# Patient Record
Sex: Male | Born: 1980 | Race: White | Hispanic: No | Marital: Married | Smoking: Never smoker
Health system: Southern US, Community
[De-identification: ages and names within clinical notes are randomized; demographics above are authoritative.]

## PROBLEM LIST (undated history)

## (undated) DIAGNOSIS — I10 Essential (primary) hypertension: Secondary | ICD-10-CM

## (undated) DIAGNOSIS — E7800 Pure hypercholesterolemia, unspecified: Secondary | ICD-10-CM

## (undated) DIAGNOSIS — C9590 Leukemia, unspecified not having achieved remission: Secondary | ICD-10-CM

---

## 2017-08-23 ENCOUNTER — Encounter: Payer: Self-pay | Admitting: Hematology and Oncology

## 2017-08-23 ENCOUNTER — Telehealth: Payer: Self-pay | Admitting: Hematology and Oncology

## 2017-08-23 NOTE — Telephone Encounter (Signed)
Appt has been scheduled for the pt to see Dr. Lindi Adie on 3/21 at 345pm. Pt's address has been verified. Letter mailed.

## 2017-09-21 ENCOUNTER — Inpatient Hospital Stay: Payer: Managed Care, Other (non HMO)

## 2017-09-21 ENCOUNTER — Telehealth: Payer: Self-pay | Admitting: Hematology and Oncology

## 2017-09-21 ENCOUNTER — Inpatient Hospital Stay: Payer: Managed Care, Other (non HMO) | Attending: Hematology and Oncology | Admitting: Hematology and Oncology

## 2017-09-21 DIAGNOSIS — D72820 Lymphocytosis (symptomatic): Secondary | ICD-10-CM | POA: Diagnosis not present

## 2017-09-21 DIAGNOSIS — E669 Obesity, unspecified: Secondary | ICD-10-CM | POA: Diagnosis not present

## 2017-09-21 DIAGNOSIS — F418 Other specified anxiety disorders: Secondary | ICD-10-CM

## 2017-09-21 DIAGNOSIS — Z79899 Other long term (current) drug therapy: Secondary | ICD-10-CM | POA: Diagnosis not present

## 2017-09-21 DIAGNOSIS — E7800 Pure hypercholesterolemia, unspecified: Secondary | ICD-10-CM

## 2017-09-21 DIAGNOSIS — I10 Essential (primary) hypertension: Secondary | ICD-10-CM

## 2017-09-21 DIAGNOSIS — R599 Enlarged lymph nodes, unspecified: Secondary | ICD-10-CM | POA: Diagnosis not present

## 2017-09-21 LAB — CBC WITH DIFFERENTIAL (CANCER CENTER ONLY)
BASOS PCT: 0 %
Basophils Absolute: 0 10*3/uL (ref 0.0–0.1)
EOS ABS: 0.3 10*3/uL (ref 0.0–0.5)
Eosinophils Relative: 1 %
HEMATOCRIT: 45.9 % (ref 38.4–49.9)
Hemoglobin: 15.2 g/dL (ref 13.0–17.1)
Lymphocytes Relative: 68 %
Lymphs Abs: 15.9 10*3/uL — ABNORMAL HIGH (ref 0.9–3.3)
MCH: 29.7 pg (ref 27.2–33.4)
MCHC: 33.1 g/dL (ref 32.0–36.0)
MCV: 89.7 fL (ref 79.3–98.0)
MONO ABS: 1.1 10*3/uL — AB (ref 0.1–0.9)
MONOS PCT: 5 %
NEUTROS ABS: 6.1 10*3/uL (ref 1.5–6.5)
Neutrophils Relative %: 26 %
PLATELETS: 250 10*3/uL (ref 140–400)
RBC: 5.12 MIL/uL (ref 4.20–5.82)
RDW: 13.5 % (ref 11.0–14.6)
WBC Count: 23.4 10*3/uL — ABNORMAL HIGH (ref 4.0–10.3)

## 2017-09-21 NOTE — Progress Notes (Signed)
[facility] NOTE  Patient Care Team: [name], MD as PCP - General (Family Medicine)  CHIEF COMPLAINTS/PURPOSE OF CONSULTATION:  Lymphocytosis  HISTORY OF PRESENTING ILLNESS:  Jordan Schroeder 37 y.o. male is here because of recent diagnosis of elevated white blood cell count primarily lymphocytosis.  Patient was asymptomatic and went for primary care checkup and was found to have elevated WBC count.  He had repeat testing and was consistently shown to have elevated white blood cells.  He also has mildly enlarged cervical lymph nodes.  Because of this he was referred to [place] for further evaluation.  He denies any fevers chills night sweats or weight loss.  I reviewed her records extensively and collaborated the history with the patient.  MEDICAL HISTORY: Obesity, hypertension, hypercholesterolemia, depression, generalized anxiety disorder SURGICAL HISTORY: No prior surgeries SOCIAL HISTORY: Denies any tobacco alcohol or recreational drug use FAMILY HISTORY: No family history of any cancers or blood disorders ALLERGIES:  has no allergies on file. MEDICATIONS: Fenofibrate 160 mg daily, atorvastatin 10 mg daily, lisinopril 10 mg daily, Septra 100 mg daily REVIEW OF SYSTEMS:    Constitutional: Denies fevers, chills or abnormal night sweats Eyes: Denies blurriness of vision, double vision or watery eyes Ears, nose, mouth, throat, and face: Denies mucositis or sore throat Respiratory: Denies cough, dyspnea or wheezes Cardiovascular: Denies palpitation, chest discomfort or lower extremity swelling Gastrointestinal:  Denies nausea, heartburn or change in bowel habits Skin: Denies abnormal skin rashes Lymphatics: Denies new lymphadenopathy or easy bruising Neurological:Denies numbness, tingling or new weaknesses Behavioral/Psych: Mood is stable, no new changes   All other systems were reviewed with the patient and are negative.  PHYSICAL EXAMINATION: ECOG  PERFORMANCE STATUS: 1 - Symptomatic but completely ambulatory  Vitals:   09/21/17 1557  BP: (!) 146/92  Pulse: 93  Resp: 18  Temp: 98.3 F (36.8 C)  SpO2: 97%   Filed Weights   09/21/17 1557  Weight: 285 lb (129.3 kg)    GENERAL:alert, no distress and comfortable SKIN: skin color, texture, turgor are normal, no rashes or significant lesions EYES: normal, conjunctiva are pink and non-injected, sclera clear OROPHARYNX:no exudate, no erythema and lips, buccal mucosa, and tongue normal  NECK: supple, thyroid normal size, non-tender, without nodularity LYMPH: Small left cervical lymph node is palpable l LUNGS: clear to auscultation and percussion with normal breathing effort HEART: regular rate & rhythm and no murmurs and no lower extremity edema ABDOMEN:abdomen soft, non-tender and normal bowel sounds Musculoskeletal:no cyanosis of digits and no clubbing  PSYCH: alert & oriented x 3 with fluent speech NEURO: no focal motor/sensory deficits  LABORATORY DATA:  I have reviewed the data as listed Lab Results  Component Value Date   WBC 23.4 (H) 09/21/2017   HCT 45.9 09/21/2017   MCV 89.7 09/21/2017   PLT 250 09/21/2017   No results found for: NA, K, CL, CO2  RADIOGRAPHIC STUDIES: I have personally reviewed the radiological reports and agreed with the findings in the report.  ASSESSMENT AND PLAN:  Lymphocytosis Lymphocytosis:  08/02/2016: WBC 11.4 with ALC 6.3 08/07/2017: WBC 18.6 with ALC 12.6 08/16/2017: WBC 20.3 ALC 13.3, hemoglobin 15.4, platelets 305 09/21/2017: WBC 23.4 with ANC of 15.9 I discussed with the patient the most common etiology behind lymphocytosis that is asymptomatic is most likely chronic lymphocytic leukemia.  Plan: 1.  Flow cytometry for B cell subsets 2. FISH panel for CLL  I discussed the patient with CLL is an indolent leukemia one that  does not require treatment unless patient is symptomatic. Indications for treatment include 1.  Rapid doubling  of lymphocytes less than 3 months 2. B symptoms of fever chills night sweats weight loss 3.  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 Prognosis of CLL would be determined based upon the [facility] panel.  Return to clinic in 2-3 weeks to discuss results of testing done today.   All questions were answered. The patient knows to call the clinic with any problems, questions or concerns.    Harriette Ohara, MD 09/21/17

## 2017-09-21 NOTE — Assessment & Plan Note (Signed)
Lymphocytosis: 08/16/2017: WBC 20.3 ALC 13.3, hemoglobin 15.4, platelets 305 08/02/2016: WBC 11.4 with ALC 6.3 08/07/2017: WBC 18.6 with ALC 12.6  I discussed with the patient the most common etiology behind lymphocytosis that is asymptomatic is most likely chronic lymphocytic leukemia.  Plan: 1.  Flow cytometry for B cell subsets 2. FISH panel for CLL  I discussed the patient with CLL is an indolent leukemia one that does not require treatment unless patient is symptomatic. Indications for treatment include 1.  Rapid doubling of lymphocytes less than 3 months 2. B symptoms of fever chills night sweats weight loss 3.  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 Prognosis of CLL would be determined based upon the [facility] panel.  Return to clinic in 3 weeks to discuss results of testing done today.

## 2017-09-21 NOTE — Telephone Encounter (Signed)
Gave avs and calendar ° °

## 2017-09-22 LAB — LACTATE DEHYDROGENASE: LDH: 214 U/L (ref 125–245)

## 2017-09-26 LAB — FLOW CYTOMETRY

## 2017-10-05 ENCOUNTER — Telehealth: Payer: Self-pay | Admitting: Hematology

## 2017-10-05 ENCOUNTER — Inpatient Hospital Stay: Payer: Managed Care, Other (non HMO)

## 2017-10-05 ENCOUNTER — Telehealth: Payer: Self-pay | Admitting: Hematology and Oncology

## 2017-10-05 ENCOUNTER — Inpatient Hospital Stay: Payer: Managed Care, Other (non HMO) | Attending: Hematology and Oncology | Admitting: Hematology and Oncology

## 2017-10-05 DIAGNOSIS — D696 Thrombocytopenia, unspecified: Secondary | ICD-10-CM

## 2017-10-05 DIAGNOSIS — R634 Abnormal weight loss: Secondary | ICD-10-CM

## 2017-10-05 DIAGNOSIS — Z79899 Other long term (current) drug therapy: Secondary | ICD-10-CM | POA: Diagnosis not present

## 2017-10-05 DIAGNOSIS — D72820 Lymphocytosis (symptomatic): Secondary | ICD-10-CM | POA: Diagnosis not present

## 2017-10-05 DIAGNOSIS — R509 Fever, unspecified: Secondary | ICD-10-CM | POA: Diagnosis not present

## 2017-10-05 DIAGNOSIS — R590 Localized enlarged lymph nodes: Secondary | ICD-10-CM | POA: Insufficient documentation

## 2017-10-05 DIAGNOSIS — I10 Essential (primary) hypertension: Secondary | ICD-10-CM | POA: Diagnosis not present

## 2017-10-05 DIAGNOSIS — D649 Anemia, unspecified: Secondary | ICD-10-CM | POA: Diagnosis not present

## 2017-10-05 DIAGNOSIS — C9110 Chronic lymphocytic leukemia of B-cell type not having achieved remission: Secondary | ICD-10-CM | POA: Diagnosis not present

## 2017-10-05 DIAGNOSIS — R61 Generalized hyperhidrosis: Secondary | ICD-10-CM | POA: Diagnosis not present

## 2017-10-05 NOTE — Telephone Encounter (Signed)
Spoke to patient regarding upcoming April appointments. Appointment 4/12 ok per provider.

## 2017-10-05 NOTE — Progress Notes (Signed)
Patient Care Team: [name], MD as PCP - General (Family Medicine)  DIAGNOSIS:  Encounter Diagnosis  Name Primary?  . Lymphocytosis     CHIEF COMPLIANT: Follow-up to discuss the results of recently performed blood work  INTERVAL HISTORY: Jordan Schroeder is a 37 year old gentleman with elevation of lymphocyte count whom we performed blood work including flow cytometry and is here today to discuss the results.  The flow cytometry revealed that he had a monoclonal B-cell population that had CD20 and CD5 positivity suggestive of CLL versus mantle cell lymphoma.  He is here accompanied by his family to discuss the results.  REVIEW OF SYSTEMS:   Constitutional: Denies fevers, chills or abnormal weight loss Eyes: Denies blurriness of vision Ears, nose, mouth, throat, and face: Denies mucositis or sore throat Respiratory: Denies cough, dyspnea or wheezes Cardiovascular: Denies palpitation, chest discomfort Gastrointestinal:  Denies nausea, heartburn or change in bowel habits Skin: Denies abnormal skin rashes Lymphatics: Denies new lymphadenopathy or easy bruising Neurological:Denies numbness, tingling or new weaknesses Behavioral/Psych: Mood is stable, no new changes  Extremities: No lower extremity edema  All other systems were reviewed with the patient and are negative.  I have reviewed the past medical history, past surgical history, social history and family history with the patient and they are unchanged from previous note.  ALLERGIES:  has no allergies on file.  MEDICATIONS:  No current outpatient medications on file.   No current facility-administered medications for this visit.     PHYSICAL EXAMINATION: ECOG PERFORMANCE STATUS: 1 - Symptomatic but completely ambulatory  Vitals:   10/05/17 1133  BP: 114/84  Pulse: 88  Resp: 18  Temp: 98.4 F (36.9 C)  SpO2: 98%   Filed Weights   10/05/17 1133  Weight: 281 lb 8 oz (127.7 kg)    GENERAL:alert, no  distress and comfortable SKIN: skin color, texture, turgor are normal, no rashes or significant lesions EYES: normal, Conjunctiva are pink and non-injected, sclera clear OROPHARYNX:no exudate, no erythema and lips, buccal mucosa, and tongue normal  NECK: supple, thyroid normal size, non-tender, without nodularity LYMPH:  no palpable lymphadenopathy in the cervical, axillary or inguinal LUNGS: clear to auscultation and percussion with normal breathing effort HEART: regular rate & rhythm and no murmurs and no lower extremity edema ABDOMEN:abdomen soft, non-tender and normal bowel sounds MUSCULOSKELETAL:no cyanosis of digits and no clubbing  NEURO: alert & oriented x 3 with fluent speech, no focal motor/sensory deficits EXTREMITIES: No lower extremity edema   LABORATORY DATA:  I have reviewed the data as listed No flowsheet data found.  Lab Results  Component Value Date   WBC 23.4 (H) 09/21/2017   HCT 45.9 09/21/2017   MCV 89.7 09/21/2017   PLT 250 09/21/2017   NEUTROABS 6.1 09/21/2017    ASSESSMENT & PLAN:  Lymphocytosis 08/02/2016: WBC 11.4 with ALC 6.3 08/07/2017: WBC 18.6 with ALC 12.6 08/16/2017: WBC 20.3 ALC 13.3, hemoglobin 15.4, platelets 305 09/21/2017: WBC 23.4 with ANC of 15.9 I discussed with the patient the most common etiology behind lymphocytosis that is asymptomatic is most likely chronic lymphocytic leukemia.  Plan: 1.  Flow cytometry for B cell subsets: Lymphoid population is monoclonal kappa restricted expressing B-cell antigens CD20, CD23 and CD5.  CD10 negative.  Consistent with CLL or mantle cell lymphoma. 2. FISH panel for CLL: Pending 3. In order to differentiate CLL and mantle cell lymphoma, I ordered FISH panel for 11:14 translocation. 4. I also ordered for CT chest abdomen pelvi  For lymphoma  staging   I discussed the patient with CLL/ most MCLs are an indolent that may not require treatment unless patient is symptomatic. Indications for treatment  include 1.  Rapid doubling of lymphocytes less than 3 months 2. B symptoms of fever chills night sweats weight loss 3.  Anemia and thrombocytopenia  I discussed the case with Dr.Kale who agreed to see the patient after he undergoes the CT scans and additional blood work. Patient will be followed by Dr. Irene Limbo in the future.    Orders Placed This Encounter  Procedures  . CT Abdomen Pelvis W Contrast    Standing Status:   Future    Standing Expiration Date:   10/05/2018    Order Specific Question:   If indicated for the ordered procedure, I authorize the administration of contrast media per Radiology protocol    Answer:   Yes    Order Specific Question:   Preferred imaging location?    Answer:   [facility]    Order Specific Question:   Call Results- Best Contact Number?    Answer:   Lymphoma staging    Order Specific Question:   Radiology Contrast Protocol - do NOT remove file path    Answer:   \\[email or web address]  . CT Chest W Contrast    Standing Status:   Future    Standing Expiration Date:   10/05/2018    Order Specific Question:   If indicated for the ordered procedure, I authorize the administration of contrast media per Radiology protocol    Answer:   Yes    Order Specific Question:   Preferred imaging location?    Answer:   [facility]    Order Specific Question:   Radiology Contrast Protocol - do NOT remove file path    Answer:   [email or web address]    Order Specific Question:   Reason for Exam additional comments    Answer:   Lymphoma staging  . [facility]    For Mantle cell Lymphoma    Standing Status:   Future    Number of Occurrences:   1    Standing Expiration Date:   10/06/2018   The patient has a good understanding of the overall plan. he agrees with it. he will call with any problems that may develop before the next visit here.   Harriette Ohara, MD 10/05/17

## 2017-10-05 NOTE — Telephone Encounter (Signed)
Gave patient AVs and calendar of upcoming April appointments. Patient will be scheduled with provider per 4/4 los at next avail appointment.

## 2017-10-05 NOTE — Assessment & Plan Note (Signed)
08/02/2016: WBC 11.4 with ALC 6.3 08/07/2017: WBC 18.6 with ALC 12.6 08/16/2017: WBC 20.3 ALC 13.3, hemoglobin 15.4, platelets 305 09/21/2017: WBC 23.4 with ANC of 15.9 I discussed with the patient the most common etiology behind lymphocytosis that is asymptomatic is most likely chronic lymphocytic leukemia.  Plan: 1.  Flow cytometry for B cell subsets: Lymphoid population is monoclonal kappa restricted expressing B-cell antigens CD20, CD23 and CD5.  CD10 negative.  Consistent with CLL or mantle cell lymphoma. 2. FISH panel for CLL: Pending  I discussed the patient with CLL is an indolent leukemia one that does not require treatment unless patient is symptomatic. Indications for treatment include 1.  Rapid doubling of lymphocytes less than 3 months 2. B symptoms of fever chills night sweats weight loss 3.  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 Prognosis of CLL would be determined based upon the [facility] panel.  Treatment plan: Watchful monitoring Return to clinic in 3 months with labs and follow-up  Return to clinic in 2-3 weeks to discuss results of testing done today.

## 2017-10-11 ENCOUNTER — Encounter (HOSPITAL_COMMUNITY): Payer: Self-pay

## 2017-10-11 ENCOUNTER — Ambulatory Visit (HOSPITAL_COMMUNITY)
Admission: RE | Admit: 2017-10-11 | Discharge: 2017-10-11 | Disposition: A | Discharge status: Home / Self Care | Payer: Managed Care, Other (non HMO) | Source: Ambulatory Visit | Attending: Hematology and Oncology | Admitting: Hematology and Oncology

## 2017-10-11 DIAGNOSIS — R590 Localized enlarged lymph nodes: Secondary | ICD-10-CM | POA: Insufficient documentation

## 2017-10-11 DIAGNOSIS — D72820 Lymphocytosis (symptomatic): Secondary | ICD-10-CM | POA: Insufficient documentation

## 2017-10-11 HISTORY — DX: Essential (primary) hypertension: I10

## 2017-10-11 MED ORDER — IOHEXOL 300 MG/ML  SOLN
100.0000 mL | Freq: Once | INTRAMUSCULAR | Status: AC | PRN
  Administered 2017-10-11: 100 mL via INTRAVENOUS

## 2017-10-12 NOTE — Progress Notes (Signed)
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 He is accompanied today by his mother. The pt reports that he is doing well overall.   The pt reports that he has not had any significant medical problems in the past. He notes that on an annual physical, his PCP noticed his WBC was high, and after repeat blood tests, he was referred to Dr. Nicholas Lose. Dr. Lindi Adie has already begun the initial work ups as noted below. The pt notes that he does not notice any change in his energy levels or feeling any different over the last 6 months.   He notes that he has some concerns for sleep apnea as he snores a lot at night but does not wake up due to SOB.   Of note prior to the patient's visit today, pt has had CT C/A/P completed on 10/11/17 with results revealing Lymphadenopathy throughout the chest, abdomen, and pelvis.   On 09/21/17 the pt also had a Flow Cytometry revealing MONOCLONAL B-CELL POPULATION IDENTIFIED.  Most recent lab results (09/21/17) of CBC  is as follows: all values are WNL except for WBC at 23.4k, Lymphs Abs at 15.9k, Monocytes Abs at 1.1k. LDH 09/21/17 was WNL at 214. FISH 09/21/12 is pending.  [facility] 10/05/17 revealed CEP 12 at 100%,  13q14.3 is at 47.67% for normal nuclei, and 47.99% with mono allelic deletion of [id number]. CL TP53 for 17p13 revealed 100% with normal nuclei.  ATM for 11q22.3 revealed 81.67% with normal nuclei and 18.33% with positive nuclei for del 11q22.3  On review of systems, pt reports some lower abdominal pain, good energy levels, and denies fevers, chills, night sweats, weight loss, diarrhea, blood in  the stools, black stools, problems passing urine, and any other symptoms.   On PMHx the pt reports HTN. . On Social Hx the pt denies smoking at any point, any ETOH consumption, and illicit drug use.    MEDICAL HISTORY:  Past Medical History:  Diagnosis Date  . Hypertension     SURGICAL HISTORY: No past surgical history on file.  SOCIAL HISTORY: Social History   Socioeconomic History  . Marital status: Married    Spouse name: Not on file  . Number of children: Not on file  . Years of education: Not on file  . Highest education level: Not on file  Occupational History  . Not on file  Social Needs  . Financial resource strain: Not on file  . Food insecurity:    Worry: Not on file    Inability: Not on file  . Transportation needs:    Medical: Not on file    Non-medical: Not on file  Tobacco Use  . Smoking status: Not on file  Substance and Sexual Activity  . Alcohol use: Not on file  . Drug use: Not on file  . Sexual activity: Not on file  Lifestyle  . Physical activity:    Days per week: Not on file    Minutes per session: Not on file  . Stress: Not on file  Relationships  . Social connections:    Talks on phone: Not on file  Gets together: Not on file    Attends religious service: Not on file    Active member of club or organization: Not on file    Attends meetings of clubs or organizations: Not on file    Relationship status: Not on file  . Intimate partner violence:    Fear of current or ex partner: Not on file    Emotionally abused: Not on file    Physically abused: Not on file    Forced sexual activity: Not on file  Other Topics Concern  . Not on file  Social History Narrative  . Not on file    FAMILY HISTORY: No family history on file.  ALLERGIES:  has No Known Allergies.  MEDICATIONS:  Current Outpatient Medications  Medication Sig Dispense Refill  . lisinopril (PRINIVIL,ZESTRIL) 10 MG tablet Take 10 mg by mouth daily.    . sertraline  (ZOLOFT) 100 MG tablet Take 100 mg by mouth daily.     No current facility-administered medications for this visit.     REVIEW OF SYSTEMS:    10 Point review of Systems was done is negative except as noted above.  PHYSICAL EXAMINATION: ECOG PERFORMANCE STATUS: 1 - Symptomatic but completely ambulatory  . Vitals:   10/13/17 1249  BP: 132/86  Pulse: 90  Resp: 19  Temp: 98.4 F (36.9 C)  SpO2: 95%   Filed Weights   10/13/17 1249  Weight: 282 lb 9.6 oz (128.2 kg)   .Body mass index is 42.97 kg/m.  GENERAL:alert, in no acute distress and comfortable SKIN: no acute rashes, no significant lesions EYES: conjunctiva are pink and non-injected, sclera anicteric OROPHARYNX: MMM, no exudates, no oropharyngeal erythema or ulceration NECK: supple, no JVD LYMPH:  no palpable lymphadenopathy in the cervical, axillary or inguinal regions LUNGS: clear to auscultation b/l with normal respiratory effort HEART: regular rate & rhythm ABDOMEN:  normoactive bowel sounds , non tender, not distended. Extremity: no pedal edema PSYCH: alert & oriented x 3 with fluent speech NEURO: no focal motor/sensory deficits  LABORATORY DATA:  I have reviewed the data as listed  . CBC Latest Ref Rng & Units 09/21/2017  WBC 4.0 - 10.3 K/uL 23.4(H)  Hematocrit 38.4 - 49.9 % 45.9  Platelets 140 - 400 K/uL 250    .No flowsheet data found.  09/21/17 Bx  RADIOGRAPHIC STUDIES: I have personally reviewed the radiological images as listed and agreed with the findings in the report. 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 Lymphocytosis. EXAM: CT CHEST, ABDOMEN, AND PELVIS WITH CONTRAST TECHNIQUE: Multidetector CT imaging of the chest, abdomen and pelvis was performed following the standard protocol during bolus administration of intravenous contrast. CONTRAST:  180m OMNIPAQUE IOHEXOL 300 MG/ML  SOLN COMPARISON:  None.  FINDINGS: CT CHEST FINDINGS Cardiovascular: No acute findings. Mediastinum/Lymph Nodes: Mild lymphadenopathy is seen in the bilateral supraclavicular and axillary regions, mediastinum, and right hilum. Lungs/Pleura: No pulmonary infiltrate or mass identified. No effusion present. Musculoskeletal:  No suspicious bone lesions identified. CT ABDOMEN AND PELVIS FINDINGS Hepatobiliary: No masses identified. Gallbladder is unremarkable. Pancreas:  No mass or inflammatory changes. Spleen:  Within normal limits in size and appearance. Adrenals/Urinary tract:  No masses or hydronephrosis. Stomach/Bowel: No evidence of obstruction, inflammatory process, or abnormal fluid collections. Vascular/Lymphatic: Bulky lymphadenopathy is seen in the porta hepatis, gastrohepatic ligament, and peripancreatic regions. Largest lymph node in the right upper quadrant measures 8.9 cm in short axis on  image 73/2. Mild lymphadenopathy is also seen in the gastrosplenic ligament, small bowel mesentery, and retroperitoneum in the aortocaval and left paraaortic regions. Pelvic lymphadenopathy is also seen throughout the bilateral iliac chains, with largest index lymph node in the left iliac chain measuring 3.5 cm in short axis on image 116/2. Multiple small bilateral inguinal lymph nodes are also noted. Reproductive:  No mass or other significant abnormality identified. Other:  None. Musculoskeletal:  No suspicious bone lesions identified. IMPRESSION: Lymphadenopathy throughout the chest, abdomen, and pelvis, as described above. Electronically Signed   By: Earle Gell M.D.   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 Lymphocytosis. EXAM: CT CHEST, ABDOMEN, AND PELVIS WITH CONTRAST TECHNIQUE: Multidetector CT imaging of the chest, abdomen and pelvis was performed following the standard protocol during bolus administration of  intravenous contrast. CONTRAST:  161m OMNIPAQUE IOHEXOL 300 MG/ML  SOLN COMPARISON:  None. FINDINGS: CT CHEST FINDINGS Cardiovascular: No acute findings. Mediastinum/Lymph Nodes: Mild lymphadenopathy is seen in the bilateral supraclavicular and axillary regions, mediastinum, and right hilum. Lungs/Pleura: No pulmonary infiltrate or mass identified. No effusion present. Musculoskeletal:  No suspicious bone lesions identified. CT ABDOMEN AND PELVIS FINDINGS Hepatobiliary: No masses identified. Gallbladder is unremarkable. Pancreas:  No mass or inflammatory changes. Spleen:  Within normal limits in size and appearance. Adrenals/Urinary tract:  No masses or hydronephrosis. Stomach/Bowel: No evidence of obstruction, inflammatory process, or abnormal fluid collections. Vascular/Lymphatic: Bulky lymphadenopathy is seen in the porta hepatis, gastrohepatic ligament, and peripancreatic regions. Largest lymph node in the right upper quadrant measures 8.9 cm in short axis on image 73/2. Mild lymphadenopathy is also seen in the gastrosplenic ligament, small bowel mesentery, and retroperitoneum in the aortocaval and left paraaortic regions. Pelvic lymphadenopathy is also seen throughout the bilateral iliac chains, with largest index lymph node in the left iliac chain measuring 3.5 cm in short axis on image 116/2. Multiple small bilateral inguinal lymph nodes are also noted. Reproductive:  No mass or other significant abnormality identified. Other:  None. Musculoskeletal:  No suspicious bone lesions identified. IMPRESSION: Lymphadenopathy throughout the chest, abdomen, and pelvis, as described above. Electronically Signed   By: JEarle GellM.D.   On: 10/11/2017 16:36    ASSESSMENT & PLAN:  37y.o. male with  1. 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  -Reviewed 10/11/17 CT of C/A/P which revealed lymphadenopathy throughout the chest, abdomen and pelvis. Most notably in the axilla and abdomen.  PLAN -Would recommend a [place] biopsy -Would recommend a PET/CT to get a baseline -Discussed that if FISH for Cyclin D pending to r/o MCL   -Noted 13q deletion: 13q14.3 is at 47.67% for normal nuclei, and 316.10%with mono allelic deletion of [id number]  -ATM for 11q22.3 revealed 81.67% with normal nuclei and 18.33% with positive nuclei for del 11q22.3 ---> point more to CLL/SLL  -Will refer pt to rad onc for LN biopsy -Will evaluate for hepatitis and HIV from a treatment planning standpoint   [place] biopsy of supraclavicular or axillary LN in 1 week PET/CT in 1 week RTC with Dr KIrene Limboon 10/30/2017 with above results with labs (ok to overbook)   All of  the patients questions were answered with apparent satisfaction. The patient knows to call the clinic with any problems, questions or concerns.  I spent 30 minutes counseling the patient face to face. The total time spent in the appointment was 40 minutes and more than 50% was on counseling and direct patient cares.    Sullivan Lone MD MS AAHIVMS [facility] Hematology/Oncology Physician [facility]  (Office):       [phone] (Work cell):  [phone] (Fax):           [phone]  10/13/2017 12:49 PM  This document serves as a record of services personally performed by Sullivan Lone, MD. It was created on his behalf by Baldwin Jamaica, a trained medical scribe. The creation of this record is based on the scribe's personal observations and the provider's statements to them.   .I have reviewed the above documentation for accuracy and completeness, and I agree with the above. Brunetta Genera MD MS

## 2017-10-13 ENCOUNTER — Inpatient Hospital Stay (HOSPITAL_BASED_OUTPATIENT_CLINIC_OR_DEPARTMENT_OTHER): Payer: Managed Care, Other (non HMO) | Admitting: Hematology

## 2017-10-13 ENCOUNTER — Telehealth: Payer: Self-pay | Admitting: Hematology

## 2017-10-13 VITALS — BP 132/86 | HR 90 | Temp 98.4°F | Resp 19 | Ht 68.0 in | Wt 282.6 lb

## 2017-10-13 DIAGNOSIS — D649 Anemia, unspecified: Secondary | ICD-10-CM | POA: Diagnosis not present

## 2017-10-13 DIAGNOSIS — I10 Essential (primary) hypertension: Secondary | ICD-10-CM | POA: Diagnosis not present

## 2017-10-13 DIAGNOSIS — D696 Thrombocytopenia, unspecified: Secondary | ICD-10-CM

## 2017-10-13 DIAGNOSIS — Z79899 Other long term (current) drug therapy: Secondary | ICD-10-CM | POA: Diagnosis not present

## 2017-10-13 DIAGNOSIS — D72820 Lymphocytosis (symptomatic): Secondary | ICD-10-CM | POA: Diagnosis not present

## 2017-10-13 DIAGNOSIS — R61 Generalized hyperhidrosis: Secondary | ICD-10-CM

## 2017-10-13 DIAGNOSIS — C9110 Chronic lymphocytic leukemia of B-cell type not having achieved remission: Secondary | ICD-10-CM | POA: Diagnosis not present

## 2017-10-13 DIAGNOSIS — R590 Localized enlarged lymph nodes: Secondary | ICD-10-CM | POA: Diagnosis not present

## 2017-10-13 DIAGNOSIS — C8598 Non-Hodgkin lymphoma, unspecified, lymph nodes of multiple sites: Secondary | ICD-10-CM

## 2017-10-13 DIAGNOSIS — R634 Abnormal weight loss: Secondary | ICD-10-CM

## 2017-10-13 LAB — FISH ONCOLOGY
CELLS ANALYZED: 200
Cells Counted: 200

## 2017-10-13 NOTE — Patient Instructions (Signed)
Thank you for choosing [facility] to provide your oncology and hematology care.  To afford each patient quality time with our providers, please arrive 30 minutes before your scheduled appointment time.  If you arrive late for your appointment, you may be asked to reschedule.  We strive to give you quality time with our providers, and arriving late affects you and other patients whose appointments are after yours.   If you are a no show for multiple scheduled visits, you may be dismissed from the clinic at the providers discretion.    Again, thank you for choosing [facility], our hope is that these requests will decrease the amount of time that you wait before being seen by our physicians.  ______________________________________________________________________  Should you have questions after your visit to the [facility], please contact our office at [phone] between the hours of 8:30 and 4:30 p.m.    Voicemails left after 4:30p.m will not be returned until the following business day.    For prescription refill requests, please have your pharmacy contact us directly.  Please also try to allow 48 hours for prescription requests.    Please contact the scheduling department for questions regarding scheduling.  For scheduling of procedures such as PET scans, CT scans, MRI, Ultrasound, etc please contact central scheduling at [phone].    Resources For Cancer Patients and Caregivers:   Oncolink.org:  A wonderful resource for patients and healthcare providers for information regarding your disease, ways to tract your treatment, what to expect, etc.     American Cancer Society:  800-227-2345  Can help patients locate various types of support and financial assistance  Cancer Care: 1-800-813-HOPE (4673) Provides financial assistance, online support groups, medication/co-pay assistance.    [place] DSS:  336-641-3447 Where to apply for food  stamps, Medicaid, and utility assistance  Medicare Rights Center: 800-333-4114 Helps people with Medicare understand their rights and benefits, navigate the Medicare system, and secure the quality healthcare they deserve  SCAT: 336-333-6589 [place] [employer]'s shared-ride transportation service for eligible riders who have a disability that prevents them from riding the fixed route bus.    For additional information on assistance programs please contact our social worker:   Grier Hock/Abigail Elmore:  [phone]            

## 2017-10-13 NOTE — Telephone Encounter (Signed)
Gave patient avs and calendar with appts per 4/12 los. °

## 2017-10-20 ENCOUNTER — Ambulatory Visit (HOSPITAL_COMMUNITY): Payer: Managed Care, Other (non HMO)

## 2017-10-24 ENCOUNTER — Other Ambulatory Visit: Payer: Self-pay | Admitting: Radiology

## 2017-10-24 ENCOUNTER — Encounter (HOSPITAL_COMMUNITY): Payer: Managed Care, Other (non HMO)

## 2017-10-25 ENCOUNTER — Encounter (HOSPITAL_COMMUNITY): Payer: Self-pay

## 2017-10-25 ENCOUNTER — Ambulatory Visit (HOSPITAL_COMMUNITY)
Admission: RE | Admit: 2017-10-25 | Discharge: 2017-10-25 | Disposition: A | Discharge status: Home / Self Care | Payer: Managed Care, Other (non HMO) | Source: Ambulatory Visit | Attending: Hematology | Admitting: Hematology

## 2017-10-25 ENCOUNTER — Other Ambulatory Visit: Payer: Self-pay | Admitting: Hematology

## 2017-10-25 DIAGNOSIS — C8598 Non-Hodgkin lymphoma, unspecified, lymph nodes of multiple sites: Secondary | ICD-10-CM

## 2017-10-25 DIAGNOSIS — Z79899 Other long term (current) drug therapy: Secondary | ICD-10-CM | POA: Diagnosis not present

## 2017-10-25 DIAGNOSIS — C8518 Unspecified B-cell lymphoma, lymph nodes of multiple sites: Secondary | ICD-10-CM | POA: Insufficient documentation

## 2017-10-25 DIAGNOSIS — I10 Essential (primary) hypertension: Secondary | ICD-10-CM | POA: Diagnosis not present

## 2017-10-25 LAB — BASIC METABOLIC PANEL
Anion gap: 10 (ref 5–15)
BUN: 16 mg/dL (ref 6–20)
CALCIUM: 9.2 mg/dL (ref 8.9–10.3)
CO2: 25 mmol/L (ref 22–32)
CREATININE: 0.74 mg/dL (ref 0.61–1.24)
Chloride: 105 mmol/L (ref 101–111)
GFR calc non Af Amer: >60 mL/min (ref >60)
GLUCOSE: 97 mg/dL (ref 65–99)
Potassium: 4.3 mmol/L (ref 3.5–5.1)
Sodium: 140 mmol/L (ref 135–145)

## 2017-10-25 LAB — CBC WITH DIFFERENTIAL/PLATELET
BASOS PCT: 0 %
Basophils Absolute: 0 10*3/uL (ref 0.0–0.1)
Eosinophils Absolute: 0.5 10*3/uL (ref 0.0–0.7)
Eosinophils Relative: 2 %
HCT: 48 % (ref 39.0–52.0)
Hemoglobin: 16.1 g/dL (ref 13.0–17.0)
LYMPHS ABS: 19.7 10*3/uL — AB (ref 0.7–4.0)
Lymphocytes Relative: 75 %
MCH: 30.6 pg (ref 26.0–34.0)
MCHC: 33.5 g/dL (ref 30.0–36.0)
MCV: 91.3 fL (ref 78.0–100.0)
MONO ABS: 0.8 10*3/uL (ref 0.1–1.0)
Monocytes Relative: 3 %
NEUTROS ABS: 5.2 10*3/uL (ref 1.7–7.7)
NEUTROS PCT: 20 %
PLATELETS: 243 10*3/uL (ref 150–400)
RBC: 5.26 MIL/uL (ref 4.22–5.81)
RDW: 12.9 % (ref 11.5–15.5)
WBC: 26.2 10*3/uL — ABNORMAL HIGH (ref 4.0–10.5)

## 2017-10-25 LAB — PROTIME-INR
INR: 0.98
PROTHROMBIN TIME: 12.9 s (ref 11.4–15.2)

## 2017-10-25 MED ORDER — LIDOCAINE HCL 1 % IJ SOLN
INTRAMUSCULAR | Status: AC | PRN
  Administered 2017-10-25: 20 mL

## 2017-10-25 MED ORDER — MIDAZOLAM HCL 2 MG/2ML IJ SOLN
INTRAMUSCULAR | Status: AC | PRN
  Administered 2017-10-25 (×2): 2 mg via INTRAVENOUS

## 2017-10-25 MED ORDER — SODIUM CHLORIDE 0.9 % IV SOLN
INTRAVENOUS | Status: DC
  Administered 2017-10-25: 11:00:00 via INTRAVENOUS

## 2017-10-25 MED ORDER — FENTANYL CITRATE (PF) 100 MCG/2ML IJ SOLN
INTRAMUSCULAR | Status: AC
  Filled 2017-10-25: qty 4

## 2017-10-25 MED ORDER — FENTANYL CITRATE (PF) 100 MCG/2ML IJ SOLN
INTRAMUSCULAR | Status: AC | PRN
  Administered 2017-10-25 (×2): 50 ug via INTRAVENOUS

## 2017-10-25 MED ORDER — MIDAZOLAM HCL 2 MG/2ML IJ SOLN
INTRAMUSCULAR | Status: AC
  Filled 2017-10-25: qty 4

## 2017-10-25 NOTE — Discharge Instructions (Signed)
Moderate Conscious Sedation, Adult, Care After These instructions provide you with information about caring for yourself after your procedure. Your health care provider may also give you more specific instructions. Your treatment has been planned according to current medical practices, but problems sometimes occur. Call your health care provider if you have any problems or questions after your procedure. What can I expect after the procedure? After your procedure, it is common:  To feel sleepy for several hours.  To feel clumsy and have poor balance for several hours.  To have poor judgment for several hours.  To vomit if you eat too soon.  Follow these instructions at home: For at least 24 hours after the procedure:   Do not: ? Participate in activities where you could fall or become injured. ? Drive. ? Use heavy machinery. ? Drink alcohol. ? Take sleeping pills or medicines that cause drowsiness. ? Make important decisions or sign legal documents. ? Take care of children on your own.  Rest. Eating and drinking  Follow the diet recommended by your health care provider.  If you vomit: ? Drink water, juice, or soup when you can drink without vomiting. ? Make sure you have little or no nausea before eating solid foods. General instructions  Have a responsible adult stay with you until you are awake and alert.  Take over-the-counter and prescription medicines only as told by your health care provider.  If you smoke, do not smoke without supervision.  Keep all follow-up visits as told by your health care provider. This is important. Contact a health care provider if:  You keep feeling nauseous or you keep vomiting.  You feel light-headed.  You develop a rash.  You have a fever. Get help right away if:  You have trouble breathing. This information is not intended to replace advice given to you by your health care provider. Make sure you discuss any questions you have  with your health care provider. Document Released: 04/10/2013 Document Revised: 11/23/2015 Document Reviewed: 10/10/2015 Elsevier Interactive Patient Education  2018 [employer].   Needle Biopsy, Care After These instructions give you information about caring for yourself after your procedure. Your doctor may also give you more specific instructions. Call your doctor if you have any problems or questions after your procedure. Follow these instructions at home:  Rest as told by your doctor.  Take medicines only as told by your doctor.  There are many different ways to close and cover the biopsy site, including stitches (sutures), skin glue, and adhesive strips. Follow instructions from your doctor about: ? How to take care of your biopsy site. ? When and how you should change your bandage (dressing).  You may remove your dressing tomorrow. ? When you should remove your dressing. ? Removing whatever was used to close your biopsy site.  Check your biopsy site every day for signs of infection. Watch for: ? Redness, swelling, or pain. ? Fluid, blood, or pus. Contact a doctor if:  You have a fever.  You have redness, swelling, or pain at the biopsy site, and it lasts longer than a few days.  You have fluid, blood, or pus coming from the biopsy site.  You feel sick to your stomach (nauseous).  You throw up (vomit). Get help right away if:  You are short of breath.  You have trouble breathing.  Your chest hurts.  You feel dizzy or you pass out (faint).  You have bleeding that does not stop with pressure or  a bandage. °· You cough up blood. °· Your belly (abdomen) hurts. °This information is not intended to replace advice given to you by your health care provider. Make sure you discuss any questions you have with your health care provider. °Document Released: 06/02/2008 Document Revised: 11/26/2015 Document Reviewed: 06/16/2014 °Elsevier Interactive Patient Education © 2018  [employer]vier [employer]. ° °

## 2017-10-25 NOTE — Sedation Documentation (Signed)
Patient is resting comfortably. 

## 2017-10-25 NOTE — Sedation Documentation (Signed)
dsg L groin area intact

## 2017-10-25 NOTE — Progress Notes (Signed)
Chief Complaint: Lymphadenopathy  Referring Physician(s): Brunetta Genera  Supervising Physician: Arne Cleveland  Patient Status: [facility] - Out-pt  History of Present Illness: Griffey Nicasio is a 37 y.o. male with elevated white blood cell count.  Patient was asymptomatic and went for primary care checkup and was found to have elevated WBC count.    He had repeat testing and was consistently shown to have elevated white blood cells.  He also has mildly enlarged cervical lymph nodes.    He was referred to Dr. Irene Limbo for further evaluation.  Dr. 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    He is here today for a lymph node biopsy.  He is NPO. No blood thinners. He feels well today. No fever/chills.  ROS negative.   Past Medical History:  Diagnosis Date  . Hypertension     History reviewed. No pertinent surgical history.  Allergies: Patient has no known allergies.  Medications: Prior to Admission medications   Medication Sig Start Date End Date Taking? Authorizing Provider  lisinopril (PRINIVIL,ZESTRIL) 10 MG tablet Take 10 mg by mouth daily. 08/22/17   [provider]  sertraline (ZOLOFT) 100 MG tablet Take 100 mg by mouth daily. 08/21/17   [provider]     History reviewed. No pertinent family history.  Social History   Socioeconomic History  . Marital status: Married    Spouse name: Not on file  . Number of children: Not on file  . Years of education: Not on file  . Highest education level: Not on file  Occupational History  . Not on file  Social Needs  . Financial resource strain: Not on file  . Food insecurity:    Worry: Not on file    Inability: Not on file  . Transportation needs:    Medical: Not on file    Non-medical: Not on file  Tobacco Use  . Smoking status: Never Smoker  .  Smokeless tobacco: Never Used  Substance and Sexual Activity  . Alcohol use: Not on file  . Drug use: Not on file  . Sexual activity: Not on file  Lifestyle  . Physical activity:    Days per week: Not on file    Minutes per session: Not on file  . Stress: Not on file  Relationships  . Social connections:    Talks on phone: Not on file    Gets together: Not on file    Attends religious service: Not on file    Active member of club or organization: Not on file    Attends meetings of clubs or organizations: Not on file    Relationship status: Not on file  Other Topics Concern  . Not on file  Social History Narrative  . Not on file     Review of Systems: A 12 point ROS discussed and pertinent positives are indicated in the HPI above.  All other systems are negative. Review of Systems  Vital Signs: BP 130/89 (BP Location: Right Arm)   Pulse 99   Temp 98.2 F (36.8 C) (Oral)   Resp 18   SpO2 97%   Physical Exam  Constitutional: He is oriented to person, place, and time.  Obese, NAD  HENT:  Head: Normocephalic and atraumatic.  Eyes: EOM are normal.  Neck: Normal range of motion.  Cardiovascular: Normal rate, regular rhythm and  normal heart sounds.  Pulmonary/Chest: Effort normal and breath sounds normal. No respiratory distress.  Abdominal: Soft. There is no tenderness.  Musculoskeletal: Normal range of motion.  Neurological: He is alert and oriented to person, place, and time.  Skin: Skin is warm and dry.  Psychiatric: He has a normal mood and affect. His behavior is normal. Judgment and thought content normal.  Vitals reviewed.   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 Lymphocytosis. EXAM: CT CHEST, ABDOMEN, AND PELVIS WITH CONTRAST TECHNIQUE: Multidetector CT imaging of the chest, abdomen and pelvis was performed following the standard protocol during bolus  administration of intravenous contrast. CONTRAST:  138mL OMNIPAQUE IOHEXOL 300 MG/ML  SOLN COMPARISON:  None. FINDINGS: CT CHEST FINDINGS Cardiovascular: No acute findings. Mediastinum/Lymph Nodes: Mild lymphadenopathy is seen in the bilateral supraclavicular and axillary regions, mediastinum, and right hilum. Lungs/Pleura: No pulmonary infiltrate or mass identified. No effusion present. Musculoskeletal:  No suspicious bone lesions identified. CT ABDOMEN AND PELVIS FINDINGS Hepatobiliary: No masses identified. Gallbladder is unremarkable. Pancreas:  No mass or inflammatory changes. Spleen:  Within normal limits in size and appearance. Adrenals/Urinary tract:  No masses or hydronephrosis. Stomach/Bowel: No evidence of obstruction, inflammatory process, or abnormal fluid collections. Vascular/Lymphatic: Bulky lymphadenopathy is seen in the porta hepatis, gastrohepatic ligament, and peripancreatic regions. Largest lymph node in the right upper quadrant measures 8.9 cm in short axis on image 73/2. Mild lymphadenopathy is also seen in the gastrosplenic ligament, small bowel mesentery, and retroperitoneum in the aortocaval and left paraaortic regions. Pelvic lymphadenopathy is also seen throughout the bilateral iliac chains, with largest index lymph node in the left iliac chain measuring 3.5 cm in short axis on image 116/2. Multiple small bilateral inguinal lymph nodes are also noted. Reproductive:  No mass or other significant abnormality identified. Other:  None. Musculoskeletal:  No suspicious bone lesions identified. IMPRESSION: Lymphadenopathy throughout the chest, abdomen, and pelvis, as described above. Electronically Signed   By: Earle Gell M.D.   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 Lymphocytosis. EXAM: CT CHEST, ABDOMEN, AND PELVIS WITH CONTRAST TECHNIQUE: Multidetector CT imaging of  the chest, abdomen and pelvis was performed following the standard protocol during bolus administration of intravenous contrast. CONTRAST:  122mL OMNIPAQUE IOHEXOL 300 MG/ML  SOLN COMPARISON:  None. FINDINGS: CT CHEST FINDINGS Cardiovascular: No acute findings. Mediastinum/Lymph Nodes: Mild lymphadenopathy is seen in the bilateral supraclavicular and axillary regions, mediastinum, and right hilum. Lungs/Pleura: No pulmonary infiltrate or mass identified. No effusion present. Musculoskeletal:  No suspicious bone lesions identified. CT ABDOMEN AND PELVIS FINDINGS Hepatobiliary: No masses identified. Gallbladder is unremarkable. Pancreas:  No mass or inflammatory changes. Spleen:  Within normal limits in size and appearance. Adrenals/Urinary tract:  No masses or hydronephrosis. Stomach/Bowel: No evidence of obstruction, inflammatory process, or abnormal fluid collections. Vascular/Lymphatic: Bulky lymphadenopathy is seen in the porta hepatis, gastrohepatic ligament, and peripancreatic regions. Largest lymph node in the right upper quadrant measures 8.9 cm in short axis on image 73/2. Mild lymphadenopathy is also seen in the gastrosplenic ligament, small bowel mesentery, and retroperitoneum in the aortocaval and left paraaortic regions. Pelvic lymphadenopathy is also seen throughout the bilateral iliac chains, with largest index lymph node in the left iliac chain measuring 3.5 cm in short axis on image 116/2. Multiple small bilateral inguinal lymph nodes are also  noted. Reproductive:  No mass or other significant abnormality identified. Other:  None. Musculoskeletal:  No suspicious bone lesions identified. IMPRESSION: Lymphadenopathy throughout the chest, abdomen, and pelvis, as described above. Electronically Signed   By: Earle Gell M.D.   On: 10/11/2017 16:36    Labs:  CBC: Recent Labs    09/21/17 1627  WBC 23.4*  HGB 15.2  HCT 45.9  PLT 250    COAGS: Recent Labs    10/25/17 1106  INR 0.98     BMP: Recent Labs    10/25/17 1106  NA 140  K 4.3  CL 105  CO2 25  GLUCOSE 97  BUN 16  CALCIUM 9.2  CREATININE 0.74  GFRNONAA >60  GFRAA >60    LIVER FUNCTION TESTS: No results for input(s): BILITOT, AST, ALT, ALKPHOS, PROT, ALBUMIN in the last 8760 hours.  TUMOR MARKERS: No results for input(s): AFPTM, CEA, CA199, CHROMGRNA in the last 8760 hours.  Assessment and Plan:  With leukocytosis and lymphadenopathy.  Will proceed with left inguinal lymph node biopsy today by Dr. Vernard Gambles.  Thank you for this interesting consult.  I greatly enjoyed meeting Olie Dibert and look forward to participating in their care.  A copy of this report was sent to the requesting provider on this date.  Electronically Signed: Murrell Redden, PA-C   10/25/2017, 11:55 AM      I spent a total of  30 Minutes in face to face in clinical consultation, greater than 50% of which was counseling/coordinating care for lymph node bipsy

## 2017-10-25 NOTE — Procedures (Signed)
  Procedure: CT core L ext iliac LAN 18g x6 in saline EBL:   minimal Complications:  none immediate  See full dictation in [facility].  Dillard Cannon MD Main # [phone] Pager  [phone]

## 2017-10-30 ENCOUNTER — Inpatient Hospital Stay: Payer: Managed Care, Other (non HMO)

## 2017-10-30 ENCOUNTER — Encounter: Payer: Self-pay | Admitting: Hematology

## 2017-10-30 ENCOUNTER — Inpatient Hospital Stay: Payer: Managed Care, Other (non HMO) | Admitting: Hematology

## 2017-10-30 ENCOUNTER — Telehealth: Payer: Self-pay | Admitting: Hematology

## 2017-10-30 VITALS — BP 128/81 | HR 117 | Temp 99.1°F | Resp 18 | Ht 68.0 in | Wt 281.9 lb

## 2017-10-30 DIAGNOSIS — R61 Generalized hyperhidrosis: Secondary | ICD-10-CM

## 2017-10-30 DIAGNOSIS — C8598 Non-Hodgkin lymphoma, unspecified, lymph nodes of multiple sites: Secondary | ICD-10-CM

## 2017-10-30 DIAGNOSIS — Z79899 Other long term (current) drug therapy: Secondary | ICD-10-CM | POA: Diagnosis not present

## 2017-10-30 DIAGNOSIS — C9110 Chronic lymphocytic leukemia of B-cell type not having achieved remission: Secondary | ICD-10-CM

## 2017-10-30 DIAGNOSIS — R634 Abnormal weight loss: Secondary | ICD-10-CM | POA: Diagnosis not present

## 2017-10-30 DIAGNOSIS — I10 Essential (primary) hypertension: Secondary | ICD-10-CM | POA: Diagnosis not present

## 2017-10-30 DIAGNOSIS — R590 Localized enlarged lymph nodes: Secondary | ICD-10-CM

## 2017-10-30 DIAGNOSIS — D696 Thrombocytopenia, unspecified: Secondary | ICD-10-CM | POA: Diagnosis not present

## 2017-10-30 DIAGNOSIS — Z7189 Other specified counseling: Secondary | ICD-10-CM

## 2017-10-30 DIAGNOSIS — D649 Anemia, unspecified: Secondary | ICD-10-CM | POA: Diagnosis not present

## 2017-10-30 DIAGNOSIS — C8308 Small cell B-cell lymphoma, lymph nodes of multiple sites: Secondary | ICD-10-CM | POA: Insufficient documentation

## 2017-10-30 LAB — CBC WITH DIFFERENTIAL (CANCER CENTER ONLY)
BASOS ABS: 0.1 10*3/uL (ref 0.0–0.1)
Basophils Relative: 0 %
EOS ABS: 0.2 10*3/uL (ref 0.0–0.5)
Eosinophils Relative: 1 %
HEMATOCRIT: 49.1 % (ref 38.4–49.9)
HEMOGLOBIN: 16.4 g/dL (ref 13.0–17.1)
LYMPHS ABS: 11.6 10*3/uL — AB (ref 0.9–3.3)
Lymphocytes Relative: 65 %
MCH: 30.5 pg (ref 27.2–33.4)
MCHC: 33.4 g/dL (ref 32.0–36.0)
MCV: 91.4 fL (ref 79.3–98.0)
Monocytes Absolute: 1.2 10*3/uL — ABNORMAL HIGH (ref 0.1–0.9)
Monocytes Relative: 7 %
NEUTROS ABS: 4.8 10*3/uL (ref 1.5–6.5)
NEUTROS PCT: 27 %
Platelet Count: 217 10*3/uL (ref 140–400)
RBC: 5.37 MIL/uL (ref 4.20–5.82)
RDW: 13.1 % (ref 11.0–14.6)
WBC Count: 17.9 10*3/uL — ABNORMAL HIGH (ref 4.0–10.3)

## 2017-10-30 LAB — CMP (CANCER CENTER ONLY)
ALBUMIN: 4.4 g/dL (ref 3.5–5.0)
ALK PHOS: 82 U/L (ref 40–150)
ALT: 23 U/L (ref 0–55)
ANION GAP: 8 (ref 3–11)
AST: 23 U/L (ref 5–34)
BUN: 12 mg/dL (ref 7–26)
CALCIUM: 10 mg/dL (ref 8.4–10.4)
CO2: 28 mmol/L (ref 22–29)
Chloride: 104 mmol/L (ref 98–109)
Creatinine: 1.18 mg/dL (ref 0.70–1.30)
GFR, Estimated: >60 mL/min (ref >60)
Glucose, Bld: 103 mg/dL (ref 70–140)
POTASSIUM: 4.3 mmol/L (ref 3.5–5.1)
SODIUM: 140 mmol/L (ref 136–145)
Total Bilirubin: 0.6 mg/dL (ref 0.2–1.2)
Total Protein: 7.2 g/dL (ref 6.4–8.3)

## 2017-10-30 LAB — RETICULOCYTES
RBC.: 5.37 MIL/uL (ref 4.20–5.82)
RETIC CT PCT: 1 % (ref 0.8–1.8)
Retic Count, Absolute: 53.7 10*3/uL (ref 34.8–93.9)

## 2017-10-30 MED ORDER — ACYCLOVIR 400 MG PO TABS: 400.0000 mg | ORAL_TABLET | Freq: Every day | ORAL | 3 refills | Status: DC

## 2017-10-30 MED ORDER — DEXAMETHASONE 4 MG PO TABS: 8.0000 mg | ORAL_TABLET | Freq: Every day | ORAL | 1 refills | Status: DC

## 2017-10-30 MED ORDER — ONDANSETRON HCL 8 MG PO TABS: 8.0000 mg | ORAL_TABLET | Freq: Two times a day (BID) | ORAL | 1 refills | Status: DC | PRN

## 2017-10-30 MED ORDER — ALLOPURINOL 300 MG PO TABS: 300.0000 mg | ORAL_TABLET | Freq: Every day | ORAL | 0 refills | Status: DC

## 2017-10-30 MED ORDER — PROCHLORPERAZINE MALEATE 10 MG PO TABS: 10.0000 mg | ORAL_TABLET | Freq: Four times a day (QID) | ORAL | 1 refills | Status: DC | PRN

## 2017-10-30 NOTE — Telephone Encounter (Signed)
Scheduled appt per 4/29 los - unable to schedule treatment due to capped day - logged - will contact patient with appt when added.

## 2017-10-30 NOTE — Progress Notes (Signed)
HEMATOLOGY/ONCOLOGY CLINIC NOTE  Date of Service: 10/13/17  Patient Care Team: [name], MD as PCP - General (Family Medicine)  CHIEF COMPLAINTS/PURPOSE OF CONSULTATION:  F/u for new diagnosis of CLL/SLL  HISTORY OF PRESENTING ILLNESS:   Jordan Schroeder is a wonderful 37 y.o. 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 He is accompanied today by his mother. The pt reports that he is doing well overall.   The pt reports that he has not had any significant medical problems in the past. He notes that on an annual physical, his PCP noticed his WBC was high, and after repeat blood tests, he was referred to Dr. Nicholas Lose. Dr. Lindi Adie has already begun the initial work ups as noted below. The pt notes that he does not notice any change in his energy levels or feeling any different over the last 6 months.   He notes that he has some concerns for sleep apnea as he snores a lot at night but does not wake up due to SOB.   Of note prior to the patient's visit today, pt has had CT C/A/P completed on 10/11/17 with results revealing Lymphadenopathy throughout the chest, abdomen, and pelvis.   On 09/21/17 the pt also had a Flow Cytometry revealing MONOCLONAL B-CELL POPULATION IDENTIFIED.  Most recent lab results (09/21/17) of CBC  is as follows: all values are WNL except for WBC at 23.4k, Lymphs Abs at 15.9k, Monocytes Abs at 1.1k. LDH 09/21/17 was WNL at 214. FISH 09/21/12 is pending.  [facility] 10/05/17 revealed CEP 12 at 100%,  13q14.3 is at 47.67% for normal nuclei, and 25.95% with mono allelic deletion of [id number]. CL TP53 for 17p13 revealed 100% with normal nuclei.  ATM for 11q22.3 revealed 81.67% with normal nuclei and 18.33% with positive nuclei for del 11q22.3  On review of systems, pt reports some lower abdominal pain, good energy levels, and denies fevers, chills, night sweats, weight loss, diarrhea, blood in  the stools, black stools, problems passing urine, and any other symptoms.   On PMHx the pt reports HTN. . On Social Hx the pt denies smoking at any point, any ETOH consumption, and illicit drug use.  Interval History:  Jordan Schroeder returns today regarding his newly diagnosed CLL/SLL. The patient's last visit with [place] was on 10/13/17. He is accompanied today by his sister and mother. The pt reports that he is doing well overall.   The pt reports that he has had no new concerns since his last visit, but notes some difficulty getting his PET/CT scheduled with his insurance.   Of note since the patient's last visit, pt has had Tissue Flow Cytometry completed on 10/25/17 with results revealing MONOCLONAL B-CELL POPULATION IDENTIFIED.  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  Lab results today (10/30/17) of CBC, CMP, and Reticulocytes is as follows: all values are WNL except for WBC at 17.9k, Lymphs Abs at 11.6k, Monocytes Abs at 1.2k. Hepatitis and HIV antibody panels are negative  On review of systems, pt reports stable energy levels, and denies fevers, chills, night sweats, unexpected weight loss, abdominal pains or discomfort, and any other symptoms.    MEDICAL HISTORY:  Past Medical History:  Diagnosis Date  . Hypertension     SURGICAL HISTORY: No past surgical history on file.  SOCIAL HISTORY: Social History  Socioeconomic History  . Marital status: Married    Spouse name: Not on file  . Number of children: Not on file  . Years of education: Not on file  . Highest education level: Not on file  Occupational History  . Not on file  Social Needs  . Financial resource strain: Not on file  . Food insecurity:    Worry: Not on file    Inability: Not on file  . Transportation needs:    Medical: Not on file    Non-medical: Not on file  Tobacco Use  . Smoking status: Never Smoker  .  Smokeless tobacco: Never Used  Substance and Sexual Activity  . Alcohol use: Not on file  . Drug use: Not on file  . Sexual activity: Not on file  Lifestyle  . Physical activity:    Days per week: Not on file    Minutes per session: Not on file  . Stress: Not on file  Relationships  . Social connections:    Talks on phone: Not on file    Gets together: Not on file    Attends religious service: Not on file    Active member of club or organization: Not on file    Attends meetings of clubs or organizations: Not on file    Relationship status: Not on file  . Intimate partner violence:    Fear of current or ex partner: Not on file    Emotionally abused: Not on file    Physically abused: Not on file    Forced sexual activity: Not on file  Other Topics Concern  . Not on file  Social History Narrative  . Not on file    FAMILY HISTORY: No family history on file.  ALLERGIES:  has No Known Allergies.  MEDICATIONS:  Current Outpatient Medications  Medication Sig Dispense Refill  . lisinopril (PRINIVIL,ZESTRIL) 10 MG tablet Take 10 mg by mouth daily.    . sertraline (ZOLOFT) 100 MG tablet Take 100 mg by mouth daily.     No current facility-administered medications for this visit.     REVIEW OF SYSTEMS:    .10 Point review of Systems was done is negative except as noted above.   PHYSICAL EXAMINATION: ECOG PERFORMANCE STATUS: 1 - Symptomatic but completely ambulatory  . Vitals:   10/30/17 1501  BP: 128/81  Pulse: (!) 117  Resp: 18  Temp: 99.1 F (37.3 C)  SpO2: 97%   Filed Weights   10/30/17 1501  Weight: 281 lb 14.4 oz (127.9 kg)   .Body mass index is 42.86 kg/m. Marland Kitchen GENERAL:alert, in no acute distress and comfortable SKIN: no acute rashes, no significant lesions EYES: conjunctiva are pink and non-injected, sclera anicteric OROPHARYNX: MMM, no exudates, no oropharyngeal erythema or ulceration NECK: supple, no JVD LYMPH:  Minimal palpable supraclavicular and  axillary LNadenopathy LUNGS: clear to auscultation b/l with normal respiratory effort HEART: regular rate & rhythm ABDOMEN:  normoactive bowel sounds , non tender, not distended. Extremity: no pedal edema PSYCH: alert & oriented x 3 with fluent speech NEURO: no focal motor/sensory deficits   LABORATORY DATA:  I have reviewed the data as listed  . CBC Latest Ref Rng & Units 10/30/2017 10/25/2017 09/21/2017  WBC 4.0 - 10.3 K/uL 17.9(H) 26.2(H) 23.4(H)  Hemoglobin 13.0 - 17.1 g/dL 16.4 16.1 15.2  Hematocrit 38.4 - 49.9 % 49.1 48.0 45.9  Platelets 140 - 400 K/uL 217 243 250    . CMP Latest Ref Rng & Units 10/30/2017  10/25/2017  Glucose 70 - 140 mg/dL 103 97  BUN 7 - 26 mg/dL 12 16  Creatinine 0.70 - 1.30 mg/dL 1.18 0.74  Sodium 136 - 145 mmol/L 140 140  Potassium 3.5 - 5.1 mmol/L 4.3 4.3  Chloride 98 - 109 mmol/L 104 105  CO2 22 - 29 mmol/L 28 25  Calcium 8.4 - 10.4 mg/dL 10.0 9.2  Total Protein 6.4 - 8.3 g/dL 7.2 -  Total Bilirubin 0.2 - 1.2 mg/dL 0.6 -  Alkaline Phos 40 - 150 U/L 82 -  AST 5 - 34 U/L 23 -  ALT 0 - 55 U/L 23 -   Component     Latest Ref Rng & Units 10/30/2017  HIV Screen 4th Generation wRfx     Non Reactive Non Reactive  HCV Ab     0.0 - 0.9 s/co ratio <0.1  Hepatitis B Surface Ag     Negative Negative  Hep B Core Ab, Tot     Negative Negative      10/25/17 Tissue Flow Cytometry:    10/25/17 Needle/core Bx:   FISH Oncology  Order: [id number]  Status:  Final result Visible to patient:  No (Not Released) Next appt:  11/07/2017 at 09:00 AM in Radiology ([facility] PET) Dx:  Lymphocytosis  Component 9moago  Specimen Type Comment:   Comment: BLOOD  Cells Counted 200   Cells Analyzed 200   FISH Result Comment:   Comment: NORMAL: NO CYCLIN D1 OR IGH GENE REARRANGEMENT OBSERVED  Interpretation Comment:   Comment: (NOTE)        nuc ish 11q13(CCND1x2),14q32(IGHx2)[200]    The fluorescence in situ hybridization (FISH) result  was normal. Dual  color dual fusion DNA FISH probes targeting  the cyclin D1 gene (CCND1 or BCL1)([employer] and the  heavy chain immunoglobulin gene (IgH) at 14q32 showed two  hybridization signals each with no fusion in all cells  examined. Thus, there was NO apparent rearrangement of the  primary genes involved with mantle cell lymphoma, and to a  lesser extent myeloma. .Marland Kitchen        RADIOGRAPHIC STUDIES: I have personally reviewed the radiological images as listed and agreed with the findings in the report. 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 Lymphocytosis. EXAM: CT CHEST, ABDOMEN, AND PELVIS WITH CONTRAST TECHNIQUE: Multidetector CT imaging of the chest, abdomen and pelvis was performed following the standard protocol during bolus administration of intravenous contrast. CONTRAST:  1024mOMNIPAQUE IOHEXOL 300 MG/ML  SOLN COMPARISON:  None. FINDINGS: CT CHEST FINDINGS Cardiovascular: No acute findings. Mediastinum/Lymph Nodes: Mild lymphadenopathy is seen in the bilateral supraclavicular and axillary regions, mediastinum, and right hilum. Lungs/Pleura: No pulmonary infiltrate or mass identified. No effusion present. Musculoskeletal:  No suspicious bone lesions identified. CT ABDOMEN AND PELVIS FINDINGS Hepatobiliary: No masses identified. Gallbladder is unremarkable. Pancreas:  No mass or inflammatory changes. Spleen:  Within normal limits in size and appearance. Adrenals/Urinary tract:  No masses or hydronephrosis. Stomach/Bowel: No evidence of obstruction, inflammatory process, or abnormal fluid collections. Vascular/Lymphatic: Bulky lymphadenopathy is seen in the porta hepatis, gastrohepatic ligament, and peripancreatic regions. Largest lymph node in the right upper quadrant measures 8.9 cm in short axis on image 73/2. Mild lymphadenopathy is also seen in the gastrosplenic ligament, small bowel mesentery, and  retroperitoneum in the aortocaval and left paraaortic regions. Pelvic lymphadenopathy is also seen throughout the bilateral iliac chains, with largest index lymph node in  the left iliac chain measuring 3.5 cm in short axis on image 116/2. Multiple small bilateral inguinal lymph nodes are also noted. Reproductive:  No mass or other significant abnormality identified. Other:  None. Musculoskeletal:  No suspicious bone lesions identified. IMPRESSION: Lymphadenopathy throughout the chest, abdomen, and pelvis, as described above. Electronically Signed   By: Earle Gell M.D.   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 Lymphocytosis. EXAM: CT CHEST, ABDOMEN, AND PELVIS WITH CONTRAST TECHNIQUE: Multidetector CT imaging of the chest, abdomen and pelvis was performed following the standard protocol during bolus administration of intravenous contrast. CONTRAST:  159m OMNIPAQUE IOHEXOL 300 MG/ML  SOLN COMPARISON:  None. FINDINGS: CT CHEST FINDINGS Cardiovascular: No acute findings. Mediastinum/Lymph Nodes: Mild lymphadenopathy is seen in the bilateral supraclavicular and axillary regions, mediastinum, and right hilum. Lungs/Pleura: No pulmonary infiltrate or mass identified. No effusion present. Musculoskeletal:  No suspicious bone lesions identified. CT ABDOMEN AND PELVIS FINDINGS Hepatobiliary: No masses identified. Gallbladder is unremarkable. Pancreas:  No mass or inflammatory changes. Spleen:  Within normal limits in size and appearance. Adrenals/Urinary tract:  No masses or hydronephrosis. Stomach/Bowel: No evidence of obstruction, inflammatory process, or abnormal fluid collections. Vascular/Lymphatic: Bulky lymphadenopathy is seen in the porta hepatis, gastrohepatic ligament, and peripancreatic regions. Largest lymph node in the right upper quadrant measures 8.9 cm in short axis on image 73/2.  Mild lymphadenopathy is also seen in the gastrosplenic ligament, small bowel mesentery, and retroperitoneum in the aortocaval and left paraaortic regions. Pelvic lymphadenopathy is also seen throughout the bilateral iliac chains, with largest index lymph node in the left iliac chain measuring 3.5 cm in short axis on image 116/2. Multiple small bilateral inguinal lymph nodes are also noted. Reproductive:  No mass or other significant abnormality identified. Other:  None. Musculoskeletal:  No suspicious bone lesions identified. IMPRESSION: Lymphadenopathy throughout the chest, abdomen, and pelvis, as described above. Electronically Signed   By: JEarle GellM.D.   On: 10/11/2017 16:36   Ct Biopsy  Result Date: 10/25/2017 CLINICAL DATA:  Elevated white blood cell count. Multi station adenopathy. EXAM: CT GUIDED CORE BIOPSY OF LEFT EXTERNAL ILIAC ADENOPATHY ANESTHESIA/SEDATION: Intravenous Fentanyl and Versed were administered as conscious sedation during continuous monitoring of the patient's level of consciousness and physiological / cardiorespiratory status by the radiology RN, with a total moderate sedation time of 10 minutes. PROCEDURE: The procedure risks, benefits, and alternatives were explained to the patient. Questions regarding the procedure were encouraged and answered. The patient understands and consents to the procedure. Select axial scans through the pelvis obtained. The left external iliac adenopathy was localized and an appropriate skin entry site was determined and marked. The operative field was prepped with chlorhexidinein a sterile fashion, and a sterile drape was applied covering the operative field. A sterile gown and sterile gloves were used for the procedure. Local anesthesia was provided with 1% Lidocaine. Under CT fluoroscopic guidance, a 17 gauge trocar needle was advanced to the margin of the lesion. Once needle tip position was confirmed, coaxial 18-gauge core biopsy samples were  obtained, submitted in saline to surgical pathology. The guide needle was removed. Postprocedure scans show no hemorrhage or other apparent complication. The patient tolerated the procedure well. COMPLICATIONS: None immediate FINDINGS: Bilateral external iliac adenopathy was identified. Representative core biopsy samples of left external iliac adenopathy obtained as above. IMPRESSION: 1. Technically successful CT-guided core biopsy, left external iliac  adenopathy. Electronically Signed   By: Lucrezia Europe M.D.   On: 10/25/2017 14:51    ASSESSMENT & PLAN:   37 y.o. male with  1. Newly diagnosed Non Hodgkins lymphoma/leukemia --  CLL/SLL we did r/o  Mantle Cell Lymphoma  -Discussed patient's most recent labs from 09/21/17; WBC at 23.4k, Lymphs Abs at 15.9k -Reviewed 09/21/17 Flow Cytometry results with pt which revealed a CD5+ monoclonal B-Cell population.  -Reviewed 10/11/17 CT of C/A/P which revealed lymphadenopathy throughout the chest, abdomen and pelvis. Most notably in the axilla and abdomen.   -Noted 13q deletion: 13q14.3 is at 47.67% for normal nuclei, and 60.45% with mono allelic deletion of [id number].  -ATM for 11q22.3 revealed 81.67% with normal nuclei and 18.33% with positive nuclei for del 11q22.3 ---> point more to CLL/SLL   PLAN -Discussed pt labwork today 10/30/17; no anemia or thromobcytopenia.  -Mantle cell lymphoma ruled out by 10/25/17 pathology as Cyclin D1 expression was not found.  -Hepatitis and HIV antibodies are pending.  -SLL/CLL indicated by 10/25/17 LN needle/core pathology report.  -Extent of lymph node involvement being Stage III would meet criteria for treatment.  -Discussed national cancer guidelines with the pt, his sister, and mother.  -I discussed and recommended Bendamustine and Rituxan.  -Discussed that the pt could choose to receive a port but this is not absolutely necessary.  -Discussed that because the pt is relatively young, he may not need a growth factor but I  will evaluate this need with repeat labs.  -Advised walking for 20-30 minutes each day, eating well, and staying well hydrated. -Will set pt up for chemotherapy counseling.  -Provided supplemental information to the pt and his family. -Allopurinol for C1 to limit tumor lysis, Acyclovir for shingles prevention, Dexamethasone, and nausea mx.  -Will see pt back on C1D10 and then first day of every cycle. -Would recommend a PET/CT to get initial staging for his newly diagnosed SLL  PET/CT in 4-5 days Chemo-counseling for Bendamustine/Rituxan in 4-5 days Start BR in 7 days with labs RTC with Dr Irene Limbo C1 D10 with labs for toxicity check    All of the patients questions were answered with apparent satisfaction. The patient knows to call the clinic with any problems, questions or concerns.  . The total time spent in the appointment was 40 minutes and more than 50% was on counseling and direct patient cares.    Sullivan Lone MD [place] AAHIVMS [facility] Hematology/Oncology Physician [facility]  (Office):       [phone] (Work cell):  [phone] (Fax):           [phone]  10/30/2017 2:53 PM  This document serves as a record of services personally performed by Sullivan Lone, MD. It was created on his behalf by Baldwin Jamaica, a trained medical scribe. The creation of this record is based on the scribe's personal observations and the provider's statements to them.   .I have reviewed the above documentation for accuracy and completeness, and I agree with the above. Brunetta Genera MD MS

## 2017-10-30 NOTE — Progress Notes (Signed)
START ON PATHWAY REGIMEN - Lymphoma and CLL     A cycle is every 28 days:     Bendamustine      Rituximab      Rituximab   **Always confirm dose/schedule in your pharmacy ordering system**    Patient Characteristics: Small Lymphocytic Lymphoma (SLL), First Line, Treatment Indicated, 17p del (-) or Unknown, Fit Patient, Age < 83, IGHV Mutation (-) Disease Type: Small Lymphocytic Lymphoma (SLL) Disease Type: Not Applicable Disease Type: Not Applicable [place] Stage: IV Line of therapy: First Line Treatment Indicated<= Treatment Indicated 17p Deletion Status: Negative Fit or Frail Patient<= Fit Patient Patient Age: Age < 79 IGHV Mutation: IGHV Mutation (-) Intent of Therapy: Non-Curative / Palliative Intent, Discussed with Patient

## 2017-10-31 ENCOUNTER — Inpatient Hospital Stay: Payer: Managed Care, Other (non HMO)

## 2017-10-31 LAB — HEPATITIS C ANTIBODY

## 2017-10-31 LAB — HEPATITIS B CORE ANTIBODY, TOTAL: Hep B Core Total Ab: NEGATIVE

## 2017-10-31 LAB — HIV ANTIBODY (ROUTINE TESTING W REFLEX): HIV Screen 4th Generation wRfx: NONREACTIVE

## 2017-10-31 LAB — HEPATITIS B SURFACE ANTIGEN: Hepatitis B Surface Ag: NEGATIVE

## 2017-11-02 NOTE — Progress Notes (Signed)
Successfully faxed FMLA to [employer] at [phone]. Mailed a copy to patient address on file.

## 2017-11-06 ENCOUNTER — Encounter: Payer: Self-pay | Admitting: Hematology

## 2017-11-06 NOTE — Progress Notes (Signed)
Called pt to introduce myself as his Arboriculturist and to discuss copay assistance.  Pt gave me consent to apply is his behalf so I enrolled him in the [place] program for Rituxan for $25,000 for 12 months from 11/06/17.  I also informed him of the [place] and gave him the income requirements.  He stated  He is overqualified for that grant.  I will meet him on 11/09/17 to give him the approval letter for [facility] and my card for any questions or concerns he may have in the future.

## 2017-11-07 ENCOUNTER — Ambulatory Visit
Admission: RE | Admit: 2017-11-07 | Discharge: 2017-11-07 | Disposition: A | Discharge status: Home / Self Care | Payer: Managed Care, Other (non HMO) | Source: Ambulatory Visit | Attending: Family Medicine | Admitting: Family Medicine

## 2017-11-07 ENCOUNTER — Other Ambulatory Visit: Payer: Self-pay | Admitting: Family Medicine

## 2017-11-07 ENCOUNTER — Ambulatory Visit (HOSPITAL_COMMUNITY)
Admission: RE | Admit: 2017-11-07 | Discharge: 2017-11-07 | Disposition: A | Discharge status: Home / Self Care | Payer: Managed Care, Other (non HMO) | Source: Ambulatory Visit | Attending: Hematology | Admitting: Hematology

## 2017-11-07 DIAGNOSIS — R918 Other nonspecific abnormal finding of lung field: Secondary | ICD-10-CM | POA: Insufficient documentation

## 2017-11-07 DIAGNOSIS — C8308 Small cell B-cell lymphoma, lymph nodes of multiple sites: Secondary | ICD-10-CM | POA: Insufficient documentation

## 2017-11-07 DIAGNOSIS — J189 Pneumonia, unspecified organism: Secondary | ICD-10-CM

## 2017-11-07 LAB — GLUCOSE, CAPILLARY: Glucose-Capillary: 113 mg/dL — ABNORMAL HIGH (ref 65–99)

## 2017-11-07 MED ORDER — FLUDEOXYGLUCOSE F - 18 (FDG) INJECTION
13.7000 | Freq: Once | INTRAVENOUS | Status: AC | PRN
  Administered 2017-11-07: 13.7 via INTRAVENOUS

## 2017-11-08 ENCOUNTER — Telehealth: Payer: Self-pay | Admitting: *Deleted

## 2017-11-08 NOTE — Telephone Encounter (Signed)
Pt called stating just diagnosed with pneumonia.  Scheduled to start chemo tomorrow.  Per Dr. Irene Limbo, will reschedule all appointments for 2 weeks from now, MD visit to be added to schedule.  Patient in agreement with plan.  Message sent to scheduling.

## 2017-11-09 ENCOUNTER — Telehealth: Payer: Self-pay | Admitting: Hematology

## 2017-11-09 ENCOUNTER — Other Ambulatory Visit: Payer: Managed Care, Other (non HMO)

## 2017-11-09 ENCOUNTER — Inpatient Hospital Stay: Payer: Managed Care, Other (non HMO)

## 2017-11-09 LAB — FISH,CLL PROGNOSTIC PANEL

## 2017-11-09 NOTE — Telephone Encounter (Signed)
Scheduled appts per 5/7 sch msg - spoke with pt and confirmed appt.

## 2017-11-10 ENCOUNTER — Ambulatory Visit: Payer: Managed Care, Other (non HMO)

## 2017-11-16 ENCOUNTER — Ambulatory Visit: Payer: Managed Care, Other (non HMO) | Admitting: Hematology

## 2017-11-16 ENCOUNTER — Other Ambulatory Visit: Payer: Managed Care, Other (non HMO)

## 2017-11-21 ENCOUNTER — Other Ambulatory Visit: Payer: Self-pay | Admitting: Hematology

## 2017-11-21 DIAGNOSIS — C8308 Small cell B-cell lymphoma, lymph nodes of multiple sites: Secondary | ICD-10-CM

## 2017-11-23 ENCOUNTER — Ambulatory Visit: Payer: Managed Care, Other (non HMO) | Admitting: Hematology

## 2017-11-23 ENCOUNTER — Other Ambulatory Visit: Payer: Managed Care, Other (non HMO)

## 2017-11-24 ENCOUNTER — Telehealth: Payer: Self-pay | Admitting: *Deleted

## 2017-11-24 NOTE — Telephone Encounter (Signed)
Received VM from pt asking for RN call.  This RN attempted to call patient.  Call straight to VM.  LVM asking for pt call back.  Call back number provided.

## 2017-11-29 ENCOUNTER — Telehealth: Payer: Self-pay

## 2017-11-29 NOTE — Progress Notes (Signed)
HEMATOLOGY/ONCOLOGY CLINIC NOTE  Date of Service: 11/30/17  Patient Care Team: [name], MD as PCP - General (Family Medicine)  CHIEF COMPLAINTS/PURPOSE OF CONSULTATION:  F/u for mx of CLL/SLL  HISTORY OF PRESENTING ILLNESS:   Jordan Schroeder is a wonderful 37 y.o. 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 He is accompanied today by his mother. The pt reports that he is doing well overall.   The pt reports that he has not had any significant medical problems in the past. He notes that on an annual physical, his PCP noticed his WBC was high, and after repeat blood tests, he was referred to Dr. Nicholas Lose. Dr. Lindi Adie has already begun the initial work ups as noted below. The pt notes that he does not notice any change in his energy levels or feeling any different over the last 6 months.   He notes that he has some concerns for sleep apnea as he snores a lot at night but does not wake up due to SOB.   Of note prior to the patient's visit today, pt has had CT C/A/P completed on 10/11/17 with results revealing Lymphadenopathy throughout the chest, abdomen, and pelvis.   On 09/21/17 the pt also had a Flow Cytometry revealing MONOCLONAL B-CELL POPULATION IDENTIFIED.  Most recent lab results (09/21/17) of CBC  is as follows: all values are WNL except for WBC at 23.4k, Lymphs Abs at 15.9k, Monocytes Abs at 1.1k. LDH 09/21/17 was WNL at 214. FISH 09/21/12 is pending.  [facility] 10/05/17 revealed CEP 12 at 100%,  13q14.3 is at 47.67% for normal nuclei, and 06.23% with mono allelic deletion of [id number]. CL TP53 for 17p13 revealed 100% with normal nuclei.  ATM for 11q22.3 revealed 81.67% with normal nuclei and 18.33% with positive nuclei for del 11q22.3  On review of systems, pt reports some lower abdominal pain, good energy levels, and denies fevers, chills, night sweats, weight loss, diarrhea, blood in the stools,  black stools, problems passing urine, and any other symptoms.   On PMHx the pt reports HTN. . On Social Hx the pt denies smoking at any point, any ETOH consumption, and illicit drug use.  Interval History:  Jordan Schroeder returns today regarding his recently diagnosed CLL/SLL. The patient's last visit with [place] was on 10/30/17. He is accompanied today by his mother. The pt reports that he is doing well overall and is ready to begin his first cycle of Bendamustine Rituxan.   The pt reports that he has picked up all of his medications including Allopurinol, though he only has four left. We discussed that I will refill this today.   Of note since the patient's last visit, pt has had PET completed on 11/07/17 with results revealing 1. The previously demonstrated lymphadenopathy throughout the neck, chest, abdomen and pelvis is similar to recent diagnostic CTs and demonstrates no associated hypermetabolic activity. Overall nodal activity is similar to background blood pool activity. 2. No splenomegaly or hypermetabolic activity within the visceral organs. No suspicious marrow activity. 3. New largely linear opacities at both lung bases with mild hypermetabolic activity, probably atelectasis. In the appropriate clinical context, these could be inflammatory. 4. Paranasal sinus inflammatory changes bilaterally with air-fluid levels, likely active sinusitis.  Lab results today (11/30/17) of CBC, CMP, and Reticulocytes is as follows: all values are WNL except for WBC at 26.5k, Lymphs Abs at 20.7k, Eosinophils Abs at 1.1k,  and Glucose at 143. Uric aicid 11/30/17 is WNL at 4.8.  LDH 11/30/17 is . Lab Results  Component Value Date   LDH 204 11/30/2017     On review of systems, pt reports mild lower abdominal pains, moving his bowels well and denies leg swelling, problems passing urine, and any other symptoms.    MEDICAL HISTORY:  Past Medical History:  Diagnosis Date  . Hypertension     SURGICAL  HISTORY: History reviewed. No pertinent surgical history.  SOCIAL HISTORY: Social History   Socioeconomic History  . Marital status: Married    Spouse name: Not on file  . Number of children: Not on file  . Years of education: Not on file  . Highest education level: Not on file  Occupational History  . Not on file  Social Needs  . Financial resource strain: Not on file  . Food insecurity:    Worry: Not on file    Inability: Not on file  . Transportation needs:    Medical: Not on file    Non-medical: Not on file  Tobacco Use  . Smoking status: Never Smoker  . Smokeless tobacco: Never Used  Substance and Sexual Activity  . Alcohol use: Not on file  . Drug use: Not on file  . Sexual activity: Not on file  Lifestyle  . Physical activity:    Days per week: Not on file    Minutes per session: Not on file  . Stress: Not on file  Relationships  . Social connections:    Talks on phone: Not on file    Gets together: Not on file    Attends religious service: Not on file    Active member of club or organization: Not on file    Attends meetings of clubs or organizations: Not on file    Relationship status: Not on file  . Intimate partner violence:    Fear of current or ex partner: Not on file    Emotionally abused: Not on file    Physically abused: Not on file    Forced sexual activity: Not on file  Other Topics Concern  . Not on file  Social History Narrative  . Not on file    FAMILY HISTORY: History reviewed. No pertinent family history.  ALLERGIES:  has No Known Allergies.  MEDICATIONS:  Current Outpatient Medications  Medication Sig Dispense Refill  . acyclovir (ZOVIRAX) 400 MG tablet Take 1 tablet (400 mg total) by mouth daily. 30 tablet 3  . allopurinol (ZYLOPRIM) 300 MG tablet Take 1 tablet (300 mg total) by mouth daily. 30 tablet 0  . dexamethasone (DECADRON) 4 MG tablet Take 2 tablets (8 mg total) by mouth daily. Start the day after bendamustine chemotherapy  for 2 days. Take with food. 30 tablet 1  . lisinopril (PRINIVIL,ZESTRIL) 10 MG tablet Take 10 mg by mouth daily.    . ondansetron (ZOFRAN) 8 MG tablet Take 1 tablet (8 mg total) by mouth 2 (two) times daily as needed for refractory nausea / vomiting. Start on day 2 after bendamustine chemo. 30 tablet 1  . prochlorperazine (COMPAZINE) 10 MG tablet Take 1 tablet (10 mg total) by mouth every 6 (six) hours as needed (Nausea or vomiting). 30 tablet 1  . sertraline (ZOLOFT) 100 MG tablet Take 100 mg by mouth daily.     No current facility-administered medications for this visit.     REVIEW OF SYSTEMS:    A 10+ POINT REVIEW OF SYSTEMS WAS OBTAINED including  neurology, dermatology, psychiatry, cardiac, respiratory, lymph, extremities, GI, GU, Musculoskeletal, constitutional, breasts, reproductive, HEENT.  All pertinent positives are noted in the HPI.  All others are negative.    PHYSICAL EXAMINATION: ECOG PERFORMANCE STATUS: 1 - Symptomatic but completely ambulatory  . Vitals:   11/30/17 0906  BP: 110/79  Pulse: 96  Resp: 18  Temp: 98.6 F (37 C)  SpO2: 96%   Filed Weights   11/30/17 0906  Weight: 280 lb 1.6 oz (127.1 kg)   .Body mass index is 42.59 kg/m.  GENERAL:alert, in no acute distress and comfortable SKIN: no acute rashes, no significant lesions EYES: conjunctiva are pink and non-injected, sclera anicteric OROPHARYNX: MMM, no exudates, no oropharyngeal erythema or ulceration NECK: supple, no JVD LYMPH: Minimal palpable supraclavicular and axillary LNadenopathy no palpable lymphadenopathy in the or inguinal region LUNGS: clear to auscultation b/l with normal respiratory effort HEART: regular rate & rhythm ABDOMEN:  normoactive bowel sounds , non tender, not distended. Extremity: no pedal edema PSYCH: alert & oriented x 3 with fluent speech NEURO: no focal motor/sensory deficits    LABORATORY DATA:  I have reviewed the data as listed  . CBC Latest Ref Rng & Units  11/30/2017 10/30/2017 10/25/2017  WBC 4.0 - 10.3 K/uL 26.5(H) 17.9(H) 26.2(H)  Hemoglobin 13.0 - 17.1 g/dL 16.0 16.4 16.1  Hematocrit 38.4 - 49.9 % 47.8 49.1 48.0  Platelets 140 - 400 K/uL 182 217 243    . CMP Latest Ref Rng & Units 11/30/2017 10/30/2017 10/25/2017  Glucose 70 - 140 mg/dL 143(H) 103 97  BUN 7 - 26 mg/dL '11 12 16  ' Creatinine 0.70 - 1.30 mg/dL 0.77 1.18 0.74  Sodium 136 - 145 mmol/L 140 140 140  Potassium 3.5 - 5.1 mmol/L 4.0 4.3 4.3  Chloride 98 - 109 mmol/L 107 104 105  CO2 22 - 29 mmol/L '22 28 25  ' Calcium 8.4 - 10.4 mg/dL 9.3 10.0 9.2  Total Protein 6.4 - 8.3 g/dL 6.8 7.2 -  Total Bilirubin 0.2 - 1.2 mg/dL 0.5 0.6 -  Alkaline Phos 40 - 150 U/L 85 82 -  AST 5 - 34 U/L 21 23 -  ALT 0 - 55 U/L 22 23 -   Component     Latest Ref Rng & Units 10/30/2017  HIV Screen 4th Generation wRfx     Non Reactive Non Reactive  HCV Ab     0.0 - 0.9 s/co ratio <0.1  Hepatitis B Surface Ag     Negative Negative  Hep B Core Ab, Tot     Negative Negative      10/25/17 Tissue Flow Cytometry:    10/25/17 Needle/core Bx:   F[facility] Oncology  Order: [id number]  Status:  Final result Visible to patient:  No (Not Released) Next appt:  11/07/2017 at 09:00 AM in Radiology ([facility] PET) Dx:  Lymphocytosis  Component 52moago  Specimen Type Comment:   Comment: BLOOD  Cells Counted 200   Cells Analyzed 200   FISH Result Comment:   Comment: NORMAL: NO CYCLIN D1 OR IGH GENE REARRANGEMENT OBSERVED  Interpretation Comment:   Comment: (NOTE)        nuc ish 11q13(CCND1x2),14q32(IGHx2)[200]    The fluorescence in situ hybridization (FISH) result  was normal. Dual color dual fusion DNA FISH probes targeting  the cyclin D1 gene (CCND1 or BCL1)([employer] and the  heavy chain immunoglobulin gene (IgH) at 14q32 showed two  hybridization signals each with no fusion in all cells  examined. Thus, there  was NO apparent rearrangement of the  primary genes involved with mantle cell  lymphoma, and to a  lesser extent myeloma. Marland Kitchen         RADIOGRAPHIC STUDIES: I have personally reviewed the radiological images as listed and agreed with the findings in the report. Dg Chest 2 View  Result Date: 11/07/2017 CLINICAL DATA:  Two weeks of cough. The patient is about to began chemotherapy for lymphoma. EXAM: CHEST - 2 VIEW COMPARISON:  CT scan of the chest of October 11, 2017 FINDINGS: There are hazy increased lung markings in the left lower lobe and in the left perihilar region. There is no definite abnormality on the right. There is no pleural effusion. The cardiac silhouette is top-normal to mildly enlarged. The pulmonary vascularity is engorged. IMPRESSION: Right lower lobe and right perihilar infiltrate most compatible with pneumonia. Followup PA and lateral chest X-ray is recommended in 3-4 weeks following trial of antibiotic therapy to ensure resolution and exclude underlying malignancy. Electronically Signed   By: David  Martinique M.D.   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 EXAM: NUCLEAR MEDICINE PET SKULL BASE TO THIGH TECHNIQUE: 13.7 mCi F-18 FDG was injected intravenously. Full-ring PET imaging was performed from the skull base to thigh after the radiotracer. CT data was obtained and used for attenuation correction and anatomic localization. Fasting blood glucose: 113 mg/dl COMPARISON:  CTs the chest, abdomen and pelvis 10/11/2017. FINDINGS: Mediastinal blood pool activity: SUV max 2.2 NECK: There are numerous mildly enlarged cervical lymph nodes bilaterally which demonstrate no metabolic activity greater than blood pool. There is no suspicious metabolic activity within the pharyngeal mucosal space. Mucosal thickening is present throughout the paranasal sinuses with possible air-fluid levels in the sphenoid and left maxillary sinuses. There is  associated mild hypermetabolic activity. Incidental CT findings: None. CHEST: As on recent CT, there are numerous mildly enlarged mediastinal, hilar and axillary lymph nodes bilaterally. These demonstrate no metabolic activity above blood pool. There is hypermetabolic activity at both lung bases, corresponding with new lower lobe airspace opacities bilaterally. This has an SUV max of up to 7.0 on the right. The opacities appear largely linear and are favored to reflect atelectasis, although could be inflammatory. Incidental CT findings: None.  No significant pleural effusion. ABDOMEN/PELVIS: There is no hypermetabolic activity within the liver, adrenal glands, spleen or pancreas. The spleen is normal in size. There is no focal hypermetabolic activity associated with the multiple enlarged abdominopelvic lymph nodes. In particular, the bulky node within the portacaval space measuring 11.0 x 8.2 cm on image 119 demonstrates homogeneous activity within SUV max of 3.2, similar to liver (3.7) and spleen (3.0). Overall, no significant changes seen in the multiple enlarged lymph nodes, including the one that was biopsied in the left pelvis, measuring 8.4 x 3.1 cm on image 175/4. Incidental CT findings: Mild hepatic steatosis.  Vasectomy clips. SKELETON: There is no hypermetabolic activity to suggest osseous metastatic disease. Incidental CT findings: none IMPRESSION: 1. The previously demonstrated lymphadenopathy throughout the neck, chest, abdomen and pelvis is similar to recent diagnostic CTs and demonstrates no associated hypermetabolic activity. Overall nodal activity is similar to background blood pool activity. 2. No splenomegaly or hypermetabolic activity within the visceral organs. No suspicious marrow activity. 3. New largely linear opacities at both lung bases with mild hypermetabolic activity, probably atelectasis. In the appropriate clinical context, these could  be inflammatory. 4. Paranasal sinus inflammatory  changes bilaterally with air-fluid levels, likely active sinusitis. Electronically Signed   By: Richardean Sale M.D.   On: 11/07/2017 14:37    ASSESSMENT & PLAN:   37 y.o. male with  1. Newly diagnosed Non Hodgkins lymphoma/leukemia --  CLL/SLL we did r/o  Mantle Cell Lymphoma  -Discussed patient's most recent labs from 09/21/17; WBC at 23.4k, Lymphs Abs at 15.9k -Reviewed 09/21/17 Flow Cytometry results with pt which revealed a CD5+ monoclonal B-Cell population.  -Reviewed 10/11/17 CT of C/A/P which revealed lymphadenopathy throughout the chest, abdomen and pelvis. Most notably in the axilla and abdomen.   -Noted 13q deletion: 13q14.3 is at 47.67% for normal nuclei, and 96.72% with mono allelic deletion of [id number].  -ATM for 11q22.3 revealed 81.67% with normal nuclei and 18.33% with positive nuclei for del 11q22.3 ---> point more to CLL/SLL   PLAN - -Discussed pt labwork today, 11/30/17; WBC have increased from one month ago to 26.5k and Lymphs abs have increased from 11.6k to 20.7k. -reviewedPET/CT from 11/07/2017. -chemo-counseling completed -patient with proceed wtith C1 of  BR starting today -Allopurinol for C1 to limit tumor lysis, Acyclovir for shingles prevention, Dexamethasone, and nausea mx.   -Will refill Allopurinol -Pt is to proceed with C1 of BR today -Will see pt back on C1D10 and then first day of every cycle.     RTC with Dr Irene Limbo in 2 weeks with labs for a toxicity check Please schedule C2 of BR in 4 weeks per orders with labs RTC with Dr Irene Limbo in 4 weeks with labs with C2D1 of treatment    All of the patients questions were answered with apparent satisfaction. The patient knows to call the clinic with any problems, questions or concerns.  . The total time spent in the appointment was 25 minutes and more than 50% was on counseling and direct patient cares.    Sullivan Lone MD MS AAHIVMS [facility] Hematology/Oncology Physician [facility]  (Office):        [phone] (Work cell):  [phone] (Fax):           [phone]  11/30/2017 9:26 AM  I, Baldwin [place], am acting as a Education administrator for Dr Irene Limbo.   .I have reviewed the above documentation for accuracy and completeness, and I agree with the above. Brunetta Genera MD MS

## 2017-11-29 NOTE — Telephone Encounter (Signed)
Pt called requesting refill for allopurinol. Dr. Irene Limbo to see pt tomorrow. Wants to review labs and pt symptoms to determine need for refill. Pt verbalized understanding. Reviewed pt appointments for Thursday and Friday this week.

## 2017-11-30 ENCOUNTER — Inpatient Hospital Stay: Payer: Managed Care, Other (non HMO) | Attending: Hematology

## 2017-11-30 ENCOUNTER — Encounter: Payer: Self-pay | Admitting: Hematology

## 2017-11-30 ENCOUNTER — Inpatient Hospital Stay (HOSPITAL_BASED_OUTPATIENT_CLINIC_OR_DEPARTMENT_OTHER): Payer: Managed Care, Other (non HMO) | Admitting: Medical

## 2017-11-30 ENCOUNTER — Inpatient Hospital Stay: Payer: Managed Care, Other (non HMO)

## 2017-11-30 ENCOUNTER — Inpatient Hospital Stay: Payer: Managed Care, Other (non HMO) | Admitting: Hematology

## 2017-11-30 ENCOUNTER — Telehealth: Payer: Self-pay | Admitting: Hematology

## 2017-11-30 VITALS — BP 110/79 | HR 96 | Temp 98.6°F | Resp 18 | Ht 68.0 in | Wt 280.1 lb

## 2017-11-30 VITALS — BP 117/77 | HR 116 | Temp 99.4°F | Resp 18

## 2017-11-30 DIAGNOSIS — Z7189 Other specified counseling: Secondary | ICD-10-CM

## 2017-11-30 DIAGNOSIS — R109 Unspecified abdominal pain: Secondary | ICD-10-CM | POA: Insufficient documentation

## 2017-11-30 DIAGNOSIS — G4730 Sleep apnea, unspecified: Secondary | ICD-10-CM | POA: Insufficient documentation

## 2017-11-30 DIAGNOSIS — Z5112 Encounter for antineoplastic immunotherapy: Secondary | ICD-10-CM | POA: Insufficient documentation

## 2017-11-30 DIAGNOSIS — R918 Other nonspecific abnormal finding of lung field: Secondary | ICD-10-CM | POA: Diagnosis not present

## 2017-11-30 DIAGNOSIS — K760 Fatty (change of) liver, not elsewhere classified: Secondary | ICD-10-CM

## 2017-11-30 DIAGNOSIS — Z79899 Other long term (current) drug therapy: Secondary | ICD-10-CM | POA: Diagnosis not present

## 2017-11-30 DIAGNOSIS — C8308 Small cell B-cell lymphoma, lymph nodes of multiple sites: Secondary | ICD-10-CM | POA: Diagnosis not present

## 2017-11-30 DIAGNOSIS — R198 Other specified symptoms and signs involving the digestive system and abdomen: Secondary | ICD-10-CM

## 2017-11-30 DIAGNOSIS — R599 Enlarged lymph nodes, unspecified: Secondary | ICD-10-CM

## 2017-11-30 DIAGNOSIS — I10 Essential (primary) hypertension: Secondary | ICD-10-CM | POA: Diagnosis not present

## 2017-11-30 DIAGNOSIS — T8090XA Unspecified complication following infusion and therapeutic injection, initial encounter: Secondary | ICD-10-CM

## 2017-11-30 LAB — CMP (CANCER CENTER ONLY)
ALT: 22 U/L (ref 0–55)
ANION GAP: 11 (ref 3–11)
AST: 21 U/L (ref 5–34)
Albumin: 4.2 g/dL (ref 3.5–5.0)
Alkaline Phosphatase: 85 U/L (ref 40–150)
BUN: 11 mg/dL (ref 7–26)
CHLORIDE: 107 mmol/L (ref 98–109)
CO2: 22 mmol/L (ref 22–29)
Calcium: 9.3 mg/dL (ref 8.4–10.4)
Creatinine: 0.77 mg/dL (ref 0.70–1.30)
GFR, Est AFR Am: >60 mL/min (ref >60)
Glucose, Bld: 143 mg/dL — ABNORMAL HIGH (ref 70–140)
POTASSIUM: 4 mmol/L (ref 3.5–5.1)
Sodium: 140 mmol/L (ref 136–145)
Total Bilirubin: 0.5 mg/dL (ref 0.2–1.2)
Total Protein: 6.8 g/dL (ref 6.4–8.3)

## 2017-11-30 LAB — CBC WITH DIFFERENTIAL (CANCER CENTER ONLY)
BASOS ABS: 0.1 10*3/uL (ref 0.0–0.1)
Basophils Relative: 0 %
Eosinophils Absolute: 1.1 10*3/uL — ABNORMAL HIGH (ref 0.0–0.5)
Eosinophils Relative: 4 %
HEMATOCRIT: 47.8 % (ref 38.4–49.9)
HEMOGLOBIN: 16 g/dL (ref 13.0–17.1)
Lymphocytes Relative: 78 %
Lymphs Abs: 20.7 10*3/uL — ABNORMAL HIGH (ref 0.9–3.3)
MCH: 30.5 pg (ref 27.2–33.4)
MCHC: 33.5 g/dL (ref 32.0–36.0)
MCV: 91 fL (ref 79.3–98.0)
MONO ABS: 0.7 10*3/uL (ref 0.1–0.9)
Monocytes Relative: 3 %
NEUTROS ABS: 3.9 10*3/uL (ref 1.5–6.5)
NEUTROS PCT: 15 %
Platelet Count: 182 10*3/uL (ref 140–400)
RBC: 5.25 MIL/uL (ref 4.20–5.82)
RDW: 13.5 % (ref 11.0–14.6)
WBC: 26.5 10*3/uL — AB (ref 4.0–10.3)

## 2017-11-30 LAB — RETICULOCYTES
RBC.: 5.25 MIL/uL (ref 4.20–5.82)
RETIC COUNT ABSOLUTE: 57.8 10*3/uL (ref 34.8–93.9)
Retic Ct Pct: 1.1 % (ref 0.8–1.8)

## 2017-11-30 LAB — URIC ACID: URIC ACID, SERUM: 4.8 mg/dL (ref 2.6–7.4)

## 2017-11-30 LAB — LACTATE DEHYDROGENASE: LDH: 204 U/L (ref 125–245)

## 2017-11-30 MED ORDER — METHYLPREDNISOLONE SODIUM SUCC 125 MG IJ SOLR
125.0000 mg | Freq: Once | INTRAMUSCULAR | Status: AC | PRN
  Administered 2017-11-30: 125 mg via INTRAVENOUS

## 2017-11-30 MED ORDER — PALONOSETRON HCL INJECTION 0.25 MG/5ML
0.2500 mg | Freq: Once | INTRAVENOUS | Status: AC
  Administered 2017-11-30: 0.25 mg via INTRAVENOUS

## 2017-11-30 MED ORDER — DEXAMETHASONE SODIUM PHOSPHATE 10 MG/ML IJ SOLN
INTRAMUSCULAR | Status: AC
  Filled 2017-11-30: qty 1

## 2017-11-30 MED ORDER — FAMOTIDINE IN NACL 20-0.9 MG/50ML-% IV SOLN
20.0000 mg | Freq: Once | INTRAVENOUS | Status: AC | PRN
  Administered 2017-11-30: 20 mg via INTRAVENOUS

## 2017-11-30 MED ORDER — DEXAMETHASONE SODIUM PHOSPHATE 10 MG/ML IJ SOLN
10.0000 mg | Freq: Once | INTRAMUSCULAR | Status: AC
  Administered 2017-11-30: 10 mg via INTRAVENOUS

## 2017-11-30 MED ORDER — SODIUM CHLORIDE 0.9 % IV SOLN
90.0000 mg/m2 | Freq: Once | INTRAVENOUS | Status: AC
  Administered 2017-11-30: 225 mg via INTRAVENOUS
  Filled 2017-11-30: qty 9

## 2017-11-30 MED ORDER — ALLOPURINOL 300 MG PO TABS: 300.0000 mg | ORAL_TABLET | Freq: Every day | ORAL | 0 refills | Status: DC

## 2017-11-30 MED ORDER — ACETAMINOPHEN 325 MG PO TABS
ORAL_TABLET | ORAL | Status: AC
  Filled 2017-11-30: qty 2

## 2017-11-30 MED ORDER — ACETAMINOPHEN 325 MG PO TABS
650.0000 mg | ORAL_TABLET | Freq: Once | ORAL | Status: AC
  Administered 2017-11-30: 650 mg via ORAL

## 2017-11-30 MED ORDER — SODIUM CHLORIDE 0.9 % IV SOLN
Freq: Once | INTRAVENOUS | Status: AC
  Administered 2017-11-30: 11:00:00 via INTRAVENOUS

## 2017-11-30 MED ORDER — SODIUM CHLORIDE 0.9 % IV SOLN
375.0000 mg/m2 | Freq: Once | INTRAVENOUS | Status: AC
  Administered 2017-11-30: 900 mg via INTRAVENOUS
  Filled 2017-11-30: qty 50

## 2017-11-30 MED ORDER — PALONOSETRON HCL INJECTION 0.25 MG/5ML
INTRAVENOUS | Status: AC
  Filled 2017-11-30: qty 5

## 2017-11-30 MED ORDER — DIPHENHYDRAMINE HCL 25 MG PO CAPS
ORAL_CAPSULE | ORAL | Status: AC
  Filled 2017-11-30: qty 2

## 2017-11-30 MED ORDER — DIPHENHYDRAMINE HCL 25 MG PO CAPS
50.0000 mg | ORAL_CAPSULE | Freq: Once | ORAL | Status: AC
  Administered 2017-11-30: 50 mg via ORAL

## 2017-11-30 NOTE — Telephone Encounter (Signed)
Appointments scheduled AVS/Calendar printed per 5/30 los °

## 2017-11-30 NOTE — Progress Notes (Signed)
1330- Rituxan running at 150 mg/hr (56 mL/Hr) when pt complaint of feeling a slight warm sensation.  Rituxan paused NS wide open and Sandi Mealy, [place] notified.  1335- Pt extremely red and diaphoretic. Oxygen 2 L [place] applied to pt and Verbal orders received from [employer], [place] to administer 125 mg IV solumedrol.    1340- pt symptoms completely resolved, VSS and documented in epic  1347- Verbal order received from [employer], [place] to administer 20 mg IV Pepcid  1403- rituxan infusion restarted at previous rate prior to reaction of 100 mg/Hr (38 mL/Hr).  Pt tolerated remainder of infusion increases with no issues.

## 2017-11-30 NOTE — Patient Instructions (Signed)
[facility] Discharge Instructions for Patients Receiving Chemotherapy  Today you received the following chemotherapy agents rituxan and bendeka  To help prevent nausea and vomiting after your treatment, we encourage you to take your nausea medication as directed   If you develop nausea and vomiting that is not controlled by your nausea medication, call the clinic.   BELOW ARE SYMPTOMS THAT SHOULD BE REPORTED IMMEDIATELY:  *FEVER GREATER THAN 100.5 F  *CHILLS WITH OR WITHOUT FEVER  NAUSEA AND VOMITING THAT IS NOT CONTROLLED WITH YOUR NAUSEA MEDICATION  *UNUSUAL SHORTNESS OF BREATH  *UNUSUAL BRUISING OR BLEEDING  TENDERNESS IN MOUTH AND THROAT WITH OR WITHOUT PRESENCE OF ULCERS  *URINARY PROBLEMS  *BOWEL PROBLEMS  UNUSUAL RASH Items with * indicate a potential emergency and should be followed up as soon as possible.  Feel free to call the clinic should you have any questions or concerns. The clinic phone number is [phone].  Please show the [facility] at check-in to the Emergency Department and triage nurse.   Rituximab (Rituxan) injection What is this medicine? RITUXIMAB (ri TUX i mab) is a monoclonal antibody. It is used to treat certain types of cancer like non-Hodgkin lymphoma and chronic lymphocytic leukemia. It is also used to treat rheumatoid arthritis, granulomatosis with polyangiitis (or Wegener's granulomatosis), and microscopic polyangiitis. This medicine may be used for other purposes; ask your health care provider or pharmacist if you have questions. COMMON BRAND NAME(S): Rituxan What should I tell my health care provider before I take this medicine? They need to know if you have any of these conditions: -heart disease -infection (especially a virus infection such as hepatitis B, chickenpox, cold sores, or herpes) -immune system problems -irregular heartbeat -kidney disease -lung or breathing disease, like asthma -recently received  or scheduled to receive a vaccine -an unusual or allergic reaction to rituximab, mouse proteins, other medicines, foods, dyes, or preservatives -pregnant or trying to get pregnant -breast-feeding How should I use this medicine? This medicine is for infusion into a vein. It is administered in a hospital or clinic by a specially trained health care professional. A special MedGuide will be given to you by the pharmacist with each prescription and refill. Be sure to read this information carefully each time. Talk to your pediatrician regarding the use of this medicine in children. This medicine is not approved for use in children. Overdosage: If you think you have taken too much of this medicine contact a poison control center or emergency room at once. NOTE: This medicine is only for you. Do not share this medicine with others. What if I miss a dose? It is important not to miss a dose. Call your doctor or health care professional if you are unable to keep an appointment. What may interact with this medicine? -cisplatin -other medicines for arthritis like disease modifying antirheumatic drugs or tumor necrosis factor inhibitors -live virus vaccines This list may not describe all possible interactions. Give your health care provider a list of all the medicines, herbs, non-prescription drugs, or dietary supplements you use. Also tell them if you smoke, drink alcohol, or use illegal drugs. Some items may interact with your medicine. What should I watch for while using this medicine? Your condition will be monitored carefully while you are receiving this medicine. You may need blood work done while you are taking this medicine. This medicine can cause serious allergic reactions. To reduce your risk you may need to take medicine before treatment with this  medicine. Take your medicine as directed. In some patients, this medicine may cause a serious brain infection that may cause death. If you have any  problems seeing, thinking, speaking, walking, or standing, tell your doctor right away. If you cannot reach your doctor, urgently seek other source of medical care. Call your doctor or health care professional for advice if you get a fever, chills or sore throat, or other symptoms of a cold or flu. Do not treat yourself. This drug decreases your body's ability to fight infections. Try to avoid being around people who are sick. Do not become pregnant while taking this medicine or for 12 months after stopping it. Women should inform their doctor if they wish to become pregnant or think they might be pregnant. There is a potential for serious side effects to an unborn child. Talk to your health care professional or pharmacist for more information. What side effects may I notice from receiving this medicine? Side effects that you should report to your doctor or health care professional as soon as possible: -breathing problems -chest pain -dizziness or feeling faint -fast, irregular heartbeat -low blood counts - this medicine may decrease the number of white blood cells, red blood cells and platelets. You may be at increased risk for infections and bleeding. -mouth sores -redness, blistering, peeling or loosening of the skin, including inside the mouth (this can be added for any serious or exfoliative rash that could lead to hospitalization) -signs of infection - fever or chills, cough, sore throat, pain or difficulty passing urine -signs and symptoms of kidney injury like trouble passing urine or change in the amount of urine -signs and symptoms of liver injury like dark yellow or brown urine; general ill feeling or flu-like symptoms; light-colored stools; loss of appetite; nausea; right upper belly pain; unusually weak or tired; yellowing of the eyes or skin -stomach pain -vomiting Side effects that usually do not require medical attention (report to your doctor or health care professional if they  continue or are bothersome): -headache -joint pain -muscle cramps or muscle pain This list may not describe all possible side effects. Call your doctor for medical advice about side effects. You may report side effects to FDA at 1-800-FDA-1088. Where should I keep my medicine? This drug is given in a hospital or clinic and will not be stored at home. NOTE: This sheet is a summary. It may not cover all possible information. If you have questions about this medicine, talk to your doctor, pharmacist, or health care provider.  2018 Elsevier/Gold Standard (2016-01-27 15:28:09)    Bendamustine (Bendeka)Injection What is this medicine? BENDAMUSTINE (BEN da MUS teen) is a chemotherapy drug. It is used to treat chronic lymphocytic leukemia and non-Hodgkin lymphoma. This medicine may be used for other purposes; ask your health care provider or pharmacist if you have questions. COMMON BRAND NAME(S): BENDEKA, Treanda What should I tell my health care provider before I take this medicine? They need to know if you have any of these conditions: -infection (especially a virus infection such as chickenpox, cold sores, or herpes) -kidney disease -liver disease -an unusual or allergic reaction to bendamustine, mannitol, other medicines, foods, dyes, or preservatives -pregnant or trying to get pregnant -breast-feeding How should I use this medicine? This medicine is for infusion into a vein. It is given by a health care professional in a hospital or clinic setting. Talk to your pediatrician regarding the use of this medicine in children. Special care may be needed. Overdosage: If  you think you have taken too much of this medicine contact a poison control center or emergency room at once. NOTE: This medicine is only for you. Do not share this medicine with others. What if I miss a dose? It is important not to miss your dose. Call your doctor or health care professional if you are unable to keep an  appointment. What may interact with this medicine? Do not take this medicine with any of the following medications: -clozapine This medicine may also interact with the following medications: -atazanavir -cimetidine -ciprofloxacin -enoxacin -fluvoxamine -medicines for seizures like carbamazepine and phenobarbital -mexiletine -rifampin -tacrine -thiabendazole -zileuton This list may not describe all possible interactions. Give your health care provider a list of all the medicines, herbs, non-prescription drugs, or dietary supplements you use. Also tell them if you smoke, drink alcohol, or use illegal drugs. Some items may interact with your medicine. What should I watch for while using this medicine? This drug may make you feel generally unwell. This is not uncommon, as chemotherapy can affect healthy cells as well as cancer cells. Report any side effects. Continue your course of treatment even though you feel ill unless your doctor tells you to stop. You may need blood work done while you are taking this medicine. Call your doctor or health care professional for advice if you get a fever, chills or sore throat, or other symptoms of a cold or flu. Do not treat yourself. This drug decreases your body's ability to fight infections. Try to avoid being around people who are sick. This medicine may increase your risk to bruise or bleed. Call your doctor or health care professional if you notice any unusual bleeding. Talk to your doctor about your risk of cancer. You may be more at risk for certain types of cancers if you take this medicine. Do not become pregnant while taking this medicine or for 3 months after stopping it. Women should inform their doctor if they wish to become pregnant or think they might be pregnant. Men should not father a child while taking this medicine and for 3 months after stopping it.There is a potential for serious side effects to an unborn child. Talk to your health care  professional or pharmacist for more information. Do not breast-feed an infant while taking this medicine. This medicine may interfere with the ability to have a child. You should talk with your doctor or health care professional if you are concerned about your fertility. What side effects may I notice from receiving this medicine? Side effects that you should report to your doctor or health care professional as soon as possible: -allergic reactions like skin rash, itching or hives, swelling of the face, lips, or tongue -low blood counts - this medicine may decrease the number of white blood cells, red blood cells and platelets. You may be at increased risk for infections and bleeding. -redness, blistering, peeling or loosening of the skin, including inside the mouth -signs of infection - fever or chills, cough, sore throat, pain or difficulty passing urine -signs of decreased platelets or bleeding - bruising, pinpoint red spots on the skin, black, tarry stools, blood in the urine -signs of decreased red blood cells - unusually weak or tired, fainting spells, lightheadedness -signs and symptoms of kidney injury like trouble passing urine or change in the amount of urine -signs and symptoms of liver injury like dark yellow or brown urine; general ill feeling or flu-like symptoms; light-colored stools; loss of appetite; nausea; right upper  belly pain; unusually weak or tired; yellowing of the eyes or skin Side effects that usually do not require medical attention (report to your doctor or health care professional if they continue or are bothersome): -constipation -decreased appetite -diarrhea -headache -mouth sores -nausea/vomiting -tiredness This list may not describe all possible side effects. Call your doctor for medical advice about side effects. You may report side effects to FDA at 1-800-FDA-1088. Where should I keep my medicine? This drug is given in a hospital or clinic and will not be  stored at home. NOTE: This sheet is a summary. It may not cover all possible information. If you have questions about this medicine, talk to your doctor, pharmacist, or health care provider.  2018 Elsevier/Gold Standard (2015-04-23 08:45:41)

## 2017-12-01 ENCOUNTER — Telehealth: Payer: Self-pay

## 2017-12-01 ENCOUNTER — Inpatient Hospital Stay: Payer: Managed Care, Other (non HMO)

## 2017-12-01 VITALS — BP 112/72 | HR 114 | Temp 98.4°F | Resp 18

## 2017-12-01 DIAGNOSIS — C8308 Small cell B-cell lymphoma, lymph nodes of multiple sites: Secondary | ICD-10-CM | POA: Diagnosis not present

## 2017-12-01 DIAGNOSIS — Z7189 Other specified counseling: Secondary | ICD-10-CM

## 2017-12-01 MED ORDER — DEXAMETHASONE SODIUM PHOSPHATE 10 MG/ML IJ SOLN
10.0000 mg | Freq: Once | INTRAMUSCULAR | Status: AC
  Administered 2017-12-01: 10 mg via INTRAVENOUS

## 2017-12-01 MED ORDER — SODIUM CHLORIDE 0.9 % IV SOLN
Freq: Once | INTRAVENOUS | Status: AC
  Administered 2017-12-01: 10:00:00 via INTRAVENOUS

## 2017-12-01 MED ORDER — DEXAMETHASONE SODIUM PHOSPHATE 10 MG/ML IJ SOLN
INTRAMUSCULAR | Status: AC
  Filled 2017-12-01: qty 1

## 2017-12-01 MED ORDER — BENDAMUSTINE HCL CHEMO INJECTION 100 MG/4ML
90.0000 mg/m2 | Freq: Once | INTRAVENOUS | Status: AC
  Administered 2017-12-01: 225 mg via INTRAVENOUS
  Filled 2017-12-01: qty 9

## 2017-12-01 NOTE — Progress Notes (Signed)
   DATE: 11/30/2017      X CHEMO/IMMUNOTHERAPY/IMMUNOTHERAPY REACTION            MD: Irene Limbo     AGENT/BLOOD PRODUCT RECEIVING TODAY:   rituximab and bendamustine  AGENT/BLOOD PRODUCT RECEIVING IMMEDIATELY PRIOR TO REACTION: rituximab  VS: BP:     108/68 P:       93 SPO2:       97% on room air     REACTION(S): Diaphoresis, nausea, and flushing  PREMEDS: APAP 650 mg PO x 1, Benadryl 50 mg PO   INTERVENTION:  Pepcid (20 mg)  x  1, Solumedrol 125 mg  x  1       Review of Systems  Constitutional: Positive for diaphoresis. Negative for chills.  HENT: Negative for congestion, facial swelling and trouble swallowing.   Respiratory: Negative for cough, choking, chest tightness, shortness of breath and wheezing.   Cardiovascular: Negative for chest pain and palpitations.  Gastrointestinal: Positive for nausea. Negative for vomiting.  Musculoskeletal: Negative for back pain.  Skin: Positive for color change. Negative for rash.       flushing  Neurological: Negative for dizziness, speech difficulty and headaches.  Psychiatric/Behavioral: The patient is not nervous/anxious.     Physical Exam  Constitutional: No distress.  HENT:  Head: Normocephalic and atraumatic.  Cardiovascular: Normal rate, regular rhythm and normal heart sounds. Exam reveals no gallop and no friction rub.  No murmur heard. Pulmonary/Chest: Effort normal and breath sounds normal. No respiratory distress. He has no wheezes. He has no rales.  Neurological: He is alert.  Skin: Skin is warm. No rash noted. He is diaphoretic. There is erythema.     OUTCOME: Patient responded to intervention. Chemo/Immunotherapy restarted and completed.  Sandi Mealy, MHS, PA-C

## 2017-12-01 NOTE — Patient Instructions (Signed)
[facility] Discharge Instructions for Patients Receiving Chemotherapy  Today you received the following chemotherapy agent: Bendeka (bendamustine).  To help prevent nausea and vomiting after your treatment, we encourage you to take your nausea medication as prescribed.   If you develop nausea and vomiting that is not controlled by your nausea medication, call the clinic.   BELOW ARE SYMPTOMS THAT SHOULD BE REPORTED IMMEDIATELY:  *FEVER GREATER THAN 100.5 F  *CHILLS WITH OR WITHOUT FEVER  NAUSEA AND VOMITING THAT IS NOT CONTROLLED WITH YOUR NAUSEA MEDICATION  *UNUSUAL SHORTNESS OF BREATH  *UNUSUAL BRUISING OR BLEEDING  TENDERNESS IN MOUTH AND THROAT WITH OR WITHOUT PRESENCE OF ULCERS  *URINARY PROBLEMS  *BOWEL PROBLEMS  UNUSUAL RASH Items with * indicate a potential emergency and should be followed up as soon as possible.  Feel free to call the clinic should you have any questions or concerns. The clinic phone number is [phone].  Please show the [facility] at check-in to the Emergency Department and triage nurse.

## 2017-12-01 NOTE — Telephone Encounter (Signed)
Called pt for first-time rtiuxan f/u call. LVM for pt to call back.

## 2017-12-06 ENCOUNTER — Telehealth: Payer: Self-pay

## 2017-12-06 ENCOUNTER — Other Ambulatory Visit: Payer: Self-pay

## 2017-12-06 DIAGNOSIS — C8308 Small cell B-cell lymphoma, lymph nodes of multiple sites: Secondary | ICD-10-CM

## 2017-12-06 NOTE — Telephone Encounter (Signed)
Performed chemo f/u phone call. Pt noted some nausea in the past week r/t treatment. Nausea medication not covering well, but pt is feeling better. F/u with Dr. Irene Limbo tomorrow confirmed.

## 2017-12-07 ENCOUNTER — Other Ambulatory Visit: Payer: Self-pay

## 2017-12-07 ENCOUNTER — Telehealth: Payer: Self-pay | Admitting: Hematology

## 2017-12-07 ENCOUNTER — Inpatient Hospital Stay: Payer: Managed Care, Other (non HMO) | Admitting: Hematology

## 2017-12-07 ENCOUNTER — Encounter: Payer: Self-pay | Admitting: Hematology

## 2017-12-07 ENCOUNTER — Inpatient Hospital Stay: Payer: Managed Care, Other (non HMO) | Attending: Hematology

## 2017-12-07 VITALS — BP 115/89 | HR 115 | Temp 99.0°F | Resp 18 | Ht 68.0 in | Wt 273.2 lb

## 2017-12-07 DIAGNOSIS — Z5111 Encounter for antineoplastic chemotherapy: Secondary | ICD-10-CM | POA: Diagnosis not present

## 2017-12-07 DIAGNOSIS — Z79899 Other long term (current) drug therapy: Secondary | ICD-10-CM | POA: Diagnosis not present

## 2017-12-07 DIAGNOSIS — Z5112 Encounter for antineoplastic immunotherapy: Secondary | ICD-10-CM | POA: Diagnosis not present

## 2017-12-07 DIAGNOSIS — C8308 Small cell B-cell lymphoma, lymph nodes of multiple sites: Secondary | ICD-10-CM

## 2017-12-07 DIAGNOSIS — I10 Essential (primary) hypertension: Secondary | ICD-10-CM | POA: Diagnosis not present

## 2017-12-07 DIAGNOSIS — M109 Gout, unspecified: Secondary | ICD-10-CM | POA: Diagnosis not present

## 2017-12-07 LAB — CBC WITH DIFFERENTIAL/PLATELET
BASOS ABS: 0.1 10*3/uL (ref 0.0–0.1)
BASOS PCT: 1 %
Eosinophils Absolute: 0.7 10*3/uL — ABNORMAL HIGH (ref 0.0–0.5)
Eosinophils Relative: 6 %
HEMATOCRIT: 48.6 % (ref 38.4–49.9)
HEMOGLOBIN: 16.6 g/dL (ref 13.0–17.1)
Lymphocytes Relative: 32 %
Lymphs Abs: 3.5 10*3/uL — ABNORMAL HIGH (ref 0.9–3.3)
MCH: 30.6 pg (ref 27.2–33.4)
MCHC: 34.2 g/dL (ref 32.0–36.0)
MCV: 89.7 fL (ref 79.3–98.0)
Monocytes Absolute: 1.6 10*3/uL — ABNORMAL HIGH (ref 0.1–0.9)
Monocytes Relative: 15 %
NEUTROS PCT: 46 %
Neutro Abs: 5.1 10*3/uL (ref 1.5–6.5)
Platelets: 229 10*3/uL (ref 140–400)
RBC: 5.42 MIL/uL (ref 4.20–5.82)
RDW: 13.4 % (ref 11.0–14.6)
WBC: 10.9 10*3/uL — AB (ref 4.0–10.3)

## 2017-12-07 LAB — RETICULOCYTES
RBC.: 5.42 MIL/uL (ref 4.20–5.82)
RETIC COUNT ABSOLUTE: 65 10*3/uL (ref 34.8–93.9)
Retic Ct Pct: 1.2 % (ref 0.8–1.8)

## 2017-12-07 LAB — CMP (CANCER CENTER ONLY)
ALBUMIN: 4.3 g/dL (ref 3.5–5.0)
ALT: 21 U/L (ref 0–55)
AST: 20 U/L (ref 5–34)
Alkaline Phosphatase: 90 U/L (ref 40–150)
Anion gap: 9 (ref 3–11)
BUN: 16 mg/dL (ref 7–26)
CHLORIDE: 102 mmol/L (ref 98–109)
CO2: 24 mmol/L (ref 22–29)
Calcium: 10 mg/dL (ref 8.4–10.4)
Creatinine: 0.79 mg/dL (ref 0.70–1.30)
GFR, Est AFR Am: >60 mL/min (ref >60)
GFR, Estimated: >60 mL/min (ref >60)
Glucose, Bld: 108 mg/dL (ref 70–140)
POTASSIUM: 4.2 mmol/L (ref 3.5–5.1)
SODIUM: 135 mmol/L — AB (ref 136–145)
Total Bilirubin: 0.6 mg/dL (ref 0.2–1.2)
Total Protein: 7.1 g/dL (ref 6.4–8.3)

## 2017-12-07 LAB — PHOSPHORUS: Phosphorus: 5.7 mg/dL — ABNORMAL HIGH (ref 2.5–4.6)

## 2017-12-07 LAB — URIC ACID: Uric Acid, Serum: 6 mg/dL (ref 2.6–7.4)

## 2017-12-07 LAB — LACTATE DEHYDROGENASE: LDH: 215 U/L (ref 125–245)

## 2017-12-07 NOTE — Telephone Encounter (Signed)
Appointments scheduled Calendar printed only patient declined AVS per 6/6 los

## 2017-12-07 NOTE — Progress Notes (Signed)
HEMATOLOGY/ONCOLOGY CLINIC NOTE  Date of Service: 12/07/17  Patient Care Team: [name], MD as PCP - General (Family Medicine)  CHIEF COMPLAINTS/PURPOSE OF CONSULTATION:  F/u for mx of CLL/SLL  HISTORY OF PRESENTING ILLNESS:   Jordan Schroeder is a wonderful 37 y.o. 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 He is accompanied today by his mother. The pt reports that he is doing well overall.   The pt reports that he has not had any significant medical problems in the past. He notes that on an annual physical, his PCP noticed his WBC was high, and after repeat blood tests, he was referred to Dr. Nicholas Lose. Dr. Lindi Adie has already begun the initial work ups as noted below. The pt notes that he does not notice any change in his energy levels or feeling any different over the last 6 months.   He notes that he has some concerns for sleep apnea as he snores a lot at night but does not wake up due to SOB.   Of note prior to the patient's visit today, pt has had CT C/A/P completed on 10/11/17 with results revealing Lymphadenopathy throughout the chest, abdomen, and pelvis.   On 09/21/17 the pt also had a Flow Cytometry revealing MONOCLONAL B-CELL POPULATION IDENTIFIED.  Most recent lab results (09/21/17) of CBC  is as follows: all values are WNL except for WBC at 23.4k, Lymphs Abs at 15.9k, Monocytes Abs at 1.1k. LDH 09/21/17 was WNL at 214. FISH 09/21/12 is pending.  [facility] 10/05/17 revealed CEP 12 at 100%,  13q14.3 is at 47.67% for normal nuclei, and 09.62% with mono allelic deletion of [id number]. CL TP53 for 17p13 revealed 100% with normal nuclei.  ATM for 11q22.3 revealed 81.67% with normal nuclei and 18.33% with positive nuclei for del 11q22.3  On review of systems, pt reports some lower abdominal pain, good energy levels, and denies fevers, chills, night sweats, weight loss, diarrhea, blood in the stools,  black stools, problems passing urine, and any other symptoms.   On PMHx the pt reports HTN. . On Social Hx the pt denies smoking at any point, any ETOH consumption, and illicit drug use.  Interval History:  Jordan Schroeder returns today regarding his recently diagnosed CLL/SLL. The patient's last visit with [place] was on 11/30/17. He is accompanied today by his wife and father-in-law. The pt reports that he is doing well overall.   The pt reports that he had some nausea when he went home after his infusion. He notes that he took a nausea med and his mild nausea subsided. He has continued on his allopurinol and has been well hydrated and has eaten well. He notes that he has tolerated the treatment very well so far.   Lab results today (12/07/17) of CBC, CMP, and Reticulocytes is as follows: all values are WNL except for WBC at 10.9k, Lymphs Abs at 3.5k, Monocytes abs at 1.6k, Eosinophils abs at 0.7k, Sodium at 135. LDH 12/07/17 is WNL at 215 Uric acid 12/07/17 is WNL at 6.0 Phosphorous 12/07/17 is elevated at 5.7  On review of systems, pt reports subsided mild nausea, and denies diarrhea, constipation, skin rashes, shortness of breath, and any other symptoms.   MEDICAL HISTORY:  Past Medical History:  Diagnosis Date  . Hypertension     SURGICAL HISTORY: History reviewed. No pertinent surgical history.  SOCIAL HISTORY: Social History   Socioeconomic History  .  Marital status: Married    Spouse name: Not on file  . Number of children: Not on file  . Years of education: Not on file  . Highest education level: Not on file  Occupational History  . Not on file  Social Needs  . Financial resource strain: Not on file  . Food insecurity:    Worry: Not on file    Inability: Not on file  . Transportation needs:    Medical: Not on file    Non-medical: Not on file  Tobacco Use  . Smoking status: Never Smoker  . Smokeless tobacco: Never Used  Substance and Sexual Activity  . Alcohol use: Not on  file  . Drug use: Not on file  . Sexual activity: Not on file  Lifestyle  . Physical activity:    Days per week: Not on file    Minutes per session: Not on file  . Stress: Not on file  Relationships  . Social connections:    Talks on phone: Not on file    Gets together: Not on file    Attends religious service: Not on file    Active member of club or organization: Not on file    Attends meetings of clubs or organizations: Not on file    Relationship status: Not on file  . Intimate partner violence:    Fear of current or ex partner: Not on file    Emotionally abused: Not on file    Physically abused: Not on file    Forced sexual activity: Not on file  Other Topics Concern  . Not on file  Social History Narrative  . Not on file    FAMILY HISTORY: History reviewed. No pertinent family history.  ALLERGIES:  has No Known Allergies.  MEDICATIONS:  Current Outpatient Medications  Medication Sig Dispense Refill  . acyclovir (ZOVIRAX) 400 MG tablet Take 1 tablet (400 mg total) by mouth daily. 30 tablet 3  . allopurinol (ZYLOPRIM) 300 MG tablet Take 1 tablet (300 mg total) by mouth daily. 30 tablet 0  . dexamethasone (DECADRON) 4 MG tablet Take 2 tablets (8 mg total) by mouth daily. Start the day after bendamustine chemotherapy for 2 days. Take with food. 30 tablet 1  . lisinopril (PRINIVIL,ZESTRIL) 10 MG tablet Take 10 mg by mouth daily.    . ondansetron (ZOFRAN) 8 MG tablet Take 1 tablet (8 mg total) by mouth 2 (two) times daily as needed for refractory nausea / vomiting. Start on day 2 after bendamustine chemo. 30 tablet 1  . prochlorperazine (COMPAZINE) 10 MG tablet Take 1 tablet (10 mg total) by mouth every 6 (six) hours as needed (Nausea or vomiting). 30 tablet 1  . sertraline (ZOLOFT) 100 MG tablet Take 100 mg by mouth daily.     No current facility-administered medications for this visit.     REVIEW OF SYSTEMS:    A 10+ POINT REVIEW OF SYSTEMS WAS OBTAINED including  neurology, dermatology, psychiatry, cardiac, respiratory, lymph, extremities, GI, GU, Musculoskeletal, constitutional, breasts, reproductive, HEENT.  All pertinent positives are noted in the HPI.  All others are negative.   PHYSICAL EXAMINATION: ECOG PERFORMANCE STATUS: 1 - Symptomatic but completely ambulatory  Vitals:   12/07/17 1426  BP: 115/89  Pulse: (!) 115  Resp: 18  Temp: 99 F (37.2 C)  SpO2: 96%   Filed Weights   12/07/17 1426  Weight: 273 lb 3.2 oz (123.9 kg)   .Body mass index is 41.54 kg/m.  GENERAL:alert, in no  acute distress and comfortable SKIN: no acute rashes, no significant lesions EYES: conjunctiva are pink and non-injected, sclera anicteric OROPHARYNX: MMM, no exudates, no oropharyngeal erythema or ulceration NECK: supple, no JVD LYMPH:  Minimal palpable supraclavicular and axillary LNadenopathy, no palpable lymphadenopathy in the inguinal region LUNGS: clear to auscultation b/l with normal respiratory effort HEART: regular rate & rhythm ABDOMEN:  normoactive bowel sounds , non tender, not distended. Extremity: no pedal edema PSYCH: alert & oriented x 3 with fluent speech NEURO: no focal motor/sensory deficits   LABORATORY DATA:  I have reviewed the data as listed  . CBC Latest Ref Rng & Units 12/07/2017 11/30/2017 10/30/2017  WBC 4.0 - 10.3 K/uL 10.9(H) 26.5(H) 17.9(H)  Hemoglobin 13.0 - 17.1 g/dL 16.6 16.0 16.4  Hematocrit 38.4 - 49.9 % 48.6 47.8 49.1  Platelets 140 - 400 K/uL 229 182 217    . CMP Latest Ref Rng & Units 12/07/2017 11/30/2017 10/30/2017  Glucose 70 - 140 mg/dL 108 143(H) 103  BUN 7 - 26 mg/dL _0 Creatinine 0.70 - 1.30 mg/dL 0.79 0.77 1.18  Sodium 136 - 145 mmol/L 135(L) 140 140  Potassium 3.5 - 5.1 mmol/L 4.2 4.0 4.3  Chloride 98 - 109 mmol/L 102 107 104  CO2 22 - 29 mmol/L _1 Calcium 8.4 - 10.4 mg/dL 10.0 9.3 10.0  Total Protein 6.4 - 8.3 g/dL 7.1 6.8 7.2  Total Bilirubin 0.2 - 1.2 mg/dL 0.6 0.5 0.6  Alkaline Phos  40 - 150 U/L 90 85 82  AST 5 - 34 U/L _2 ALT 0 - 55 U/L _3 Component     Latest Ref Rng & Units 10/30/2017  HIV Screen 4th Generation wRfx     Non Reactive Non Reactive  HCV Ab     0.0 - 0.9 s/co ratio <0.1  Hepatitis B Surface Ag     Negative Negative  Hep B Core Ab, Tot     Negative Negative      10/25/17 Tissue Flow Cytometry:    10/25/17 Needle/core Bx:   [facility]  Order: [id number]  Status:  Final result Visible to patient:  No (Not Released) Next appt:  11/07/2017 at 09:00 AM in Radiology ([facility]) Dx:  Lymphocytosis  Component 55moago  Specimen Type Comment:   Comment: BLOOD  Cells Counted 200   Cells Analyzed 200   FISH Result Comment:   Comment: NORMAL: NO CYCLIN D1 OR IGH GENE REARRANGEMENT OBSERVED  Interpretation Comment:   Comment: (NOTE)        nuc ish 11q13(CCND1x2),14q32(IGHx2)[200]    The fluorescence in situ hybridization (FISH) result  was normal. Dual color dual fusion DNA FISH probes targeting  the cyclin D1 gene (CCND1 or BCL1)([employer] and the  heavy chain immunoglobulin gene (IgH) at 14q32 showed two  hybridization signals each with no fusion in all cells  examined. Thus, there was NO apparent rearrangement of the  primary genes involved with mantle cell lymphoma, and to a  lesser extent myeloma. .Marland Kitchen        RADIOGRAPHIC STUDIES: I have personally reviewed the radiological images as listed and agreed with the findings in the report. Dg Chest 2 View  Result Date: 11/07/2017 CLINICAL DATA:  Two weeks of cough. The patient is about to began chemotherapy for lymphoma. EXAM: CHEST - 2 VIEW COMPARISON:  CT scan of the chest of October 11, 2017 FINDINGS: There are hazy increased lung markings  in the left lower lobe and in the left perihilar region. There is no definite abnormality on the right. There is no pleural effusion. The cardiac silhouette is top-normal to mildly enlarged. The pulmonary vascularity is  engorged. IMPRESSION: Right lower lobe and right perihilar infiltrate most compatible with pneumonia. Followup PA and lateral chest X-ray is recommended in 3-4 weeks following trial of antibiotic therapy to ensure resolution and exclude underlying malignancy. Electronically Signed   By: David  Martinique M.D.   On: 11/07/2017 15:55    ASSESSMENT & PLAN:   37 y.o. male with  1. 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  -Reviewed 10/11/17 CT of C/A/P which revealed lymphadenopathy throughout the chest, abdomen and pelvis. Most notably in the axilla and abdomen.   -Noted 13q deletion: 13q14.3 is at 47.67% for normal nuclei, and 54.00% with mono allelic deletion of [id number].  -ATM for 11q22.3 revealed 81.67% with normal nuclei and 18.33% with positive nuclei for del 11q22.3 ---> point more to CLL/SLL   PLAN  -Discussed pt labwork today, 12/07/17; WBC have decreased to 10.9k and Lymphs have decreased to 3.5k, Phosphorous elevated. Kidney functions are normal.  -Pt tolerated first cycle very well. -had significant flushing with rituxan needing steroids - will hold off rapid infusion protocol. -Will drop allopurinol prior to C2 -Will see pt back on C2D1 -Advised that pt continue to stay well hydrated    Please cancel labs and MD appointment for 12/14/2017 RTC for C2 of treatment with labs and MD visit as per schedule on 12/28/2017   All of the patients questions were answered with apparent satisfaction. The patient knows to call the clinic with any problems, questions or concerns.  The total time spent in the appt was 20 minutes and more than 50% was on counseling and direct patient cares.    Sullivan Lone MD MS AAHIVMS [facility] Hematology/Oncology Physician [facility]  (Office):       [phone] (Work cell):  [phone] (Fax):           [phone]  12/07/2017 3:24 PM  I, Baldwin Jamaica, am acting as a Education administrator for Dr Irene Limbo.   .I have reviewed the above documentation for accuracy and completeness, and I agree with the above. Brunetta Genera MD

## 2017-12-14 ENCOUNTER — Other Ambulatory Visit: Payer: Managed Care, Other (non HMO)

## 2017-12-14 ENCOUNTER — Ambulatory Visit: Payer: Managed Care, Other (non HMO) | Admitting: Hematology

## 2017-12-27 NOTE — Progress Notes (Signed)
HEMATOLOGY/ONCOLOGY CLINIC NOTE  Date of Service: 12/28/17  Patient Care Team: [name], MD as PCP - General (Family Medicine)  CHIEF COMPLAINTS/PURPOSE OF CONSULTATION:  F/u for mx of CLL/SLL  HISTORY OF PRESENTING ILLNESS:   Jordan Schroeder is a wonderful 37 y.o. 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 He is accompanied today by his mother. The pt reports that he is doing well overall.   The pt reports that he has not had any significant medical problems in the past. He notes that on an annual physical, his PCP noticed his WBC was high, and after repeat blood tests, he was referred to Dr. Nicholas Lose. Dr. Lindi Adie has already begun the initial work ups as noted below. The pt notes that he does not notice any change in his energy levels or feeling any different over the last 6 months.   He notes that he has some concerns for sleep apnea as he snores a lot at night but does not wake up due to SOB.   Of note prior to the patient's visit today, pt has had CT C/A/P completed on 10/11/17 with results revealing Lymphadenopathy throughout the chest, abdomen, and pelvis.   On 09/21/17 the pt also had a Flow Cytometry revealing MONOCLONAL B-CELL POPULATION IDENTIFIED.  Most recent lab results (09/21/17) of CBC  is as follows: all values are WNL except for WBC at 23.4k, Lymphs Abs at 15.9k, Monocytes Abs at 1.1k. LDH 09/21/17 was WNL at 214. FISH 09/21/12 is pending.  [facility] 10/05/17 revealed CEP 12 at 100%,  13q14.3 is at 47.67% for normal nuclei, and 92.42% with mono allelic deletion of [id number]. CL TP53 for 17p13 revealed 100% with normal nuclei.  ATM for 11q22.3 revealed 81.67% with normal nuclei and 18.33% with positive nuclei for del 11q22.3  On review of systems, pt reports some lower abdominal pain, good energy levels, and denies fevers, chills, night sweats, weight loss, diarrhea, blood in the stools,  black stools, problems passing urine, and any other symptoms.   On PMHx the pt reports HTN. . On Social Hx the pt denies smoking at any point, any ETOH consumption, and illicit drug use.  Interval History:  Jordan Schroeder returns today regarding his recently diagnosed CLL/SLL and prior to C2 of BR treatment. The patient's last visit with [place] was on 12/07/17. The pt reports that he is doing well overall.   The pt reports that his nausea has been virtually non-existent and he has continued to work full time without any trouble. He adds that he is emotionally doing well and has no new concerns. He notes that he continues to stay active each day.   Lab results today (12/28/17) of CBC, CMP, and Reticulocytes is as follows: all values are WNL except for Glucose at 122. Uric Acid 12/28/17 is normal at 1.9  On review of systems, pt reports good energy levels, and denies nausea, fatigue, night sweats, fevers, chills, mouth sores, vision changes, and any other symptoms.   MEDICAL HISTORY:  Past Medical History:  Diagnosis Date  . Hypertension     SURGICAL HISTORY: No past surgical history on file.  SOCIAL HISTORY: Social History   Socioeconomic History  . Marital status: Married    Spouse name: Not on file  . Number of children: Not on file  . Years of education: Not on file  . Highest education level: Not on file  Occupational History  . Not on file  Social Needs  . Financial resource strain: Not on file  . Food insecurity:    Worry: Not on file    Inability: Not on file  . Transportation needs:    Medical: Not on file    Non-medical: Not on file  Tobacco Use  . Smoking status: Never Smoker  . Smokeless tobacco: Never Used  Substance and Sexual Activity  . Alcohol use: Not Currently  . Drug use: Never  . Sexual activity: Not on file  Lifestyle  . Physical activity:    Days per week: Not on file    Minutes per session: Not on file  . Stress: Not on file  Relationships  .  Social connections:    Talks on phone: Not on file    Gets together: Not on file    Attends religious service: Not on file    Active member of club or organization: Not on file    Attends meetings of clubs or organizations: Not on file    Relationship status: Not on file  . Intimate partner violence:    Fear of current or ex partner: Not on file    Emotionally abused: Not on file    Physically abused: Not on file    Forced sexual activity: Not on file  Other Topics Concern  . Not on file  Social History Narrative  . Not on file    FAMILY HISTORY: No family history on file.  ALLERGIES:  has No Known Allergies.  MEDICATIONS:  Current Outpatient Medications  Medication Sig Dispense Refill  . acyclovir (ZOVIRAX) 400 MG tablet Take 1 tablet (400 mg total) by mouth daily. 30 tablet 3  . allopurinol (ZYLOPRIM) 300 MG tablet Take 1 tablet (300 mg total) by mouth daily. 30 tablet 0  . dexamethasone (DECADRON) 4 MG tablet Take 2 tablets (8 mg total) by mouth daily. Start the day after bendamustine chemotherapy for 2 days. Take with food. 30 tablet 1  . lisinopril (PRINIVIL,ZESTRIL) 10 MG tablet Take 10 mg by mouth daily.    . ondansetron (ZOFRAN) 8 MG tablet Take 1 tablet (8 mg total) by mouth 2 (two) times daily as needed for refractory nausea / vomiting. Start on day 2 after bendamustine chemo. 30 tablet 1  . prochlorperazine (COMPAZINE) 10 MG tablet Take 1 tablet (10 mg total) by mouth every 6 (six) hours as needed (Nausea or vomiting). 30 tablet 1  . sertraline (ZOLOFT) 100 MG tablet Take 100 mg by mouth daily.     No current facility-administered medications for this visit.     REVIEW OF SYSTEMS:    A 10+ POINT REVIEW OF SYSTEMS WAS OBTAINED including neurology, dermatology, psychiatry, cardiac, respiratory, lymph, extremities, GI, GU, Musculoskeletal, constitutional, breasts, reproductive, HEENT.  All pertinent positives are noted in the HPI.  All others are negative.   PHYSICAL  EXAMINATION: ECOG PERFORMANCE STATUS: 1 - Symptomatic but completely ambulatory  Vitals:   12/28/17 0854  BP: 112/80  Pulse: (!) 107  Resp: 18  Temp: 98.4 F (36.9 C)  SpO2: 97%   Filed Weights   12/28/17 0854  Weight: 279 lb 11.2 oz (126.9 kg)   .Body mass index is 42.53 kg/m.  GENERAL:alert, in no acute distress and comfortable SKIN: no acute rashes, no significant lesions EYES: conjunctiva are pink and non-injected, sclera anicteric OROPHARYNX: MMM, no exudates, no oropharyngeal erythema or ulceration NECK: supple, no JVD LYMPH:  Minimal palpable supraclavicular and axillary LNadenopathy,  no palpable lymphadenopathy in the inguinal region LUNGS: clear to auscultation b/l with normal respiratory effort HEART: regular rate & rhythm ABDOMEN:  normoactive bowel sounds , non tender, not distended. Extremity: no pedal edema PSYCH: alert & oriented x 3 with fluent speech NEURO: no focal motor/sensory deficits   LABORATORY DATA:  I have reviewed the data as listed  . CBC Latest Ref Rng & Units 12/28/2017 12/07/2017 11/30/2017  WBC 4.0 - 10.3 K/uL 5.3 10.9(H) 26.5(H)  Hemoglobin 13.0 - 17.1 g/dL 15.8 16.6 16.0  Hematocrit 38.4 - 49.9 % 47.0 48.6 47.8  Platelets 140 - 400 K/uL 144 229 182    . CMP Latest Ref Rng & Units 12/28/2017 12/07/2017 11/30/2017  Glucose 70 - 99 mg/dL 122(H) 108 143(H)  BUN 6 - 20 mg/dL _0 Creatinine 0.61 - 1.24 mg/dL 0.69 0.79 0.77  Sodium 135 - 145 mmol/L 140 135(L) 140  Potassium 3.5 - 5.1 mmol/L 3.9 4.2 4.0  Chloride 98 - 111 mmol/L 107 102 107  CO2 22 - 32 mmol/L _1 Calcium 8.9 - 10.3 mg/dL 9.6 10.0 9.3  Total Protein 6.5 - 8.1 g/dL 6.6 7.1 6.8  Total Bilirubin 0.3 - 1.2 mg/dL 0.6 0.6 0.5  Alkaline Phos 38 - 126 U/L 75 90 85  AST 15 - 41 U/L _2 ALT 0 - 44 U/L 33 21 22   Component     Latest Ref Rng & Units 10/30/2017  HIV Screen 4th Generation wRfx     Non Reactive Non Reactive  HCV Ab     0.0 - 0.9 s/co ratio <0.1    Hepatitis B Surface Ag     Negative Negative  Hep B Core Ab, Tot     Negative Negative      10/25/17 Tissue Flow Cytometry:    10/25/17 Needle/core Bx:   [facility]  Order: [id number]  Status:  Final result Visible to patient:  No (Not Released) Next appt:  11/07/2017 at 09:00 AM in Radiology ([facility]) Dx:  Lymphocytosis  Component 49moago  Specimen Type Comment:   Comment: BLOOD  Cells Counted 200   Cells Analyzed 200   FISH Result Comment:   Comment: NORMAL: NO CYCLIN D1 OR IGH GENE REARRANGEMENT OBSERVED  Interpretation Comment:   Comment: (NOTE)        nuc ish 11q13(CCND1x2),14q32(IGHx2)[200]    The fluorescence in situ hybridization (FISH) result  was normal. Dual color dual fusion DNA FISH probes targeting  the cyclin D1 gene (CCND1 or BCL1)([employer] and the  heavy chain immunoglobulin gene (IgH) at 14q32 showed two  hybridization signals each with no fusion in all cells  examined. Thus, there was NO apparent rearrangement of the  primary genes involved with mantle cell lymphoma, and to a  lesser extent myeloma. .Marland Kitchen        RADIOGRAPHIC STUDIES: I have personally reviewed the radiological images as listed and agreed with the findings in the report. No results found.  ASSESSMENT & PLAN:   37y.o. male with  1. 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  -Reviewed 10/11/17 CT of C/A/P which revealed lymphadenopathy throughout the chest, abdomen and pelvis. Most notably in the axilla and abdomen.   -Noted 13q deletion:  13q14.3 is at 47.67% for normal nuclei, and 99.35% with mono allelic deletion of [id number].  -ATM for 11q22.3 revealed 81.67% with normal nuclei and 18.33% with positive nuclei for del 11q22.3  ---> point more to CLL/SLL   PLAN -Had significant flushing with rituxan needing steroids - will hold off rapid infusion protocol. -Discussed pt labwork today, 12/28/17; blood counts have completely normalized including WBC down to 5.3k and Lymphs abs down to 1.0k -Pt has tolerated C1 well -The pt has no prohibitive toxicities from continuing with C2 of his BR treatment at this time.   -Good to hold allopurinol at this time with uric acid 12/28/17 WNL -Continue with acyclovir -Continue staying well hydrated, walk 20-30 minutes each day, and stay well nourished  -Will see pt back on C3D1 unless he develops any new or concerning symptoms before then   RTC with dr Irene Limbo in 4 weeks and 8 weeks with D1 of each cycle of BR  F/u for C3 and C4 of chemotherapy with labs as scheduled    All of the patients questions were answered with apparent satisfaction. The patient knows to call the clinic with any problems, questions or concerns.  The toal time spent in the appt was 20 minutes and more than 50% was on counseling and direct patient cares.    Sullivan Lone MD MS AAHIVMS [facility] Hematology/Oncology Physician [facility]  (Office):       [phone] (Work cell):  [phone] (Fax):           [phone]  12/28/2017 9:17 AM   I, Baldwin Jamaica, am acting as a Education administrator for Dr Irene Limbo.   .I have reviewed the above documentation for accuracy and completeness, and I agree with the above. Brunetta Genera MD

## 2017-12-28 ENCOUNTER — Inpatient Hospital Stay: Payer: Managed Care, Other (non HMO)

## 2017-12-28 ENCOUNTER — Inpatient Hospital Stay (HOSPITAL_BASED_OUTPATIENT_CLINIC_OR_DEPARTMENT_OTHER): Payer: Managed Care, Other (non HMO) | Admitting: Hematology

## 2017-12-28 ENCOUNTER — Other Ambulatory Visit: Payer: Self-pay | Admitting: Hematology

## 2017-12-28 ENCOUNTER — Encounter: Payer: Self-pay | Admitting: Hematology

## 2017-12-28 VITALS — BP 121/71 | HR 99 | Temp 98.1°F | Resp 18

## 2017-12-28 VITALS — BP 112/80 | HR 107 | Temp 98.4°F | Resp 18 | Ht 68.0 in | Wt 279.7 lb

## 2017-12-28 DIAGNOSIS — M109 Gout, unspecified: Secondary | ICD-10-CM

## 2017-12-28 DIAGNOSIS — Z5112 Encounter for antineoplastic immunotherapy: Secondary | ICD-10-CM

## 2017-12-28 DIAGNOSIS — C8308 Small cell B-cell lymphoma, lymph nodes of multiple sites: Secondary | ICD-10-CM

## 2017-12-28 DIAGNOSIS — Z7189 Other specified counseling: Secondary | ICD-10-CM

## 2017-12-28 DIAGNOSIS — I10 Essential (primary) hypertension: Secondary | ICD-10-CM | POA: Diagnosis not present

## 2017-12-28 DIAGNOSIS — Z79899 Other long term (current) drug therapy: Secondary | ICD-10-CM

## 2017-12-28 DIAGNOSIS — Z5111 Encounter for antineoplastic chemotherapy: Secondary | ICD-10-CM | POA: Diagnosis not present

## 2017-12-28 LAB — CBC WITH DIFFERENTIAL/PLATELET
BASOS ABS: 0 10*3/uL (ref 0.0–0.1)
Basophils Relative: 1 %
EOS PCT: 10 %
Eosinophils Absolute: 0.5 10*3/uL (ref 0.0–0.5)
HCT: 47 % (ref 38.4–49.9)
Hemoglobin: 15.8 g/dL (ref 13.0–17.1)
LYMPHS PCT: 20 %
Lymphs Abs: 1 10*3/uL (ref 0.9–3.3)
MCH: 29.9 pg (ref 27.2–33.4)
MCHC: 33.6 g/dL (ref 32.0–36.0)
MCV: 89 fL (ref 79.3–98.0)
MONO ABS: 0.8 10*3/uL (ref 0.1–0.9)
MONOS PCT: 16 %
NEUTROS ABS: 2.8 10*3/uL (ref 1.5–6.5)
Neutrophils Relative %: 53 %
Platelets: 144 10*3/uL (ref 140–400)
RBC: 5.28 MIL/uL (ref 4.20–5.82)
RDW: 14.5 % (ref 11.0–14.6)
WBC: 5.3 10*3/uL (ref 4.0–10.3)

## 2017-12-28 LAB — CMP (CANCER CENTER ONLY)
ALBUMIN: 4.1 g/dL (ref 3.5–5.0)
ALT: 33 U/L (ref 0–44)
AST: 21 U/L (ref 15–41)
Alkaline Phosphatase: 75 U/L (ref 38–126)
Anion gap: 8 (ref 5–15)
BILIRUBIN TOTAL: 0.6 mg/dL (ref 0.3–1.2)
BUN: 12 mg/dL (ref 6–20)
CO2: 25 mmol/L (ref 22–32)
Calcium: 9.6 mg/dL (ref 8.9–10.3)
Chloride: 107 mmol/L (ref 98–111)
Creatinine: 0.69 mg/dL (ref 0.61–1.24)
GFR, Est AFR Am: >60 mL/min (ref >60)
GFR, Estimated: >60 mL/min (ref >60)
GLUCOSE: 122 mg/dL — AB (ref 70–99)
POTASSIUM: 3.9 mmol/L (ref 3.5–5.1)
SODIUM: 140 mmol/L (ref 135–145)
TOTAL PROTEIN: 6.6 g/dL (ref 6.5–8.1)

## 2017-12-28 LAB — URIC ACID: URIC ACID, SERUM: 4.8 mg/dL (ref 3.7–8.6)

## 2017-12-28 LAB — MAGNESIUM: Magnesium: 1.9 mg/dL (ref 1.7–2.4)

## 2017-12-28 MED ORDER — PALONOSETRON HCL INJECTION 0.25 MG/5ML
0.2500 mg | Freq: Once | INTRAVENOUS | Status: AC
  Administered 2017-12-28: 0.25 mg via INTRAVENOUS

## 2017-12-28 MED ORDER — BENDAMUSTINE HCL CHEMO INJECTION 100 MG/4ML
90.0000 mg/m2 | Freq: Once | INTRAVENOUS | Status: AC
  Administered 2017-12-28: 225 mg via INTRAVENOUS
  Filled 2017-12-28: qty 9

## 2017-12-28 MED ORDER — DEXAMETHASONE SODIUM PHOSPHATE 10 MG/ML IJ SOLN
INTRAMUSCULAR | Status: AC
  Filled 2017-12-28: qty 1

## 2017-12-28 MED ORDER — SODIUM CHLORIDE 0.9 % IV SOLN
375.0000 mg/m2 | Freq: Once | INTRAVENOUS | Status: AC
  Administered 2017-12-28: 900 mg via INTRAVENOUS
  Filled 2017-12-28: qty 40

## 2017-12-28 MED ORDER — DEXAMETHASONE SODIUM PHOSPHATE 10 MG/ML IJ SOLN
10.0000 mg | Freq: Once | INTRAMUSCULAR | Status: AC
  Administered 2017-12-28: 10 mg via INTRAVENOUS

## 2017-12-28 MED ORDER — ACETAMINOPHEN 325 MG PO TABS
ORAL_TABLET | ORAL | Status: AC
  Filled 2017-12-28: qty 2

## 2017-12-28 MED ORDER — SODIUM CHLORIDE 0.9 % IV SOLN
Freq: Once | INTRAVENOUS | Status: AC
  Administered 2017-12-28: 10:00:00 via INTRAVENOUS

## 2017-12-28 MED ORDER — DIPHENHYDRAMINE HCL 25 MG PO CAPS
50.0000 mg | ORAL_CAPSULE | Freq: Once | ORAL | Status: AC
  Administered 2017-12-28: 50 mg via ORAL

## 2017-12-28 MED ORDER — PALONOSETRON HCL INJECTION 0.25 MG/5ML
INTRAVENOUS | Status: AC
  Filled 2017-12-28: qty 5

## 2017-12-28 MED ORDER — ACETAMINOPHEN 325 MG PO TABS
650.0000 mg | ORAL_TABLET | Freq: Once | ORAL | Status: AC
  Administered 2017-12-28: 650 mg via ORAL

## 2017-12-28 MED ORDER — DIPHENHYDRAMINE HCL 25 MG PO CAPS
ORAL_CAPSULE | ORAL | Status: AC
  Filled 2017-12-28: qty 2

## 2017-12-28 NOTE — Patient Instructions (Signed)
[facility] Discharge Instructions for Patients Receiving Chemotherapy  Today you received the following chemotherapy agents:  Rituxan and Bendeka.  To help prevent nausea and vomiting after your treatment, we encourage you to take your nausea medication as directed.   If you develop nausea and vomiting that is not controlled by your nausea medication, call the clinic.   BELOW ARE SYMPTOMS THAT SHOULD BE REPORTED IMMEDIATELY:  *FEVER GREATER THAN 100.5 F  *CHILLS WITH OR WITHOUT FEVER  NAUSEA AND VOMITING THAT IS NOT CONTROLLED WITH YOUR NAUSEA MEDICATION  *UNUSUAL SHORTNESS OF BREATH  *UNUSUAL BRUISING OR BLEEDING  TENDERNESS IN MOUTH AND THROAT WITH OR WITHOUT PRESENCE OF ULCERS  *URINARY PROBLEMS  *BOWEL PROBLEMS  UNUSUAL RASH Items with * indicate a potential emergency and should be followed up as soon as possible.  Feel free to call the clinic should you have any questions or concerns. The clinic phone number is [phone].  Please show the CHEMO ALERT CARD at check-in to the Emergency Department and triage nurse.   

## 2017-12-29 ENCOUNTER — Inpatient Hospital Stay: Payer: Managed Care, Other (non HMO)

## 2017-12-29 VITALS — BP 114/71 | HR 97 | Temp 98.0°F | Resp 18

## 2017-12-29 DIAGNOSIS — Z7189 Other specified counseling: Secondary | ICD-10-CM

## 2017-12-29 DIAGNOSIS — C8308 Small cell B-cell lymphoma, lymph nodes of multiple sites: Secondary | ICD-10-CM

## 2017-12-29 MED ORDER — DEXAMETHASONE SODIUM PHOSPHATE 10 MG/ML IJ SOLN
10.0000 mg | Freq: Once | INTRAMUSCULAR | Status: AC
  Administered 2017-12-29: 10 mg via INTRAVENOUS

## 2017-12-29 MED ORDER — SODIUM CHLORIDE 0.9 % IV SOLN
Freq: Once | INTRAVENOUS | Status: AC
Start: 2017-12-29 — End: 2017-12-29
  Administered 2017-12-29: 11:00:00 via INTRAVENOUS

## 2017-12-29 MED ORDER — DEXAMETHASONE SODIUM PHOSPHATE 10 MG/ML IJ SOLN
INTRAMUSCULAR | Status: AC
  Filled 2017-12-29: qty 1

## 2017-12-29 MED ORDER — SODIUM CHLORIDE 0.9 % IV SOLN
90.0000 mg/m2 | Freq: Once | INTRAVENOUS | Status: AC
  Administered 2017-12-29: 225 mg via INTRAVENOUS
  Filled 2017-12-29: qty 9

## 2017-12-29 NOTE — Patient Instructions (Signed)
[facility] Discharge Instructions for Patients Receiving Chemotherapy  Today you received the following chemotherapy agent: Bendeka  To help prevent nausea and vomiting after your treatment, we encourage you to take your nausea medication as directed.   If you develop nausea and vomiting that is not controlled by your nausea medication, call the clinic.   BELOW ARE SYMPTOMS THAT SHOULD BE REPORTED IMMEDIATELY:  *FEVER GREATER THAN 100.5 F  *CHILLS WITH OR WITHOUT FEVER  NAUSEA AND VOMITING THAT IS NOT CONTROLLED WITH YOUR NAUSEA MEDICATION  *UNUSUAL SHORTNESS OF BREATH  *UNUSUAL BRUISING OR BLEEDING  TENDERNESS IN MOUTH AND THROAT WITH OR WITHOUT PRESENCE OF ULCERS  *URINARY PROBLEMS  *BOWEL PROBLEMS  UNUSUAL RASH Items with * indicate a potential emergency and should be followed up as soon as possible.  Feel free to call the clinic should you have any questions or concerns. The clinic phone number is [phone].  Please show the CHEMO ALERT CARD at check-in to the Emergency Department and triage nurse.   

## 2018-01-24 NOTE — Progress Notes (Signed)
HEMATOLOGY/ONCOLOGY CLINIC NOTE  Date of Service: 01/25/18  Patient Care Team: [name], MD as PCP - General (Family Medicine)  CHIEF COMPLAINTS/PURPOSE OF CONSULTATION:  F/u for mx of CLL/SLL  HISTORY OF PRESENTING ILLNESS:   Jordan Schroeder is a wonderful 37 y.o. 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 He is accompanied today by his mother. The pt reports that he is doing well overall.   The pt reports that he has not had any significant medical problems in the past. He notes that on an annual physical, his PCP noticed his WBC was high, and after repeat blood tests, he was referred to Dr. Nicholas Lose. Dr. Lindi Adie has already begun the initial work ups as noted below. The pt notes that he does not notice any change in his energy levels or feeling any different over the last 6 months.   He notes that he has some concerns for sleep apnea as he snores a lot at night but does not wake up due to SOB.   Of note prior to the patient's visit today, pt has had CT C/A/P completed on 10/11/17 with results revealing Lymphadenopathy throughout the chest, abdomen, and pelvis.   On 09/21/17 the pt also had a Flow Cytometry revealing MONOCLONAL B-CELL POPULATION IDENTIFIED.  Most recent lab results (09/21/17) of CBC  is as follows: all values are WNL except for WBC at 23.4k, Lymphs Abs at 15.9k, Monocytes Abs at 1.1k. LDH 09/21/17 was WNL at 214. FISH 09/21/12 is pending.  [facility] 10/05/17 revealed CEP 12 at 100%,  13q14.3 is at 47.67% for normal nuclei, and 06.30% with mono allelic deletion of [id number]. CL TP53 for 17p13 revealed 100% with normal nuclei.  ATM for 11q22.3 revealed 81.67% with normal nuclei and 18.33% with positive nuclei for del 11q22.3  On review of systems, pt reports some lower abdominal pain, good energy levels, and denies fevers, chills, night sweats, weight loss, diarrhea, blood in the stools,  black stools, problems passing urine, and any other symptoms.   On PMHx the pt reports HTN. . On Social Hx the pt denies smoking at any point, any ETOH consumption, and illicit drug use.  Interval History:  Cristin Szatkowski returns today regarding his recently diagnosed CLL/SLL and prior to C3 of BR treatment. The patient's last visit with [place] was on 12/28/17. The pt reports that he is doing well overall.   The pt reports that he is continuing to tolerate treatment very well. He continues to work full time and has not had any constitutional symptoms present or any concerning symptoms.  Lab results today (01/25/18) of CBC w/diff, CMP is as follows: all values are WNL except for Eosinophils abs at 1.1k, Glucose at 124, Total Protein at 6.3.   On review of systems, pt reports good energy levels, and denies fevers, chills, night sweats, pain along the spine, abdominal pains, leg swelling, and any other symptoms.    MEDICAL HISTORY:  Past Medical History:  Diagnosis Date  . Hypertension     SURGICAL HISTORY: History reviewed. No pertinent surgical history.  SOCIAL HISTORY: Social History   Socioeconomic History  . Marital status: Married    Spouse name: Not on file  . Number of children: Not on file  . Years of education: Not on file  . Highest education level: Not on file  Occupational History  . Not on file  Social Needs  .  Financial resource strain: Not on file  . Food insecurity:    Worry: Not on file    Inability: Not on file  . Transportation needs:    Medical: Not on file    Non-medical: Not on file  Tobacco Use  . Smoking status: Never Smoker  . Smokeless tobacco: Never Used  Substance and Sexual Activity  . Alcohol use: Not Currently  . Drug use: Never  . Sexual activity: Not on file  Lifestyle  . Physical activity:    Days per week: Not on file    Minutes per session: Not on file  . Stress: Not on file  Relationships  . Social connections:    Talks on phone:  Not on file    Gets together: Not on file    Attends religious service: Not on file    Active member of club or organization: Not on file    Attends meetings of clubs or organizations: Not on file    Relationship status: Not on file  . Intimate partner violence:    Fear of current or ex partner: Not on file    Emotionally abused: Not on file    Physically abused: Not on file    Forced sexual activity: Not on file  Other Topics Concern  . Not on file  Social History Narrative  . Not on file    FAMILY HISTORY: History reviewed. No pertinent family history.  ALLERGIES:  has No Known Allergies.  MEDICATIONS:  Current Outpatient Medications  Medication Sig Dispense Refill  . acyclovir (ZOVIRAX) 400 MG tablet Take 1 tablet (400 mg total) by mouth daily. 30 tablet 3  . dexamethasone (DECADRON) 4 MG tablet Take 2 tablets (8 mg total) by mouth daily. Start the day after bendamustine chemotherapy for 2 days. Take with food. 30 tablet 1  . lisinopril (PRINIVIL,ZESTRIL) 10 MG tablet Take 10 mg by mouth daily.    . ondansetron (ZOFRAN) 8 MG tablet Take 1 tablet (8 mg total) by mouth 2 (two) times daily as needed for refractory nausea / vomiting. Start on day 2 after bendamustine chemo. 30 tablet 1  . prochlorperazine (COMPAZINE) 10 MG tablet Take 1 tablet (10 mg total) by mouth every 6 (six) hours as needed (Nausea or vomiting). 30 tablet 1  . sertraline (ZOLOFT) 100 MG tablet Take 100 mg by mouth daily.     No current facility-administered medications for this visit.     REVIEW OF SYSTEMS:    A 10+ POINT REVIEW OF SYSTEMS WAS OBTAINED including neurology, dermatology, psychiatry, cardiac, respiratory, lymph, extremities, GI, GU, Musculoskeletal, constitutional, breasts, reproductive, HEENT.  All pertinent positives are noted in the HPI.  All others are negative.   PHYSICAL EXAMINATION: ECOG PERFORMANCE STATUS: 1 - Symptomatic but completely ambulatory  Vitals:   01/25/18 0843  BP:  117/78  Pulse: (!) 102  Resp: 18  Temp: 98 F (36.7 C)  SpO2: 95%   Filed Weights   01/25/18 0843  Weight: 284 lb 1.6 oz (128.9 kg)   .Body mass index is 43.2 kg/m.  GENERAL:alert, in no acute distress and comfortable SKIN: no acute rashes, no significant lesions EYES: conjunctiva are pink and non-injected, sclera anicteric OROPHARYNX: MMM, no exudates, no oropharyngeal erythema or ulceration NECK: supple, no JVD LYMPH:  Minimal palpable supraclavicular and axillary LNadenopathy, no palpable lymphadenopathy in the inguinal region LUNGS: clear to auscultation b/l with normal respiratory effort HEART: regular rate & rhythm ABDOMEN:  normoactive bowel sounds , non tender, not  distended. No palpable hepatosplenomegaly.  Extremity: no pedal edema PSYCH: alert & oriented x 3 with fluent speech NEURO: no focal motor/sensory deficits   LABORATORY DATA:  I have reviewed the data as listed  . CBC Latest Ref Rng & Units 01/25/2018 12/28/2017 12/07/2017  WBC 4.0 - 10.3 K/uL 4.9 5.3 10.9(H)  Hemoglobin 13.0 - 17.1 g/dL 16.0 15.8 16.6  Hematocrit 38.4 - 49.9 % 47.3 47.0 48.6  Platelets 140 - 400 K/uL 162 144 229    . CMP Latest Ref Rng & Units 01/25/2018 12/28/2017 12/07/2017  Glucose 70 - 99 mg/dL 124(H) 122(H) 108  BUN 6 - 20 mg/dL _0 Creatinine 0.61 - 1.24 mg/dL 0.69 0.69 0.79  Sodium 135 - 145 mmol/L 141 140 135(L)  Potassium 3.5 - 5.1 mmol/L 4.1 3.9 4.2  Chloride 98 - 111 mmol/L 108 107 102  CO2 22 - 32 mmol/L _1 Calcium 8.9 - 10.3 mg/dL 9.5 9.6 10.0  Total Protein 6.5 - 8.1 g/dL 6.3(L) 6.6 7.1  Total Bilirubin 0.3 - 1.2 mg/dL 0.6 0.6 0.6  Alkaline Phos 38 - 126 U/L 81 75 90  AST 15 - 41 U/L _2 ALT 0 - 44 U/L 42 33 21   Component     Latest Ref Rng & Units 10/30/2017  HIV Screen 4th Generation wRfx     Non Reactive Non Reactive  HCV Ab     0.0 - 0.9 s/co ratio <0.1  Hepatitis B Surface Ag     Negative Negative  Hep B Core Ab, Tot     Negative Negative       10/25/17 Tissue Flow Cytometry:    10/25/17 Needle/core Bx:   FISH Oncology  Order: [id number]  Status:  Final result Visible to patient:  No (Not Released) Next appt:  11/07/2017 at 09:00 AM in Radiology ([facility] PET) Dx:  Lymphocytosis  Component 52moago  Specimen Type Comment:   Comment: BLOOD  Cells Counted 200   Cells Analyzed 200   FISH Result Comment:   Comment: NORMAL: NO CYCLIN D1 OR IGH GENE REARRANGEMENT OBSERVED  Interpretation Comment:   Comment: (NOTE)        nuc ish 11q13(CCND1x2),14q32(IGHx2)[200]    The fluorescence in situ hybridization (FISH) result  was normal. Dual color dual fusion DNA FISH probes targeting  the cyclin D1 gene (CCND1 or BCL1)([employer] and the  heavy chain immunoglobulin gene (IgH) at 14q32 showed two  hybridization signals each with no fusion in all cells  examined. Thus, there was NO apparent rearrangement of the  primary genes involved with mantle cell lymphoma, and to a  lesser extent myeloma. .Marland Kitchen        RADIOGRAPHIC STUDIES: I have personally reviewed the radiological images as listed and agreed with the findings in the report. No results found.  ASSESSMENT & PLAN:   37y.o. male with  1. 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  -Reviewed 10/11/17 CT of C/A/P which revealed lymphadenopathy throughout the chest, abdomen and pelvis. Most notably in the axilla and abdomen.   -Noted 13q deletion: 13q14.3 is at 47.67% for normal nuclei, and 345.03%with mono allelic deletion of [id number]  -ATM for 11q22.3 revealed 81.67% with normal nuclei and 18.33% with  positive nuclei for del 11q22.3 ---> point more to CLL/SLL   2. Rituxan infusion reaction-  -Had significant flushing  with rituxan needing steroids - will hold off rapid infusion protocol. PLAN:  -Discussed pt labwork today, 01/25/18; HGB normal at 16.0, Lymphs abs normal at 900, PLT normal at 162. Eosinophils abs elevated at 1.1k. Chemistries are stable.  -The pt has no prohibitive toxicities from continuing C3 at this time.  -Will order PET/CT at the end of C3 -Will refill Acyclovir today -Will see pt back in 4 weeks with PET/CT  -Continue staying hydrated, staying active and staying well nourished   PET/CT in 3 weeks Please schedule C4,5 and 6 as per orders with labs RTC with Dr Irene Limbo with each cycle of treatment with labs    All of the patients questions were answered with apparent satisfaction. The patient knows to call the clinic with any problems, questions or concerns.  The total time spent in the appt was 15 minutes and more than 50% was on counseling and direct patient cares.    Sullivan Lone MD MS AAHIVMS [facility] Hematology/Oncology Physician [facility] [address]  (Office):       [phone] (Work cell):  [phone] (Fax):           [phone]  01/25/2018 9:39 AM   I, Baldwin Jamaica, am acting as a Education administrator for Dr Irene Limbo.   .I have reviewed the above documentation for accuracy and completeness, and I agree with the above. Brunetta Genera MD

## 2018-01-25 ENCOUNTER — Inpatient Hospital Stay (HOSPITAL_BASED_OUTPATIENT_CLINIC_OR_DEPARTMENT_OTHER): Payer: Managed Care, Other (non HMO) | Admitting: Hematology

## 2018-01-25 ENCOUNTER — Inpatient Hospital Stay: Payer: Managed Care, Other (non HMO)

## 2018-01-25 ENCOUNTER — Telehealth: Payer: Self-pay | Admitting: Hematology

## 2018-01-25 ENCOUNTER — Encounter: Payer: Self-pay | Admitting: Hematology

## 2018-01-25 ENCOUNTER — Other Ambulatory Visit: Payer: Managed Care, Other (non HMO)

## 2018-01-25 ENCOUNTER — Inpatient Hospital Stay: Payer: Managed Care, Other (non HMO) | Attending: Hematology

## 2018-01-25 VITALS — BP 112/76 | HR 96 | Temp 97.6°F | Resp 18

## 2018-01-25 VITALS — BP 117/78 | HR 102 | Temp 98.0°F | Resp 18 | Ht 68.0 in | Wt 284.1 lb

## 2018-01-25 DIAGNOSIS — Z5111 Encounter for antineoplastic chemotherapy: Secondary | ICD-10-CM | POA: Diagnosis not present

## 2018-01-25 DIAGNOSIS — Z79899 Other long term (current) drug therapy: Secondary | ICD-10-CM | POA: Insufficient documentation

## 2018-01-25 DIAGNOSIS — R109 Unspecified abdominal pain: Secondary | ICD-10-CM | POA: Insufficient documentation

## 2018-01-25 DIAGNOSIS — C8308 Small cell B-cell lymphoma, lymph nodes of multiple sites: Secondary | ICD-10-CM

## 2018-01-25 DIAGNOSIS — I10 Essential (primary) hypertension: Secondary | ICD-10-CM | POA: Insufficient documentation

## 2018-01-25 DIAGNOSIS — Z7189 Other specified counseling: Secondary | ICD-10-CM

## 2018-01-25 LAB — CBC WITH DIFFERENTIAL/PLATELET
Basophils Absolute: 0 10*3/uL (ref 0.0–0.1)
Basophils Relative: 1 %
Eosinophils Absolute: 1.1 10*3/uL — ABNORMAL HIGH (ref 0.0–0.5)
Eosinophils Relative: 22 %
HCT: 47.3 % (ref 38.4–49.9)
HEMOGLOBIN: 16 g/dL (ref 13.0–17.1)
LYMPHS ABS: 0.9 10*3/uL (ref 0.9–3.3)
LYMPHS PCT: 18 %
MCH: 30.3 pg (ref 27.2–33.4)
MCHC: 33.9 g/dL (ref 32.0–36.0)
MCV: 89.2 fL (ref 79.3–98.0)
Monocytes Absolute: 0.7 10*3/uL (ref 0.1–0.9)
Monocytes Relative: 15 %
NEUTROS ABS: 2.2 10*3/uL (ref 1.5–6.5)
NEUTROS PCT: 44 %
Platelets: 162 10*3/uL (ref 140–400)
RBC: 5.3 MIL/uL (ref 4.20–5.82)
RDW: 14.4 % (ref 11.0–14.6)
WBC: 4.9 10*3/uL (ref 4.0–10.3)

## 2018-01-25 LAB — CMP (CANCER CENTER ONLY)
ALT: 42 U/L (ref 0–44)
ANION GAP: 8 (ref 5–15)
AST: 27 U/L (ref 15–41)
Albumin: 3.9 g/dL (ref 3.5–5.0)
Alkaline Phosphatase: 81 U/L (ref 38–126)
BUN: 11 mg/dL (ref 6–20)
CHLORIDE: 108 mmol/L (ref 98–111)
CO2: 25 mmol/L (ref 22–32)
Calcium: 9.5 mg/dL (ref 8.9–10.3)
Creatinine: 0.69 mg/dL (ref 0.61–1.24)
GFR, Est AFR Am: >60 mL/min (ref >60)
GFR, Estimated: >60 mL/min (ref >60)
Glucose, Bld: 124 mg/dL — ABNORMAL HIGH (ref 70–99)
POTASSIUM: 4.1 mmol/L (ref 3.5–5.1)
SODIUM: 141 mmol/L (ref 135–145)
Total Bilirubin: 0.6 mg/dL (ref 0.3–1.2)
Total Protein: 6.3 g/dL — ABNORMAL LOW (ref 6.5–8.1)

## 2018-01-25 MED ORDER — DIPHENHYDRAMINE HCL 25 MG PO CAPS
50.0000 mg | ORAL_CAPSULE | Freq: Once | ORAL | Status: AC
  Administered 2018-01-25: 50 mg via ORAL

## 2018-01-25 MED ORDER — SODIUM CHLORIDE 0.9 % IV SOLN
375.0000 mg/m2 | Freq: Once | INTRAVENOUS | Status: AC
  Administered 2018-01-25: 900 mg via INTRAVENOUS
  Filled 2018-01-25: qty 40

## 2018-01-25 MED ORDER — ACETAMINOPHEN 325 MG PO TABS
650.0000 mg | ORAL_TABLET | Freq: Once | ORAL | Status: AC
  Administered 2018-01-25: 650 mg via ORAL

## 2018-01-25 MED ORDER — ACYCLOVIR 400 MG PO TABS: 400.0000 mg | ORAL_TABLET | Freq: Every day | ORAL | 3 refills | Status: DC

## 2018-01-25 MED ORDER — HEPARIN SOD (PORK) LOCK FLUSH 100 UNIT/ML IV SOLN
500.0000 [IU] | Freq: Once | INTRAVENOUS | Status: DC | PRN
  Filled 2018-01-25: qty 5

## 2018-01-25 MED ORDER — PALONOSETRON HCL INJECTION 0.25 MG/5ML
INTRAVENOUS | Status: AC
  Filled 2018-01-25: qty 5

## 2018-01-25 MED ORDER — SODIUM CHLORIDE 0.9 % IV SOLN
Freq: Once | INTRAVENOUS | Status: AC
  Administered 2018-01-25: 10:00:00 via INTRAVENOUS
  Filled 2018-01-25: qty 250

## 2018-01-25 MED ORDER — DIPHENHYDRAMINE HCL 25 MG PO CAPS
ORAL_CAPSULE | ORAL | Status: AC
  Filled 2018-01-25: qty 2

## 2018-01-25 MED ORDER — DEXAMETHASONE SODIUM PHOSPHATE 10 MG/ML IJ SOLN
10.0000 mg | Freq: Once | INTRAMUSCULAR | Status: AC
  Administered 2018-01-25: 10 mg via INTRAVENOUS

## 2018-01-25 MED ORDER — PALONOSETRON HCL INJECTION 0.25 MG/5ML
0.2500 mg | Freq: Once | INTRAVENOUS | Status: AC
  Administered 2018-01-25: 0.25 mg via INTRAVENOUS

## 2018-01-25 MED ORDER — FAMOTIDINE IN NACL 20-0.9 MG/50ML-% IV SOLN
INTRAVENOUS | Status: AC
  Filled 2018-01-25: qty 50

## 2018-01-25 MED ORDER — SODIUM CHLORIDE 0.9% FLUSH
10.0000 mL | INTRAVENOUS | Status: DC | PRN
  Filled 2018-01-25: qty 10

## 2018-01-25 MED ORDER — ACETAMINOPHEN 325 MG PO TABS
ORAL_TABLET | ORAL | Status: AC
  Filled 2018-01-25: qty 2

## 2018-01-25 MED ORDER — SODIUM CHLORIDE 0.9 % IV SOLN
90.0000 mg/m2 | Freq: Once | INTRAVENOUS | Status: AC
  Administered 2018-01-25: 225 mg via INTRAVENOUS
  Filled 2018-01-25: qty 9

## 2018-01-25 MED ORDER — DEXAMETHASONE SODIUM PHOSPHATE 10 MG/ML IJ SOLN
INTRAMUSCULAR | Status: AC
  Filled 2018-01-25: qty 1

## 2018-01-25 NOTE — Telephone Encounter (Signed)
Gave patient avs and calendar of upcoming appts.  °

## 2018-01-25 NOTE — Patient Instructions (Signed)
[facility] Discharge Instructions for Patients Receiving Chemotherapy  Today you received the following chemotherapy agents:  Rituxan and Bendeka.  To help prevent nausea and vomiting after your treatment, we encourage you to take your nausea medication as directed.   If you develop nausea and vomiting that is not controlled by your nausea medication, call the clinic.   BELOW ARE SYMPTOMS THAT SHOULD BE REPORTED IMMEDIATELY:  *FEVER GREATER THAN 100.5 F  *CHILLS WITH OR WITHOUT FEVER  NAUSEA AND VOMITING THAT IS NOT CONTROLLED WITH YOUR NAUSEA MEDICATION  *UNUSUAL SHORTNESS OF BREATH  *UNUSUAL BRUISING OR BLEEDING  TENDERNESS IN MOUTH AND THROAT WITH OR WITHOUT PRESENCE OF ULCERS  *URINARY PROBLEMS  *BOWEL PROBLEMS  UNUSUAL RASH Items with * indicate a potential emergency and should be followed up as soon as possible.  Feel free to call the clinic should you have any questions or concerns. The clinic phone number is [phone].  Please show the CHEMO ALERT CARD at check-in to the Emergency Department and triage nurse.   

## 2018-01-26 ENCOUNTER — Inpatient Hospital Stay: Payer: Managed Care, Other (non HMO)

## 2018-01-26 VITALS — BP 121/79 | HR 95 | Temp 97.6°F | Resp 18

## 2018-01-26 DIAGNOSIS — C8308 Small cell B-cell lymphoma, lymph nodes of multiple sites: Secondary | ICD-10-CM | POA: Diagnosis not present

## 2018-01-26 DIAGNOSIS — Z7189 Other specified counseling: Secondary | ICD-10-CM

## 2018-01-26 MED ORDER — SODIUM CHLORIDE 0.9 % IV SOLN
Freq: Once | INTRAVENOUS | Status: AC
  Administered 2018-01-26: 09:00:00 via INTRAVENOUS
  Filled 2018-01-26: qty 250

## 2018-01-26 MED ORDER — SODIUM CHLORIDE 0.9 % IV SOLN
90.0000 mg/m2 | Freq: Once | INTRAVENOUS | Status: AC
  Administered 2018-01-26: 225 mg via INTRAVENOUS
  Filled 2018-01-26: qty 9

## 2018-01-26 MED ORDER — DEXAMETHASONE SODIUM PHOSPHATE 10 MG/ML IJ SOLN
INTRAMUSCULAR | Status: AC
  Filled 2018-01-26: qty 1

## 2018-01-26 MED ORDER — DEXAMETHASONE SODIUM PHOSPHATE 10 MG/ML IJ SOLN
10.0000 mg | Freq: Once | INTRAMUSCULAR | Status: AC
  Administered 2018-01-26: 10 mg via INTRAVENOUS

## 2018-01-26 NOTE — Patient Instructions (Signed)
[facility] Discharge Instructions for Patients Receiving Chemotherapy  Today you received the following chemotherapy agents Bendeka.   To help prevent nausea and vomiting after your treatment, we encourage you to take your nausea medication as prescribed.    If you develop nausea and vomiting that is not controlled by your nausea medication, call the clinic.   BELOW ARE SYMPTOMS THAT SHOULD BE REPORTED IMMEDIATELY:  *FEVER GREATER THAN 100.5 F  *CHILLS WITH OR WITHOUT FEVER  NAUSEA AND VOMITING THAT IS NOT CONTROLLED WITH YOUR NAUSEA MEDICATION  *UNUSUAL SHORTNESS OF BREATH  *UNUSUAL BRUISING OR BLEEDING  TENDERNESS IN MOUTH AND THROAT WITH OR WITHOUT PRESENCE OF ULCERS  *URINARY PROBLEMS  *BOWEL PROBLEMS  UNUSUAL RASH Items with * indicate a potential emergency and should be followed up as soon as possible.  Feel free to call the clinic should you have any questions or concerns. The clinic phone number is [phone].  Please show the CHEMO ALERT CARD at check-in to the Emergency Department and triage nurse.  

## 2018-02-15 ENCOUNTER — Encounter (HOSPITAL_COMMUNITY): Payer: Managed Care, Other (non HMO)

## 2018-02-16 ENCOUNTER — Other Ambulatory Visit: Payer: Self-pay | Admitting: Hematology

## 2018-02-16 DIAGNOSIS — C8308 Small cell B-cell lymphoma, lymph nodes of multiple sites: Secondary | ICD-10-CM

## 2018-02-21 NOTE — Progress Notes (Signed)
HEMATOLOGY/ONCOLOGY CLINIC NOTE  Date of Service: 02/22/18  Patient Care Team: [name], MD as PCP - General (Family Medicine)  CHIEF COMPLAINTS/PURPOSE OF CONSULTATION:  F/u for mx of CLL/SLL  HISTORY OF PRESENTING ILLNESS:   Jordan Schroeder is a wonderful 37 y.o. 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 He is accompanied today by his mother. The pt reports that he is doing well overall.   The pt reports that he has not had any significant medical problems in the past. He notes that on an annual physical, his PCP noticed his WBC was high, and after repeat blood tests, he was referred to Dr. Nicholas Lose. Dr. Lindi Adie has already begun the initial work ups as noted below. The pt notes that he does not notice any change in his energy levels or feeling any different over the last 6 months.   He notes that he has some concerns for sleep apnea as he snores a lot at night but does not wake up due to SOB.   Of note prior to the patient's visit today, pt has had CT C/A/P completed on 10/11/17 with results revealing Lymphadenopathy throughout the chest, abdomen, and pelvis.   On 09/21/17 the pt also had a Flow Cytometry revealing MONOCLONAL B-CELL POPULATION IDENTIFIED.  Most recent lab results (09/21/17) of CBC  is as follows: all values are WNL except for WBC at 23.4k, Lymphs Abs at 15.9k, Monocytes Abs at 1.1k. LDH 09/21/17 was WNL at 214. FISH 09/21/12 is pending.  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 CL TP53 for 17p13 revealed 100% with normal nuclei.  ATM for 11q22.3 revealed 81.67% with normal nuclei and 18.33% with positive nuclei for del 11q22.3  On review of systems, pt reports some lower abdominal pain, good energy levels, and denies fevers, chills, night sweats, weight loss, diarrhea, blood in the stools,  black stools, problems passing urine, and any other symptoms.   On PMHx the pt reports HTN. . On Social Hx the pt denies smoking at any point, any ETOH consumption, and illicit drug use.  Interval History:  Jordan Schroeder returns today regarding his recently diagnosed CLL/SLL and prior to C4 of BR treatment. The patient's last visit with [place] was on 01/25/18. He is accompanied today by his mother. The pt reports that he is doing well overall.   The pt reports that he has had some nausea in the mornings and evenings without taking the nausea medication. He denies feeling more nauseous before chemotherapy, denies anxiety and denies acid reflux, denies constipation. He notes that his nausea is exacerbated by feeling hot and being in the heat. He also denies his nausea keeping him from eating well.   Lab results today (02/22/18) of CBC w/diff, CMP, and Reticulocytes is as follows: all values are WNL except for PLT at 129k, Eosinophils abs at 1.0k, Glucose at 119, Total Protein at 6.2.  On review of systems, pt reports intermittent nausea, good energy levels, eating well, and denies acid reflux, constipation, anxiety, abdominal pains, leg swelling, and any other symptoms.    MEDICAL HISTORY:  Past Medical History:  Diagnosis Date  . Hypertension     SURGICAL HISTORY: History reviewed. No pertinent surgical history.  SOCIAL HISTORY: Social History   Socioeconomic History  . Marital status: Married  Spouse name: Not on file  . Number of children: Not on file  . Years of education: Not on file  . Highest education level: Not on file  Occupational History  . Not on file  Social Needs  . Financial resource strain: Not on file  . Food insecurity:    Worry: Not on file    Inability: Not on file  . Transportation needs:    Medical: Not on file    Non-medical: Not on file  Tobacco Use  . Smoking status: Never Smoker  . Smokeless tobacco: Never Used  Substance and Sexual Activity  .  Alcohol use: Not Currently  . Drug use: Never  . Sexual activity: Not on file  Lifestyle  . Physical activity:    Days per week: Not on file    Minutes per session: Not on file  . Stress: Not on file  Relationships  . Social connections:    Talks on phone: Not on file    Gets together: Not on file    Attends religious service: Not on file    Active member of club or organization: Not on file    Attends meetings of clubs or organizations: Not on file    Relationship status: Not on file  . Intimate partner violence:    Fear of current or ex partner: Not on file    Emotionally abused: Not on file    Physically abused: Not on file    Forced sexual activity: Not on file  Other Topics Concern  . Not on file  Social History Narrative  . Not on file    FAMILY HISTORY: History reviewed. No pertinent family history.  ALLERGIES:  has No Known Allergies.  MEDICATIONS:  Current Outpatient Medications  Medication Sig Dispense Refill  . acyclovir (ZOVIRAX) 400 MG tablet Take 1 tablet (400 mg total) by mouth daily. 30 tablet 3  . dexamethasone (DECADRON) 4 MG tablet Take 2 tablets (8 mg total) by mouth daily. Start the day after bendamustine chemotherapy for 2 days. Take with food. 30 tablet 1  . lisinopril (PRINIVIL,ZESTRIL) 10 MG tablet Take 10 mg by mouth daily.    . ondansetron (ZOFRAN) 8 MG tablet Take 1 tablet (8 mg total) by mouth 2 (two) times daily as needed for refractory nausea / vomiting. Start on day 2 after bendamustine chemo. 30 tablet 1  . prochlorperazine (COMPAZINE) 10 MG tablet Take 1 tablet (10 mg total) by mouth every 6 (six) hours as needed (Nausea or vomiting). 30 tablet 1  . sertraline (ZOLOFT) 100 MG tablet Take 100 mg by mouth daily.     No current facility-administered medications for this visit.     REVIEW OF SYSTEMS:    A 10+ POINT REVIEW OF SYSTEMS WAS OBTAINED including neurology, dermatology, psychiatry, cardiac, respiratory, lymph, extremities, GI, GU,  Musculoskeletal, constitutional, breasts, reproductive, HEENT.  All pertinent positives are noted in the HPI.  All others are negative.   PHYSICAL EXAMINATION: ECOG PERFORMANCE STATUS: 1 - Symptomatic but completely ambulatory  Vitals:   02/22/18 0830  BP: 115/80  Pulse: 99  Resp: 18  Temp: 98 F (36.7 C)  SpO2: 94%   Filed Weights   02/22/18 0830  Weight: 288 lb (130.6 kg)   .Body mass index is 43.79 kg/m.  GENERAL:alert, in no acute distress and comfortable SKIN: no acute rashes, no significant lesions EYES: conjunctiva are pink and non-injected, sclera anicteric OROPHARYNX: MMM, no exudates, no oropharyngeal erythema or ulceration NECK: supple, no  JVD LYMPH:  Minimal palpable supraclaicular and cervical lymphadenopathy, no palpable LNadenopathy in the , inguinal regions LUNGS: clear to auscultation b/l with normal respiratory effort HEART: regular rate & rhythm ABDOMEN:  normoactive bowel sounds , non tender, not distended. No palpable hepatosplenomegaly.  Extremity: no pedal edema PSYCH: alert & oriented x 3 with fluent speech NEURO: no focal motor/sensory deficits   LABORATORY DATA:  I have reviewed the data as listed  . CBC Latest Ref Rng & Units 02/22/2018 01/25/2018 12/28/2017  WBC 4.0 - 10.3 K/uL 4.3 4.9 5.3  Hemoglobin 13.0 - 17.1 g/dL 16.0 16.0 15.8  Hematocrit 38.4 - 49.9 % 46.6 47.3 47.0  Platelets 140 - 400 K/uL 129(L) 162 144    . CMP Latest Ref Rng & Units 02/22/2018 01/25/2018 12/28/2017  Glucose 70 - 99 mg/dL 119(H) 124(H) 122(H)  BUN 6 - 20 mg/dL '7 11 12  ' Creatinine 0.61 - 1.24 mg/dL 0.74 0.69 0.69  Sodium 135 - 145 mmol/L 141 141 140  Potassium 3.5 - 5.1 mmol/L 4.1 4.1 3.9  Chloride 98 - 111 mmol/L 107 108 107  CO2 22 - 32 mmol/L '25 25 25  ' Calcium 8.9 - 10.3 mg/dL 9.2 9.5 9.6  Total Protein 6.5 - 8.1 g/dL 6.2(L) 6.3(L) 6.6  Total Bilirubin 0.3 - 1.2 mg/dL 0.4 0.6 0.6  Alkaline Phos 38 - 126 U/L 81 81 75  AST 15 - 41 U/L '26 27 21  ' ALT 0 - 44 U/L  36 42 33   Component     Latest Ref Rng & Units 10/30/2017  HIV Screen 4th Generation wRfx     Non Reactive Non Reactive  HCV Ab     0.0 - 0.9 s/co ratio <0.1  Hepatitis B Surface Ag     Negative Negative  Hep B Core Ab, Tot     Negative Negative      10/25/17 Tissue Flow Cytometry:    10/25/17 Needle/core Bx:   [facility] Oncology  Order: [id number]  Status:  Final result Visible to patient:  No (Not Released) Next appt:  11/07/2017 at 09:00 AM in Radiology ([facility] PET) Dx:  Lymphocytosis  Component [id number]  Specimen Type Comment:   Comment: BLOOD  Cells Counted 200   Cells Analyzed 200   FISH Result Comment:   Comment: NORMAL: NO CYCLIN D1 OR IGH GENE REARRANGEMENT OBSERVED  Interpretation Comment:   Comment: (NOTE)        nuc ish 11q13(CCND1x2),14q32(IGHx2)[200]    The fluorescence in situ hybridization (FISH) result  was normal. Dual color dual fusion DNA FISH probes targeting  the cyclin D1 gene (CCND1 or BCL1)([employer] and the  heavy chain immunoglobulin gene (IgH) at 14q32 showed two  hybridization signals each with no fusion in all cells  examined. Thus, there was NO apparent rearrangement of the  primary genes involved with mantle cell lymphoma, and to a  lesser extent myeloma. .Marland Kitchen        RADIOGRAPHIC STUDIES: I have personally reviewed the radiological images as listed and agreed with the findings in the report. No results found.  ASSESSMENT & PLAN:   37y.o. male with  1. 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  -Reviewed 10/11/17 CT of C/A/P which revealed lymphadenopathy throughout the chest, abdomen and  pelvis. Most notably in the axilla and abdomen.   -Noted 13q deletion: 13q14.3 is at 47.67% for  normal nuclei, and 95.28% with mono allelic deletion of [id number].  -ATM for 11q22.3 revealed 81.67% with normal nuclei and 18.33% with positive nuclei for del 11q22.3 ---> point more to CLL/SLL   2. Rituxan infusion reaction-  -Had significant flushing with rituxan needing steroids - will hold off rapid infusion protocol.  PLAN: -Discussed pt labwork today, 02/22/18; blood counts and chemistries are stable and Lymphs are WNL at 1.1k -Will order Prilosec OTC for use before breakfast as the pt is on steroids -Will refill Acylcovir  Today  -The pt has no prohibitive toxicities from continuing C4 of BR at this time.   -Proceed with CT C/A/P in 4 days     F/u for CT as scheduled on 02/26/2018 Discontinue PET/CT scheduled for 8/27-- not approved by insurance Please schedule C5 and C6 of BR treatment as ordered with labs and MD visit Day 1 of each cycle. thx  All of the patients questions were answered with apparent satisfaction. The patient knows to call the clinic with any problems, questions or concerns.  The total time spent in the appt was 20 minutes and more than 50% was on counseling and direct patient cares.     Sullivan Lone MD MS AAHIVMS [facility] Hematology/Oncology Physician [facility]  (Office):       [phone] (Work cell):  [phone] (Fax):           [phone]  02/22/2018 9:23 AM   I, Baldwin Jamaica, am acting as a scribe for Dr. Irene Limbo  .I have reviewed the above documentation for accuracy and completeness, and I agree with the above. Brunetta Genera MD

## 2018-02-22 ENCOUNTER — Inpatient Hospital Stay: Payer: Managed Care, Other (non HMO) | Attending: Hematology

## 2018-02-22 ENCOUNTER — Other Ambulatory Visit: Payer: Managed Care, Other (non HMO)

## 2018-02-22 ENCOUNTER — Telehealth: Payer: Self-pay | Admitting: Hematology

## 2018-02-22 ENCOUNTER — Inpatient Hospital Stay: Payer: Managed Care, Other (non HMO)

## 2018-02-22 ENCOUNTER — Inpatient Hospital Stay (HOSPITAL_BASED_OUTPATIENT_CLINIC_OR_DEPARTMENT_OTHER): Payer: Managed Care, Other (non HMO) | Admitting: Hematology

## 2018-02-22 ENCOUNTER — Encounter: Payer: Self-pay | Admitting: Hematology

## 2018-02-22 VITALS — BP 100/68 | HR 84 | Temp 98.0°F | Resp 18

## 2018-02-22 VITALS — BP 115/80 | HR 99 | Temp 98.0°F | Resp 18 | Ht 68.0 in | Wt 288.0 lb

## 2018-02-22 DIAGNOSIS — Z79899 Other long term (current) drug therapy: Secondary | ICD-10-CM

## 2018-02-22 DIAGNOSIS — Z5112 Encounter for antineoplastic immunotherapy: Secondary | ICD-10-CM | POA: Insufficient documentation

## 2018-02-22 DIAGNOSIS — C8308 Small cell B-cell lymphoma, lymph nodes of multiple sites: Secondary | ICD-10-CM | POA: Insufficient documentation

## 2018-02-22 DIAGNOSIS — I10 Essential (primary) hypertension: Secondary | ICD-10-CM | POA: Diagnosis not present

## 2018-02-22 DIAGNOSIS — R110 Nausea: Secondary | ICD-10-CM | POA: Insufficient documentation

## 2018-02-22 DIAGNOSIS — Z7189 Other specified counseling: Secondary | ICD-10-CM

## 2018-02-22 DIAGNOSIS — R109 Unspecified abdominal pain: Secondary | ICD-10-CM | POA: Insufficient documentation

## 2018-02-22 DIAGNOSIS — Z5111 Encounter for antineoplastic chemotherapy: Secondary | ICD-10-CM | POA: Insufficient documentation

## 2018-02-22 DIAGNOSIS — D696 Thrombocytopenia, unspecified: Secondary | ICD-10-CM

## 2018-02-22 LAB — CMP (CANCER CENTER ONLY)
ALBUMIN: 3.8 g/dL (ref 3.5–5.0)
ALK PHOS: 81 U/L (ref 38–126)
ALT: 36 U/L (ref 0–44)
AST: 26 U/L (ref 15–41)
Anion gap: 9 (ref 5–15)
BUN: 7 mg/dL (ref 6–20)
CALCIUM: 9.2 mg/dL (ref 8.9–10.3)
CO2: 25 mmol/L (ref 22–32)
CREATININE: 0.74 mg/dL (ref 0.61–1.24)
Chloride: 107 mmol/L (ref 98–111)
GFR, Est AFR Am: >60 mL/min (ref >60)
GFR, Estimated: >60 mL/min (ref >60)
GLUCOSE: 119 mg/dL — AB (ref 70–99)
Potassium: 4.1 mmol/L (ref 3.5–5.1)
SODIUM: 141 mmol/L (ref 135–145)
Total Bilirubin: 0.4 mg/dL (ref 0.3–1.2)
Total Protein: 6.2 g/dL — ABNORMAL LOW (ref 6.5–8.1)

## 2018-02-22 LAB — CBC WITH DIFFERENTIAL/PLATELET
BASOS ABS: 0.1 10*3/uL (ref 0.0–0.1)
BASOS PCT: 3 %
EOS ABS: 1 10*3/uL — AB (ref 0.0–0.5)
Eosinophils Relative: 22 %
HCT: 46.6 % (ref 38.4–49.9)
HEMOGLOBIN: 16 g/dL (ref 13.0–17.1)
Lymphocytes Relative: 25 %
Lymphs Abs: 1.1 10*3/uL (ref 0.9–3.3)
MCH: 31 pg (ref 27.2–33.4)
MCHC: 34.3 g/dL (ref 32.0–36.0)
MCV: 90.3 fL (ref 79.3–98.0)
Monocytes Absolute: 0.5 10*3/uL (ref 0.1–0.9)
Monocytes Relative: 12 %
NEUTROS PCT: 38 %
Neutro Abs: 1.6 10*3/uL (ref 1.5–6.5)
Platelets: 129 10*3/uL — ABNORMAL LOW (ref 140–400)
RBC: 5.16 MIL/uL (ref 4.20–5.82)
RDW: 13.2 % (ref 11.0–14.6)
WBC: 4.3 10*3/uL (ref 4.0–10.3)

## 2018-02-22 MED ORDER — DEXAMETHASONE SODIUM PHOSPHATE 10 MG/ML IJ SOLN
INTRAMUSCULAR | Status: AC
  Filled 2018-02-22: qty 1

## 2018-02-22 MED ORDER — DIPHENHYDRAMINE HCL 25 MG PO CAPS
50.0000 mg | ORAL_CAPSULE | Freq: Once | ORAL | Status: AC
  Administered 2018-02-22: 50 mg via ORAL

## 2018-02-22 MED ORDER — SODIUM CHLORIDE 0.9 % IV SOLN
Freq: Once | INTRAVENOUS | Status: AC
  Administered 2018-02-22: 10:00:00 via INTRAVENOUS
  Filled 2018-02-22: qty 250

## 2018-02-22 MED ORDER — DIPHENHYDRAMINE HCL 25 MG PO CAPS
ORAL_CAPSULE | ORAL | Status: AC
  Filled 2018-02-22: qty 2

## 2018-02-22 MED ORDER — DEXAMETHASONE SODIUM PHOSPHATE 10 MG/ML IJ SOLN
10.0000 mg | Freq: Once | INTRAMUSCULAR | Status: AC
  Administered 2018-02-22: 10 mg via INTRAVENOUS

## 2018-02-22 MED ORDER — ACETAMINOPHEN 325 MG PO TABS
650.0000 mg | ORAL_TABLET | Freq: Once | ORAL | Status: AC
  Administered 2018-02-22: 650 mg via ORAL

## 2018-02-22 MED ORDER — PALONOSETRON HCL INJECTION 0.25 MG/5ML
INTRAVENOUS | Status: AC
  Filled 2018-02-22: qty 5

## 2018-02-22 MED ORDER — ACETAMINOPHEN 325 MG PO TABS
ORAL_TABLET | ORAL | Status: AC
  Filled 2018-02-22: qty 2

## 2018-02-22 MED ORDER — SODIUM CHLORIDE 0.9 % IV SOLN
375.0000 mg/m2 | Freq: Once | INTRAVENOUS | Status: AC
  Administered 2018-02-22: 900 mg via INTRAVENOUS
  Filled 2018-02-22: qty 40

## 2018-02-22 MED ORDER — PALONOSETRON HCL INJECTION 0.25 MG/5ML
0.2500 mg | Freq: Once | INTRAVENOUS | Status: AC
  Administered 2018-02-22: 0.25 mg via INTRAVENOUS

## 2018-02-22 MED ORDER — SODIUM CHLORIDE 0.9 % IV SOLN
90.0000 mg/m2 | Freq: Once | INTRAVENOUS | Status: AC
  Administered 2018-02-22: 225 mg via INTRAVENOUS
  Filled 2018-02-22: qty 9

## 2018-02-22 NOTE — Telephone Encounter (Signed)
Appts scheduled contrast material/ PET/CT for 8/27 has been cancelled/ avs/calendar printed per 8/22 los

## 2018-02-22 NOTE — Patient Instructions (Signed)
[facility] Discharge Instructions for Patients Receiving Chemotherapy  Today you received the following chemotherapy agents: Rituxan, Bendeka   To help prevent nausea and vomiting after your treatment, we encourage you to take your nausea medication as directed.    If you develop nausea and vomiting that is not controlled by your nausea medication, call the clinic.   BELOW ARE SYMPTOMS THAT SHOULD BE REPORTED IMMEDIATELY:  *FEVER GREATER THAN 100.5 F  *CHILLS WITH OR WITHOUT FEVER  NAUSEA AND VOMITING THAT IS NOT CONTROLLED WITH YOUR NAUSEA MEDICATION  *UNUSUAL SHORTNESS OF BREATH  *UNUSUAL BRUISING OR BLEEDING  TENDERNESS IN MOUTH AND THROAT WITH OR WITHOUT PRESENCE OF ULCERS  *URINARY PROBLEMS  *BOWEL PROBLEMS  UNUSUAL RASH Items with * indicate a potential emergency and should be followed up as soon as possible.  Feel free to call the clinic should you have any questions or concerns. The clinic phone number is [phone].  Please show the CHEMO ALERT CARD at check-in to the Emergency Department and triage nurse.   

## 2018-02-23 ENCOUNTER — Inpatient Hospital Stay: Payer: Managed Care, Other (non HMO)

## 2018-02-23 VITALS — BP 126/71 | HR 96 | Temp 98.1°F | Resp 18

## 2018-02-23 DIAGNOSIS — C8308 Small cell B-cell lymphoma, lymph nodes of multiple sites: Secondary | ICD-10-CM

## 2018-02-23 DIAGNOSIS — Z7189 Other specified counseling: Secondary | ICD-10-CM

## 2018-02-23 MED ORDER — SODIUM CHLORIDE 0.9 % IV SOLN
90.0000 mg/m2 | Freq: Once | INTRAVENOUS | Status: AC
  Administered 2018-02-23: 225 mg via INTRAVENOUS
  Filled 2018-02-23: qty 9

## 2018-02-23 MED ORDER — SODIUM CHLORIDE 0.9 % IV SOLN
Freq: Once | INTRAVENOUS | Status: AC
  Administered 2018-02-23: 11:00:00 via INTRAVENOUS
  Filled 2018-02-23: qty 250

## 2018-02-23 MED ORDER — DEXAMETHASONE SODIUM PHOSPHATE 10 MG/ML IJ SOLN
INTRAMUSCULAR | Status: AC
  Filled 2018-02-23: qty 1

## 2018-02-23 MED ORDER — DEXAMETHASONE SODIUM PHOSPHATE 10 MG/ML IJ SOLN
10.0000 mg | Freq: Once | INTRAMUSCULAR | Status: AC
  Administered 2018-02-23: 10 mg via INTRAVENOUS

## 2018-02-23 NOTE — Patient Instructions (Signed)
[facility] Discharge Instructions for Patients Receiving Chemotherapy  Today you received the following chemotherapy agents Bendeka.   To help prevent nausea and vomiting after your treatment, we encourage you to take your nausea medication as prescribed.    If you develop nausea and vomiting that is not controlled by your nausea medication, call the clinic.   BELOW ARE SYMPTOMS THAT SHOULD BE REPORTED IMMEDIATELY:  *FEVER GREATER THAN 100.5 F  *CHILLS WITH OR WITHOUT FEVER  NAUSEA AND VOMITING THAT IS NOT CONTROLLED WITH YOUR NAUSEA MEDICATION  *UNUSUAL SHORTNESS OF BREATH  *UNUSUAL BRUISING OR BLEEDING  TENDERNESS IN MOUTH AND THROAT WITH OR WITHOUT PRESENCE OF ULCERS  *URINARY PROBLEMS  *BOWEL PROBLEMS  UNUSUAL RASH Items with * indicate a potential emergency and should be followed up as soon as possible.  Feel free to call the clinic should you have any questions or concerns. The clinic phone number is [phone].  Please show the CHEMO ALERT CARD at check-in to the Emergency Department and triage nurse.  

## 2018-02-26 ENCOUNTER — Ambulatory Visit (HOSPITAL_COMMUNITY)
Admission: RE | Admit: 2018-02-26 | Discharge: 2018-02-26 | Disposition: A | Discharge status: Home / Self Care | Payer: Managed Care, Other (non HMO) | Source: Ambulatory Visit | Attending: Hematology | Admitting: Hematology

## 2018-02-26 DIAGNOSIS — K760 Fatty (change of) liver, not elsewhere classified: Secondary | ICD-10-CM | POA: Insufficient documentation

## 2018-02-26 DIAGNOSIS — C8308 Small cell B-cell lymphoma, lymph nodes of multiple sites: Secondary | ICD-10-CM | POA: Insufficient documentation

## 2018-02-26 MED ORDER — IOHEXOL 300 MG/ML  SOLN
100.0000 mL | Freq: Once | INTRAMUSCULAR | Status: AC | PRN
  Administered 2018-02-26: 100 mL via INTRAVENOUS

## 2018-02-27 ENCOUNTER — Ambulatory Visit (HOSPITAL_COMMUNITY): Payer: Managed Care, Other (non HMO)

## 2018-03-08 ENCOUNTER — Telehealth: Payer: Self-pay | Admitting: Hematology

## 2018-03-08 NOTE — Telephone Encounter (Signed)
Printed medical records for [place], Release ID: [id number]

## 2018-03-13 NOTE — Progress Notes (Signed)
FMLA successfully faxed to [employer] at [phone]. Mailed copy to patient address on file. 

## 2018-03-21 NOTE — Progress Notes (Signed)
HEMATOLOGY/ONCOLOGY CLINIC NOTE  Date of Service: 03/22/18  Patient Care Team: Jordan Bien, MD as PCP - General (Family Medicine)  CHIEF COMPLAINTS/PURPOSE OF CONSULTATION:  F/u for mx of CLL/SLL  HISTORY OF PRESENTING ILLNESS:   Jordan Schroeder is a wonderful 37 y.o. 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 He is accompanied today by his mother. The pt reports that he is doing well overall.   The pt reports that he has not had any significant medical problems in the past. He notes that on an annual physical, his PCP noticed his WBC was high, and after repeat blood tests, he was referred to Dr. Nicholas Schroeder. Dr. Lindi Schroeder has already begun the initial work ups as noted below. The pt notes that he does not notice any change in his energy levels or feeling any different over the last 6 months.   He notes that he has some concerns for sleep apnea as he snores a lot at night but does not wake up due to SOB.   Of note prior to the patient's visit today, pt has had CT C/A/P completed on 10/11/17 with results revealing Lymphadenopathy throughout the chest, abdomen, and pelvis.   On 09/21/17 the pt also had a Flow Cytometry revealing MONOCLONAL B-CELL POPULATION IDENTIFIED.  Most recent lab results (09/21/17) of CBC  is as follows: all values are WNL except for WBC at 23.4k, Lymphs Abs at 15.9k, Monocytes Abs at 1.1k. LDH 09/21/17 was WNL at 214. FISH 09/21/12 is pending.  [facility] 10/05/17 revealed CEP 12 at 100%,  13q14.3 is at 47.67% for normal nuclei, and 04.54% with mono allelic deletion of [id number]. CL TP53 for 17p13 revealed 100% with normal nuclei.  ATM for 11q22.3 revealed 81.67% with normal nuclei and 18.33% with positive nuclei for del 11q22.3  On review of systems, pt reports some lower abdominal pain, good energy levels, and denies fevers, chills, night sweats, weight loss, diarrhea, blood in the stools,  black stools, problems passing urine, and any other symptoms.   On PMHx the pt reports HTN. . On Social Hx the pt denies smoking at any point, any ETOH consumption, and illicit drug use.  Interval History:  Jordan Schroeder returns today regarding his recently diagnosed CLL/SLL and prior to C5 of BR treatment. The patient's last visit with [place] was on 02/22/18. The pt reports that he is doing well overall.   The pt reports that he continues to tolerate BR treatment very well. He denies any new concerns in the interim. The pt notes that he has had good energy levels but has had some intermittent nausea which is addressed with his supportive medications.   Of note since the patient's last visit, pt has had CT C/A/P completed on 02/26/18 with results revealing Marked response to therapy as evidenced by decrease in size or resolution of adenopathy throughout chest, abdomen and pelvis. 2. Hepatic steatosis. Slight marginal irregularity suggests cirrhosis. 3. Enlarged pulmonary arteries, indicative of pulmonary arterial hypertension.  Lab results today (03/22/18) of CBC w/diff, CMP is as follows: all values are WNL except for WBC at 3.8k, Lymphs abs at 600, Glucose at 111.  On review of systems, pt reports good energy levels, intermittent nausea, and denies skin rashes, fevers, chills, night sweats, concerns for infections, vomiting, diarrhea, abdominal pain, leg swelling, testicular pain or swelling, SOB, CP, and any other symptoms.    MEDICAL HISTORY:  Past Medical History:  Diagnosis Date  . Hypertension     SURGICAL HISTORY: No past surgical history on file.  SOCIAL HISTORY: Social History   Socioeconomic History  . Marital status: Married    Spouse name: Not on file  . Number of children: Not on file  . Years of education: Not on file  . Highest education level: Not on file  Occupational History  . Not on file  Social Needs  . Financial resource strain: Not on file  . Food insecurity:      Worry: Not on file    Inability: Not on file  . Transportation needs:    Medical: Not on file    Non-medical: Not on file  Tobacco Use  . Smoking status: Never Smoker  . Smokeless tobacco: Never Used  Substance and Sexual Activity  . Alcohol use: Not Currently  . Drug use: Never  . Sexual activity: Not on file  Lifestyle  . Physical activity:    Days per week: Not on file    Minutes per session: Not on file  . Stress: Not on file  Relationships  . Social connections:    Talks on phone: Not on file    Gets together: Not on file    Attends religious service: Not on file    Active member of club or organization: Not on file    Attends meetings of clubs or organizations: Not on file    Relationship status: Not on file  . Intimate partner violence:    Fear of current or ex partner: Not on file    Emotionally abused: Not on file    Physically abused: Not on file    Forced sexual activity: Not on file  Other Topics Concern  . Not on file  Social History Narrative  . Not on file    FAMILY HISTORY: No family history on file.  ALLERGIES:  has No Known Allergies.  MEDICATIONS:  Current Outpatient Medications  Medication Sig Dispense Refill  . acyclovir (ZOVIRAX) 400 MG tablet Take 1 tablet (400 mg total) by mouth daily. 30 tablet 3  . dexamethasone (DECADRON) 4 MG tablet Take 2 tablets (8 mg total) by mouth daily. Start the day after bendamustine chemotherapy for 2 days. Take with food. 30 tablet 1  . lisinopril (PRINIVIL,ZESTRIL) 10 MG tablet Take 10 mg by mouth daily.    . ondansetron (ZOFRAN) 8 MG tablet Take 1 tablet (8 mg total) by mouth 2 (two) times daily as needed for refractory nausea / vomiting. Start on day 2 after bendamustine chemo. 30 tablet 1  . prochlorperazine (COMPAZINE) 10 MG tablet Take 1 tablet (10 mg total) by mouth every 6 (six) hours as needed (Nausea or vomiting). 30 tablet 1  . sertraline (ZOLOFT) 100 MG tablet Take 100 mg by mouth daily.     No  current facility-administered medications for this visit.     REVIEW OF SYSTEMS:    A 10+ POINT REVIEW OF SYSTEMS WAS OBTAINED including neurology, dermatology, psychiatry, cardiac, respiratory, lymph, extremities, GI, GU, Musculoskeletal, constitutional, breasts, reproductive, HEENT.  All pertinent positives are noted in the HPI.  All others are negative.   PHYSICAL EXAMINATION: ECOG PERFORMANCE STATUS: 1 - Symptomatic but completely ambulatory  Vitals:   03/22/18 0936  BP: 106/85  Pulse: (!) 101  Resp: 18  Temp: 98.1 F (36.7 C)  SpO2: 95%   Filed Weights   03/22/18 0936  Weight: 285 lb 8 oz (129.5 kg)   .  Body mass index is 43.41 kg/m.   GENERAL:alert, in no acute distress and comfortable SKIN: no acute rashes, no significant lesions EYES: conjunctiva are pink and non-injected, sclera anicteric OROPHARYNX: MMM, no exudates, no oropharyngeal erythema or ulceration NECK: supple, no JVD LYMPH:  Minimal palpable supraclavicular and cervical lymphadenopathy, no palpable lymphadenopathy in the inguinal regions LUNGS: clear to auscultation b/l with normal respiratory effort HEART: regular rate & rhythm ABDOMEN:  normoactive bowel sounds , non tender, not distended. No palpable hepatosplenomegaly.  Extremity: no pedal edema PSYCH: alert & oriented x 3 with fluent speech NEURO: no focal motor/sensory deficits   LABORATORY DATA:  I have reviewed the data as listed  . CBC Latest Ref Rng & Units 03/22/2018 02/22/2018 01/25/2018  WBC 4.0 - 10.3 K/uL 3.8(L) 4.3 4.9  Hemoglobin 13.0 - 17.1 g/dL 16.6 16.0 16.0  Hematocrit 38.4 - 49.9 % 48.2 46.6 47.3  Platelets 140 - 400 K/uL 149 129(L) 162    . CMP Latest Ref Rng & Units 03/22/2018 02/22/2018 01/25/2018  Glucose 70 - 99 mg/dL 111(H) 119(H) 124(H)  BUN 6 - 20 mg/dL _0 Creatinine 0.61 - 1.24 mg/dL 0.73 0.74 0.69  Sodium 135 - 145 mmol/L 141 141 141  Potassium 3.5 - 5.1 mmol/L 4.2 4.1 4.1  Chloride 98 - 111 mmol/L 108 107 108    CO2 22 - 32 mmol/L _1 Calcium 8.9 - 10.3 mg/dL 9.5 9.2 9.5  Total Protein 6.5 - 8.1 g/dL 6.8 6.2(L) 6.3(L)  Total Bilirubin 0.3 - 1.2 mg/dL 0.7 0.4 0.6  Alkaline Phos 38 - 126 U/L 74 81 81  AST 15 - 41 U/L 34 26 27  ALT 0 - 44 U/L 41 36 42   Component     Latest Ref Rng & Units 10/30/2017  HIV Screen 4th Generation wRfx     Non Reactive Non Reactive  HCV Ab     0.0 - 0.9 s/co ratio <0.1  Hepatitis B Surface Ag     Negative Negative  Hep B Core Ab, Tot     Negative Negative      10/25/17 Tissue Flow Cytometry:    10/25/17 Needle/core Bx:   FISH Oncology  Order: [id number]  Status:  Final result Visible to patient:  No (Not Released) Next appt:  11/07/2017 at 09:00 AM in Radiology ([facility] PET) Dx:  Lymphocytosis  Component [id number]  Specimen Type Comment:   Comment: BLOOD  Cells Counted 200   Cells Analyzed 200   FISH Result Comment:   Comment: NORMAL: NO CYCLIN D1 OR IGH GENE REARRANGEMENT OBSERVED  Interpretation Comment:   Comment: (NOTE)        nuc ish 11q13(CCND1x2),14q32(IGHx2)[200]    The fluorescence in situ hybridization (FISH) result  was normal. Dual color dual fusion DNA FISH probes targeting  the cyclin D1 gene (CCND1 or BCL1)([employer] and the  heavy chain immunoglobulin gene (IgH) at 14q32 showed two  hybridization signals each with no fusion in all cells  examined. Thus, there was NO apparent rearrangement of the  primary genes involved with mantle cell lymphoma, and to a  lesser extent myeloma. .Marland Kitchen        RADIOGRAPHIC STUDIES: I have personally reviewed the radiological images as listed and agreed with the findings in the report. Ct Chest W Contrast  Result Date: 02/26/2018 CLINICAL DATA:  Restaging lymphoma, on chemotherapy and immunotherapy. EXAM: CT CHEST, ABDOMEN, AND PELVIS WITH CONTRAST TECHNIQUE: Multidetector CT imaging of the  chest, abdomen and pelvis was performed following the standard protocol during bolus  administration of intravenous contrast. CONTRAST:  128m OMNIPAQUE IOHEXOL 300 MG/ML  SOLN COMPARISON:  PET 11/07/2017 and CT chest abdomen pelvis 10/11/2017. FINDINGS: CT CHEST FINDINGS Cardiovascular: Pulmonary arteries and heart are enlarged. No pericardial effusion. Mediastinum/Nodes: Mediastinal lymph nodes are not enlarged by CT size criteria. No hilar or axillary adenopathy. Esophagus is grossly unremarkable. Lungs/Pleura: Increased density in the lungs is consistent with expiratory phase imaging. Lungs are otherwise clear. No pleural fluid. Airway is unremarkable. Musculoskeletal: No worrisome lytic or sclerotic lesions. CT ABDOMEN PELVIS FINDINGS Hepatobiliary: Liver is decreased in attenuation diffusely. Liver margin is slightly irregular. Liver and gallbladder are otherwise unremarkable. No biliary ductal dilatation. Pancreas: Negative. Spleen: Negative. Adrenals/Urinary Tract: Adrenal glands and kidneys are unremarkable. Ureters are decompressed. Bladder is grossly unremarkable. Stomach/Bowel: Stomach, small bowel, appendix and colon are unremarkable. Vascular/Lymphatic: Vascular structures are unremarkable. Marked reduction in abdominal and pelvic adenopathy. Gastroduodenal ligament lymph node measures 4.4 x 6.8 cm (series 2, image 60), previously 8.9 x 11.8 cm. Abdominal retroperitoneal lymph nodes measure up to 9 mm anterior to the aorta (image 66), previously 1.6 cm. Small bowel mesenteric lymph nodes measure up to 6 mm in the ileocolic mesentery, previously 2.1 cm. Pelvic retroperitoneal adenopathy is seen along the iliac chains bilaterally. Index left external iliac lymph node measures 13 mm in short axis (image 114), previously 3.5 cm. No residual inguinal adenopathy. Reproductive: Prostate is visualized. Other: No free fluid. Mesenteries and peritoneum are otherwise unremarkable. Musculoskeletal: No worrisome lytic or sclerotic lesions. IMPRESSION: 1. Marked response to therapy as evidenced by  decrease in size or resolution of adenopathy throughout chest, abdomen and pelvis. 2. Hepatic steatosis. Slight marginal irregularity suggests cirrhosis. 3. Enlarged pulmonary arteries, indicative of pulmonary arterial hypertension. Electronically Signed   By: MLorin PicketM.D.   On: 02/26/2018 09:51   Ct Abdomen Pelvis W Contrast  Result Date: 02/26/2018 CLINICAL DATA:  Restaging lymphoma, on chemotherapy and immunotherapy. EXAM: CT CHEST, ABDOMEN, AND PELVIS WITH CONTRAST TECHNIQUE: Multidetector CT imaging of the chest, abdomen and pelvis was performed following the standard protocol during bolus administration of intravenous contrast. CONTRAST:  1069mOMNIPAQUE IOHEXOL 300 MG/ML  SOLN COMPARISON:  PET 11/07/2017 and CT chest abdomen pelvis 10/11/2017. FINDINGS: CT CHEST FINDINGS Cardiovascular: Pulmonary arteries and heart are enlarged. No pericardial effusion. Mediastinum/Nodes: Mediastinal lymph nodes are not enlarged by CT size criteria. No hilar or axillary adenopathy. Esophagus is grossly unremarkable. Lungs/Pleura: Increased density in the lungs is consistent with expiratory phase imaging. Lungs are otherwise clear. No pleural fluid. Airway is unremarkable. Musculoskeletal: No worrisome lytic or sclerotic lesions. CT ABDOMEN PELVIS FINDINGS Hepatobiliary: Liver is decreased in attenuation diffusely. Liver margin is slightly irregular. Liver and gallbladder are otherwise unremarkable. No biliary ductal dilatation. Pancreas: Negative. Spleen: Negative. Adrenals/Urinary Tract: Adrenal glands and kidneys are unremarkable. Ureters are decompressed. Bladder is grossly unremarkable. Stomach/Bowel: Stomach, small bowel, appendix and colon are unremarkable. Vascular/Lymphatic: Vascular structures are unremarkable. Marked reduction in abdominal and pelvic adenopathy. Gastroduodenal ligament lymph node measures 4.4 x 6.8 cm (series 2, image 60), previously 8.9 x 11.8 cm. Abdominal retroperitoneal lymph nodes  measure up to 9 mm anterior to the aorta (image 66), previously 1.6 cm. Small bowel mesenteric lymph nodes measure up to 6 mm in the ileocolic mesentery, previously 2.1 cm. Pelvic retroperitoneal adenopathy is seen along the iliac chains bilaterally. Index left external iliac lymph node measures 13 mm in short axis (image 114), previously  3.5 cm. No residual inguinal adenopathy. Reproductive: Prostate is visualized. Other: No free fluid. Mesenteries and peritoneum are otherwise unremarkable. Musculoskeletal: No worrisome lytic or sclerotic lesions. IMPRESSION: 1. Marked response to therapy as evidenced by decrease in size or resolution of adenopathy throughout chest, abdomen and pelvis. 2. Hepatic steatosis. Slight marginal irregularity suggests cirrhosis. 3. Enlarged pulmonary arteries, indicative of pulmonary arterial hypertension. Electronically Signed   By: Lorin Picket M.D.   On: 02/26/2018 09:51    ASSESSMENT & PLAN:   37 y.o. male with  1. 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  -Reviewed 10/11/17 CT of C/A/P which revealed lymphadenopathy throughout the chest, abdomen and pelvis. Most notably in the axilla and abdomen.   -Noted 13q deletion: 13q14.3 is at 47.67% for normal nuclei, and 16.10% with mono allelic deletion of [id number].  -ATM for 11q22.3 revealed 81.67% with normal nuclei and 18.33% with positive nuclei for del 11q22.3 ---> point more to CLL/SLL   2. Rituxan infusion reaction-  -Had significant flushing with rituxan needing steroids - will hold off rapid infusion protocol.  PLAN: -Discussed pt labwork today, 03/22/18; blood counts and chemistries are stable -Discussed the 02/26/18 CT C/A/P which revealed  Marked response to therapy as  evidenced by decrease in size or resolution of adenopathy throughout chest, abdomen and pelvis. 2. Hepatic steatosis. Slight marginal irregularity suggests cirrhosis. 3. Enlarged pulmonary arteries, indicative of pulmonary arterial hypertension. -Advised that the pt be evaluated for sleep apnea, as he snores a lot but has enlarged pulmonary arteries -Recommend PCP evaluate sleep apnea further  -Will refill Acyclovir today  -The pt has no prohibitive toxicities from continuing C5 BR at this time.   -Will see the pt back in 4 weeks    3. Likely sleep apnea -Advised that the pt be evaluated for sleep apnea, as he snores a lot but has enlarged pulmonary arteries -Recommend PCP evaluate sleep apnea further    Please schedule 6th cycle of BR as per orders in 4 weeks (2days) with labs and MD visit D1   All of the patients questions were answered with apparent satisfaction. The patient knows to call the clinic with any problems, questions or concerns.  The total time spent in the appt was 20 minutes and more than 50% was on counseling and direct patient cares.    Sullivan Lone MD MS AAHIVMS [facility] Hematology/Oncology Physician [facility]  (Office):       [phone] (Work cell):  [phone] (Fax):           [phone]  03/22/2018 10:05 AM   I, Baldwin Jamaica, am acting as a scribe for Dr. Irene Limbo  .I have reviewed the above documentation for accuracy and completeness, and I agree with the above. Brunetta Genera MD

## 2018-03-22 ENCOUNTER — Inpatient Hospital Stay (HOSPITAL_BASED_OUTPATIENT_CLINIC_OR_DEPARTMENT_OTHER): Payer: Managed Care, Other (non HMO) | Admitting: Hematology

## 2018-03-22 ENCOUNTER — Telehealth: Payer: Self-pay | Admitting: Hematology

## 2018-03-22 ENCOUNTER — Inpatient Hospital Stay: Payer: Managed Care, Other (non HMO)

## 2018-03-22 ENCOUNTER — Inpatient Hospital Stay: Payer: Managed Care, Other (non HMO) | Attending: Hematology

## 2018-03-22 VITALS — BP 106/85 | HR 101 | Temp 98.1°F | Resp 18 | Ht 68.0 in | Wt 285.5 lb

## 2018-03-22 VITALS — BP 119/66 | HR 94 | Temp 98.0°F | Resp 18

## 2018-03-22 DIAGNOSIS — K760 Fatty (change of) liver, not elsewhere classified: Secondary | ICD-10-CM

## 2018-03-22 DIAGNOSIS — Z79899 Other long term (current) drug therapy: Secondary | ICD-10-CM | POA: Insufficient documentation

## 2018-03-22 DIAGNOSIS — C8308 Small cell B-cell lymphoma, lymph nodes of multiple sites: Secondary | ICD-10-CM

## 2018-03-22 DIAGNOSIS — I10 Essential (primary) hypertension: Secondary | ICD-10-CM | POA: Insufficient documentation

## 2018-03-22 DIAGNOSIS — Z5111 Encounter for antineoplastic chemotherapy: Secondary | ICD-10-CM

## 2018-03-22 DIAGNOSIS — Z7189 Other specified counseling: Secondary | ICD-10-CM

## 2018-03-22 DIAGNOSIS — D696 Thrombocytopenia, unspecified: Secondary | ICD-10-CM

## 2018-03-22 LAB — CBC WITH DIFFERENTIAL/PLATELET
Basophils Absolute: 0 10*3/uL (ref 0.0–0.1)
Basophils Relative: 1 %
EOS ABS: 0.4 10*3/uL (ref 0.0–0.5)
Eosinophils Relative: 11 %
HEMATOCRIT: 48.2 % (ref 38.4–49.9)
Hemoglobin: 16.6 g/dL (ref 13.0–17.1)
Lymphocytes Relative: 15 %
Lymphs Abs: 0.6 10*3/uL — ABNORMAL LOW (ref 0.9–3.3)
MCH: 30.8 pg (ref 27.2–33.4)
MCHC: 34.5 g/dL (ref 32.0–36.0)
MCV: 89.1 fL (ref 79.3–98.0)
MONO ABS: 0.9 10*3/uL (ref 0.1–0.9)
Monocytes Relative: 23 %
NEUTROS ABS: 1.9 10*3/uL (ref 1.5–6.5)
NEUTROS PCT: 50 %
Platelets: 149 10*3/uL (ref 140–400)
RBC: 5.41 MIL/uL (ref 4.20–5.82)
RDW: 13.7 % (ref 11.0–14.6)
WBC: 3.8 10*3/uL — AB (ref 4.0–10.3)

## 2018-03-22 LAB — CMP (CANCER CENTER ONLY)
ALT: 41 U/L (ref 0–44)
ANION GAP: 10 (ref 5–15)
AST: 34 U/L (ref 15–41)
Albumin: 4.1 g/dL (ref 3.5–5.0)
Alkaline Phosphatase: 74 U/L (ref 38–126)
BILIRUBIN TOTAL: 0.7 mg/dL (ref 0.3–1.2)
BUN: 8 mg/dL (ref 6–20)
CO2: 23 mmol/L (ref 22–32)
Calcium: 9.5 mg/dL (ref 8.9–10.3)
Chloride: 108 mmol/L (ref 98–111)
Creatinine: 0.73 mg/dL (ref 0.61–1.24)
GFR, Est AFR Am: >60 mL/min (ref >60)
Glucose, Bld: 111 mg/dL — ABNORMAL HIGH (ref 70–99)
POTASSIUM: 4.2 mmol/L (ref 3.5–5.1)
Sodium: 141 mmol/L (ref 135–145)
TOTAL PROTEIN: 6.8 g/dL (ref 6.5–8.1)

## 2018-03-22 MED ORDER — PALONOSETRON HCL INJECTION 0.25 MG/5ML
INTRAVENOUS | Status: AC
  Filled 2018-03-22: qty 5

## 2018-03-22 MED ORDER — DEXAMETHASONE SODIUM PHOSPHATE 10 MG/ML IJ SOLN
10.0000 mg | Freq: Once | INTRAMUSCULAR | Status: AC
  Administered 2018-03-22: 10 mg via INTRAVENOUS

## 2018-03-22 MED ORDER — DIPHENHYDRAMINE HCL 25 MG PO CAPS
ORAL_CAPSULE | ORAL | Status: AC
  Filled 2018-03-22: qty 2

## 2018-03-22 MED ORDER — ACETAMINOPHEN 325 MG PO TABS
ORAL_TABLET | ORAL | Status: AC
  Filled 2018-03-22: qty 2

## 2018-03-22 MED ORDER — PALONOSETRON HCL INJECTION 0.25 MG/5ML
0.2500 mg | Freq: Once | INTRAVENOUS | Status: AC
  Administered 2018-03-22: 0.25 mg via INTRAVENOUS

## 2018-03-22 MED ORDER — SODIUM CHLORIDE 0.9 % IV SOLN
375.0000 mg/m2 | Freq: Once | INTRAVENOUS | Status: AC
  Administered 2018-03-22: 900 mg via INTRAVENOUS
  Filled 2018-03-22: qty 90

## 2018-03-22 MED ORDER — SODIUM CHLORIDE 0.9 % IV SOLN
90.0000 mg/m2 | Freq: Once | INTRAVENOUS | Status: AC
  Administered 2018-03-22: 225 mg via INTRAVENOUS
  Filled 2018-03-22: qty 9

## 2018-03-22 MED ORDER — DEXAMETHASONE SODIUM PHOSPHATE 10 MG/ML IJ SOLN
INTRAMUSCULAR | Status: AC
  Filled 2018-03-22: qty 1

## 2018-03-22 MED ORDER — SODIUM CHLORIDE 0.9 % IV SOLN
Freq: Once | INTRAVENOUS | Status: AC
  Administered 2018-03-22: 11:00:00 via INTRAVENOUS
  Filled 2018-03-22: qty 250

## 2018-03-22 MED ORDER — ACETAMINOPHEN 325 MG PO TABS
650.0000 mg | ORAL_TABLET | Freq: Once | ORAL | Status: AC
Start: 2018-03-22 — End: 2018-03-22
  Administered 2018-03-22: 650 mg via ORAL

## 2018-03-22 MED ORDER — DIPHENHYDRAMINE HCL 25 MG PO CAPS
50.0000 mg | ORAL_CAPSULE | Freq: Once | ORAL | Status: AC
Start: 2018-03-22 — End: 2018-03-22
  Administered 2018-03-22: 50 mg via ORAL

## 2018-03-22 NOTE — Telephone Encounter (Signed)
Appts already scheduled per 9/19 los.  No additional appts added.

## 2018-03-22 NOTE — Patient Instructions (Signed)
[facility] Discharge Instructions for Patients Receiving Chemotherapy  Today you received the following chemotherapy agents: Rituxan, Bendeka   To help prevent nausea and vomiting after your treatment, we encourage you to take your nausea medication as directed.    If you develop nausea and vomiting that is not controlled by your nausea medication, call the clinic.   BELOW ARE SYMPTOMS THAT SHOULD BE REPORTED IMMEDIATELY:  *FEVER GREATER THAN 100.5 F  *CHILLS WITH OR WITHOUT FEVER  NAUSEA AND VOMITING THAT IS NOT CONTROLLED WITH YOUR NAUSEA MEDICATION  *UNUSUAL SHORTNESS OF BREATH  *UNUSUAL BRUISING OR BLEEDING  TENDERNESS IN MOUTH AND THROAT WITH OR WITHOUT PRESENCE OF ULCERS  *URINARY PROBLEMS  *BOWEL PROBLEMS  UNUSUAL RASH Items with * indicate a potential emergency and should be followed up as soon as possible.  Feel free to call the clinic should you have any questions or concerns. The clinic phone number is [phone].  Please show the CHEMO ALERT CARD at check-in to the Emergency Department and triage nurse.   

## 2018-03-23 ENCOUNTER — Inpatient Hospital Stay: Payer: Managed Care, Other (non HMO)

## 2018-03-23 VITALS — BP 126/81 | HR 101 | Temp 97.9°F | Resp 18 | Ht 68.0 in | Wt 290.0 lb

## 2018-03-23 DIAGNOSIS — C8308 Small cell B-cell lymphoma, lymph nodes of multiple sites: Secondary | ICD-10-CM

## 2018-03-23 DIAGNOSIS — Z7189 Other specified counseling: Secondary | ICD-10-CM

## 2018-03-23 DIAGNOSIS — Z5111 Encounter for antineoplastic chemotherapy: Secondary | ICD-10-CM | POA: Diagnosis not present

## 2018-03-23 MED ORDER — SODIUM CHLORIDE 0.9 % IV SOLN
90.0000 mg/m2 | Freq: Once | INTRAVENOUS | Status: AC
  Administered 2018-03-23: 225 mg via INTRAVENOUS
  Filled 2018-03-23: qty 9

## 2018-03-23 MED ORDER — DEXAMETHASONE SODIUM PHOSPHATE 10 MG/ML IJ SOLN
INTRAMUSCULAR | Status: AC
  Filled 2018-03-23: qty 1

## 2018-03-23 MED ORDER — SODIUM CHLORIDE 0.9 % IV SOLN
Freq: Once | INTRAVENOUS | Status: AC
  Administered 2018-03-23: 10:00:00 via INTRAVENOUS
  Filled 2018-03-23: qty 250

## 2018-03-23 MED ORDER — DEXAMETHASONE SODIUM PHOSPHATE 10 MG/ML IJ SOLN
10.0000 mg | Freq: Once | INTRAMUSCULAR | Status: AC
  Administered 2018-03-23: 10 mg via INTRAVENOUS

## 2018-03-23 NOTE — Patient Instructions (Signed)
[facility] Discharge Instructions for Patients Receiving Chemotherapy  Today you received the following chemotherapy agents Bendamustine Verl Dicker)  To help prevent nausea and vomiting after your treatment, we encourage you to take your nausea medication as prescribed.   If you develop nausea and vomiting that is not controlled by your nausea medication, call the clinic.   BELOW ARE SYMPTOMS THAT SHOULD BE REPORTED IMMEDIATELY:  *FEVER GREATER THAN 100.5 F  *CHILLS WITH OR WITHOUT FEVER  NAUSEA AND VOMITING THAT IS NOT CONTROLLED WITH YOUR NAUSEA MEDICATION  *UNUSUAL SHORTNESS OF BREATH  *UNUSUAL BRUISING OR BLEEDING  TENDERNESS IN MOUTH AND THROAT WITH OR WITHOUT PRESENCE OF ULCERS  *URINARY PROBLEMS  *BOWEL PROBLEMS  UNUSUAL RASH Items with * indicate a potential emergency and should be followed up as soon as possible.  Feel free to call the clinic should you have any questions or concerns. The clinic phone number is [phone].  Please show the [place] at check-in to the Emergency Department and triage nurse.

## 2018-03-26 MED ORDER — ACYCLOVIR 400 MG PO TABS: 400.0000 mg | ORAL_TABLET | Freq: Every day | ORAL | 3 refills | Status: AC

## 2018-04-19 ENCOUNTER — Inpatient Hospital Stay (HOSPITAL_BASED_OUTPATIENT_CLINIC_OR_DEPARTMENT_OTHER): Payer: Managed Care, Other (non HMO) | Admitting: Hematology

## 2018-04-19 ENCOUNTER — Inpatient Hospital Stay: Payer: Managed Care, Other (non HMO)

## 2018-04-19 ENCOUNTER — Inpatient Hospital Stay: Payer: Managed Care, Other (non HMO) | Attending: Hematology

## 2018-04-19 ENCOUNTER — Telehealth: Payer: Self-pay

## 2018-04-19 VITALS — BP 102/72 | HR 96 | Temp 97.5°F | Resp 18

## 2018-04-19 VITALS — BP 124/79 | HR 83 | Temp 98.3°F | Resp 17 | Ht 68.0 in | Wt 285.6 lb

## 2018-04-19 DIAGNOSIS — Z79899 Other long term (current) drug therapy: Secondary | ICD-10-CM

## 2018-04-19 DIAGNOSIS — R109 Unspecified abdominal pain: Secondary | ICD-10-CM | POA: Insufficient documentation

## 2018-04-19 DIAGNOSIS — I10 Essential (primary) hypertension: Secondary | ICD-10-CM | POA: Diagnosis not present

## 2018-04-19 DIAGNOSIS — C8308 Small cell B-cell lymphoma, lymph nodes of multiple sites: Secondary | ICD-10-CM

## 2018-04-19 DIAGNOSIS — Z5112 Encounter for antineoplastic immunotherapy: Secondary | ICD-10-CM | POA: Insufficient documentation

## 2018-04-19 DIAGNOSIS — Z5111 Encounter for antineoplastic chemotherapy: Secondary | ICD-10-CM | POA: Diagnosis not present

## 2018-04-19 DIAGNOSIS — F419 Anxiety disorder, unspecified: Secondary | ICD-10-CM | POA: Diagnosis not present

## 2018-04-19 DIAGNOSIS — Z7189 Other specified counseling: Secondary | ICD-10-CM

## 2018-04-19 LAB — CBC WITH DIFFERENTIAL/PLATELET
Abs Immature Granulocytes: 0.06 10*3/uL (ref 0.00–0.07)
BASOS ABS: 0 10*3/uL (ref 0.0–0.1)
Basophils Relative: 1 %
EOS ABS: 0.6 10*3/uL — AB (ref 0.0–0.5)
Eosinophils Relative: 15 %
HEMATOCRIT: 46.7 % (ref 39.0–52.0)
HEMOGLOBIN: 16.6 g/dL (ref 13.0–17.0)
IMMATURE GRANULOCYTES: 2 %
Lymphocytes Relative: 19 %
Lymphs Abs: 0.7 10*3/uL (ref 0.7–4.0)
MCH: 31.1 pg (ref 26.0–34.0)
MCHC: 35.5 g/dL (ref 30.0–36.0)
MCV: 87.5 fL (ref 80.0–100.0)
Monocytes Absolute: 0.9 10*3/uL (ref 0.1–1.0)
Monocytes Relative: 23 %
NEUTROS ABS: 1.6 10*3/uL — AB (ref 1.7–7.7)
NEUTROS PCT: 40 %
NRBC: 0 % (ref 0.0–0.2)
Platelets: 153 10*3/uL (ref 150–400)
RBC: 5.34 MIL/uL (ref 4.22–5.81)
RDW: 12.9 % (ref 11.5–15.5)
WBC: 3.9 10*3/uL — AB (ref 4.0–10.5)

## 2018-04-19 LAB — CMP (CANCER CENTER ONLY)
ALT: 48 U/L — ABNORMAL HIGH (ref 0–44)
ANION GAP: 11 (ref 5–15)
AST: 40 U/L (ref 15–41)
Albumin: 4 g/dL (ref 3.5–5.0)
Alkaline Phosphatase: 83 U/L (ref 38–126)
BILIRUBIN TOTAL: 0.9 mg/dL (ref 0.3–1.2)
BUN: 8 mg/dL (ref 6–20)
CO2: 24 mmol/L (ref 22–32)
Calcium: 9.7 mg/dL (ref 8.9–10.3)
Chloride: 106 mmol/L (ref 98–111)
Creatinine: 0.76 mg/dL (ref 0.61–1.24)
Glucose, Bld: 133 mg/dL — ABNORMAL HIGH (ref 70–99)
POTASSIUM: 3.9 mmol/L (ref 3.5–5.1)
Sodium: 141 mmol/L (ref 135–145)
TOTAL PROTEIN: 6.8 g/dL (ref 6.5–8.1)

## 2018-04-19 LAB — LACTATE DEHYDROGENASE: LDH: 281 U/L — ABNORMAL HIGH (ref 98–192)

## 2018-04-19 MED ORDER — HYDROXYZINE HCL 25 MG PO TABS: 25.0000 mg | ORAL_TABLET | Freq: Three times a day (TID) | ORAL | 0 refills | Status: DC | PRN

## 2018-04-19 MED ORDER — DEXAMETHASONE SODIUM PHOSPHATE 10 MG/ML IJ SOLN
10.0000 mg | Freq: Once | INTRAMUSCULAR | Status: AC
  Administered 2018-04-19: 10 mg via INTRAVENOUS

## 2018-04-19 MED ORDER — DEXAMETHASONE SODIUM PHOSPHATE 10 MG/ML IJ SOLN
INTRAMUSCULAR | Status: AC
  Filled 2018-04-19: qty 1

## 2018-04-19 MED ORDER — SODIUM CHLORIDE 0.9 % IV SOLN
Freq: Once | INTRAVENOUS | Status: AC
  Administered 2018-04-19: 11:00:00 via INTRAVENOUS
  Filled 2018-04-19: qty 250

## 2018-04-19 MED ORDER — ACETAMINOPHEN 325 MG PO TABS
ORAL_TABLET | ORAL | Status: AC
  Filled 2018-04-19: qty 2

## 2018-04-19 MED ORDER — SODIUM CHLORIDE 0.9 % IV SOLN
375.0000 mg/m2 | Freq: Once | INTRAVENOUS | Status: AC
  Administered 2018-04-19: 900 mg via INTRAVENOUS
  Filled 2018-04-19: qty 40

## 2018-04-19 MED ORDER — PALONOSETRON HCL INJECTION 0.25 MG/5ML
0.2500 mg | Freq: Once | INTRAVENOUS | Status: AC
  Administered 2018-04-19: 0.25 mg via INTRAVENOUS

## 2018-04-19 MED ORDER — PALONOSETRON HCL INJECTION 0.25 MG/5ML
INTRAVENOUS | Status: AC
  Filled 2018-04-19: qty 5

## 2018-04-19 MED ORDER — DIPHENHYDRAMINE HCL 25 MG PO CAPS
50.0000 mg | ORAL_CAPSULE | Freq: Once | ORAL | Status: AC
  Administered 2018-04-19: 50 mg via ORAL

## 2018-04-19 MED ORDER — ACETAMINOPHEN 325 MG PO TABS
650.0000 mg | ORAL_TABLET | Freq: Once | ORAL | Status: AC
  Administered 2018-04-19: 650 mg via ORAL

## 2018-04-19 MED ORDER — DIPHENHYDRAMINE HCL 25 MG PO CAPS
ORAL_CAPSULE | ORAL | Status: AC
  Filled 2018-04-19: qty 2

## 2018-04-19 MED ORDER — SODIUM CHLORIDE 0.9 % IV SOLN
90.0000 mg/m2 | Freq: Once | INTRAVENOUS | Status: AC
  Administered 2018-04-19: 225 mg via INTRAVENOUS
  Filled 2018-04-19: qty 9

## 2018-04-19 NOTE — Progress Notes (Signed)
HEMATOLOGY/ONCOLOGY CLINIC NOTE  Date of Service: 04/19/18  Patient Care Team: [name], MD as PCP - General (Family Medicine)  CHIEF COMPLAINTS/PURPOSE OF CONSULTATION:  F/u for mx of CLL/SLL  HISTORY OF PRESENTING ILLNESS:   Jordan Schroeder is a wonderful 37 y.o. 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 He is accompanied today by his mother. The pt reports that he is doing well overall.   The pt reports that he has not had any significant medical problems in the past. He notes that on an annual physical, his PCP noticed his WBC was high, and after repeat blood tests, he was referred to Dr. Nicholas Lose. Dr. Lindi Adie has already begun the initial work ups as noted below. The pt notes that he does not notice any change in his energy levels or feeling any different over the last 6 months.   He notes that he has some concerns for sleep apnea as he snores a lot at night but does not wake up due to SOB.   Of note prior to the patient's visit today, pt has had CT C/A/P completed on 10/11/17 with results revealing Lymphadenopathy throughout the chest, abdomen, and pelvis.   On 09/21/17 the pt also had a Flow Cytometry revealing MONOCLONAL B-CELL POPULATION IDENTIFIED.  Most recent lab results (09/21/17) of CBC  is as follows: all values are WNL except for WBC at 23.4k, Lymphs Abs at 15.9k, Monocytes Abs at 1.1k. LDH 09/21/17 was WNL at 214. FISH 09/21/12 is pending.  [facility] 10/05/17 revealed CEP 12 at 100%,  13q14.3 is at 47.67% for normal nuclei, and 70.96% with mono allelic deletion of [id number]. CL TP53 for 17p13 revealed 100% with normal nuclei.  ATM for 11q22.3 revealed 81.67% with normal nuclei and 18.33% with positive nuclei for del 11q22.3  On review of systems, pt reports some lower abdominal pain, good energy levels, and denies fevers, chills, night sweats, weight loss, diarrhea, blood in the stools,  black stools, problems passing urine, and any other symptoms.   On PMHx the pt reports HTN. . On Social Hx the pt denies smoking at any point, any ETOH consumption, and illicit drug use.  Interval History:  Jordan Schroeder returns today regarding his recently diagnosed CLL/SLL and prior to C6 of BR treatment. The patient's last visit with [place] was on 03/22/18. He is accompanied today by his mother. The pt reports that he is doing well overall.   The pt reports that he had some nausea for a couple days after his last treatment and threw up. He does have nausea medication, and also notes that his nausea is correlated with anxiety. He has continued to have good energy levels.   The pt notes that he has scheduled a sleep study as previously recommended.   Lab results today (04/19/18) of CBC w/diff, CMP is as follows: all values are WNL except for WBC at 3.9k, ANC at 1.6k, Eosinophils abs at 600, Glucose at 133, ALT at 48. 04/19/18 LDH is at 281  On review of systems, pt reports good energy levels, recent anxiety and nausea, and denies concerns for infections, fevers, chills, night sweats, abdominal pains, leg swelling, and any other symptoms.    MEDICAL HISTORY:  Past Medical History:  Diagnosis Date  . Hypertension     SURGICAL HISTORY: No past surgical history on file.  SOCIAL HISTORY: Social History   Socioeconomic History  .  Marital status: Married    Spouse name: Not on file  . Number of children: Not on file  . Years of education: Not on file  . Highest education level: Not on file  Occupational History  . Not on file  Social Needs  . Financial resource strain: Not on file  . Food insecurity:    Worry: Not on file    Inability: Not on file  . Transportation needs:    Medical: Not on file    Non-medical: Not on file  Tobacco Use  . Smoking status: Never Smoker  . Smokeless tobacco: Never Used  Substance and Sexual Activity  . Alcohol use: Not Currently  . Drug use:  Never  . Sexual activity: Not on file  Lifestyle  . Physical activity:    Days per week: Not on file    Minutes per session: Not on file  . Stress: Not on file  Relationships  . Social connections:    Talks on phone: Not on file    Gets together: Not on file    Attends religious service: Not on file    Active member of club or organization: Not on file    Attends meetings of clubs or organizations: Not on file    Relationship status: Not on file  . Intimate partner violence:    Fear of current or ex partner: Not on file    Emotionally abused: Not on file    Physically abused: Not on file    Forced sexual activity: Not on file  Other Topics Concern  . Not on file  Social History Narrative  . Not on file    FAMILY HISTORY: No family history on file.  ALLERGIES:  has No Known Allergies.  MEDICATIONS:  Current Outpatient Medications  Medication Sig Dispense Refill  . acyclovir (ZOVIRAX) 400 MG tablet Take 1 tablet (400 mg total) by mouth daily. 30 tablet 3  . dexamethasone (DECADRON) 4 MG tablet Take 2 tablets (8 mg total) by mouth daily. Start the day after bendamustine chemotherapy for 2 days. Take with food. 30 tablet 1  . hydrOXYzine (ATARAX/VISTARIL) 25 MG tablet Take 1 tablet (25 mg total) by mouth 3 (three) times daily as needed for anxiety or nausea. 30 tablet 0  . lisinopril (PRINIVIL,ZESTRIL) 10 MG tablet Take 10 mg by mouth daily.    . ondansetron (ZOFRAN) 8 MG tablet Take 1 tablet (8 mg total) by mouth 2 (two) times daily as needed for refractory nausea / vomiting. Start on day 2 after bendamustine chemo. 30 tablet 1  . prochlorperazine (COMPAZINE) 10 MG tablet Take 1 tablet (10 mg total) by mouth every 6 (six) hours as needed (Nausea or vomiting). 30 tablet 1  . sertraline (ZOLOFT) 100 MG tablet Take 100 mg by mouth daily.     No current facility-administered medications for this visit.     REVIEW OF SYSTEMS:    A 10+ POINT REVIEW OF SYSTEMS WAS OBTAINED  including neurology, dermatology, psychiatry, cardiac, respiratory, lymph, extremities, GI, GU, Musculoskeletal, constitutional, breasts, reproductive, HEENT.  All pertinent positives are noted in the HPI.  All others are negative.   PHYSICAL EXAMINATION: ECOG PERFORMANCE STATUS: 1 - Symptomatic but completely ambulatory  Vitals:   04/19/18 0842 04/19/18 0905  BP: 109/84 124/79  Pulse: (!) 111 83  Resp: 18 17  Temp: 98.6 F (37 C) 98.3 F (36.8 C)  SpO2: 96% 96%   Filed Weights   04/19/18 0842  Weight: 285 lb 9.6  oz (129.5 kg)   .Body mass index is 43.43 kg/m.   GENERAL:alert, in no acute distress and comfortable SKIN: no acute rashes, no significant lesions EYES: conjunctiva are pink and non-injected, sclera anicteric OROPHARYNX: MMM, no exudates, no oropharyngeal erythema or ulceration NECK: supple, no JVD LYMPH:  Minimal palpable supraclavicular and cervical lymphadenopathy, no palpable lymphadenopathy in the inguinal region LUNGS: clear to auscultation b/l with normal respiratory effort HEART: regular rate & rhythm ABDOMEN:  normoactive bowel sounds , non tender, not distended. No palpable hepatosplenomegaly.  Extremity: no pedal edema PSYCH: alert & oriented x 3 with fluent speech NEURO: no focal motor/sensory deficits   LABORATORY DATA:  I have reviewed the data as listed  . CBC Latest Ref Rng & Units 04/19/2018 03/22/2018 02/22/2018  WBC 4.0 - 10.5 K/uL 3.9(L) 3.8(L) 4.3  Hemoglobin 13.0 - 17.0 g/dL 16.6 16.6 16.0  Hematocrit 39.0 - 52.0 % 46.7 48.2 46.6  Platelets 150 - 400 K/uL 153 149 129(L)    . CMP Latest Ref Rng & Units 04/19/2018 03/22/2018 02/22/2018  Glucose 70 - 99 mg/dL 133(H) 111(H) 119(H)  BUN 6 - 20 mg/dL '8 8 7  ' Creatinine 0.61 - 1.24 mg/dL 0.76 0.73 0.74  Sodium 135 - 145 mmol/L 141 141 141  Potassium 3.5 - 5.1 mmol/L 3.9 4.2 4.1  Chloride 98 - 111 mmol/L 106 108 107  CO2 22 - 32 mmol/L '24 23 25  ' Calcium 8.9 - 10.3 mg/dL 9.7 9.5 9.2  Total  Protein 6.5 - 8.1 g/dL 6.8 6.8 6.2(L)  Total Bilirubin 0.3 - 1.2 mg/dL 0.9 0.7 0.4  Alkaline Phos 38 - 126 U/L 83 74 81  AST 15 - 41 U/L 40 34 26  ALT 0 - 44 U/L 48(H) 41 36   Component     Latest Ref Rng & Units 10/30/2017  HIV Screen 4th Generation wRfx     Non Reactive Non Reactive  HCV Ab     0.0 - 0.9 s/co ratio <0.1  Hepatitis B Surface Ag     Negative Negative  Hep B Core Ab, Tot     Negative Negative      10/25/17 Tissue Flow Cytometry:    10/25/17 Needle/core Bx:   FISH Oncology  Order: [id number]  Status:  Final result Visible to patient:  No (Not Released) Next appt:  11/07/2017 at 09:00 AM in Radiology ([facility] PET) Dx:  Lymphocytosis  Component 66moago  Specimen Type Comment:   Comment: BLOOD  Cells Counted 200   Cells Analyzed 200   FISH Result Comment:   Comment: NORMAL: NO CYCLIN D1 OR IGH GENE REARRANGEMENT OBSERVED  Interpretation Comment:   Comment: (NOTE)        nuc ish 11q13(CCND1x2),14q32(IGHx2)[200]    The fluorescence in situ hybridization (FISH) result  was normal. Dual color dual fusion DNA FISH probes targeting  the cyclin D1 gene (CCND1 or BCL1)([employer] and the  heavy chain immunoglobulin gene (IgH) at 14q32 showed two  hybridization signals each with no fusion in all cells  examined. Thus, there was NO apparent rearrangement of the  primary genes involved with mantle cell lymphoma, and to a  lesser extent myeloma. .Marland Kitchen        RADIOGRAPHIC STUDIES: I have personally reviewed the radiological images as listed and agreed with the findings in the report. No results found.  ASSESSMENT & PLAN:   37y.o. male with  1. 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  -Reviewed 10/11/17 CT of  C/A/P which revealed lymphadenopathy throughout the chest, abdomen and pelvis. Most notably in the axilla and abdomen.   -Noted 13q deletion: 13q14.3 is at 47.67% for normal nuclei, and 14.78% with mono allelic deletion of [id number].  -ATM for 11q22.3 revealed 81.67% with normal nuclei and 18.33% with positive nuclei for del 11q22.3 ---> point more to CLL/SLL   02/26/18 CT C/A/P revealed  Marked response to therapy as evidenced by decrease in size or resolution of adenopathy throughout chest, abdomen and pelvis. 2. Hepatic steatosis. Slight marginal irregularity suggests cirrhosis. 3. Enlarged pulmonary arteries, indicative of pulmonary arterial hypertension.   2. Rituxan infusion reaction-  -Had significant flushing with rituxan needing steroids - will hold off rapid infusion protocol.  PLAN: -Discussed pt labwork today, 04/19/18; borderline neutropenia with ANC at 1.6k. Blood counts and chemistries are stable. LDH at 281.  -The pt has no prohibitive toxicities from continuing C6 BR at this time.   -Will order Hydroxyzine for short term anxiety, which the pt would prefer  -Discussed the recommendation to receive flu shot and both pneumonia vaccines -The pt will receive flu shot today in clinic -Will see the pt back in 7 weeks with repeat CT  3. Likely sleep apnea -Advised that the pt be evaluated for sleep apnea, as he snores a lot and has enlarged pulmonary arteries -Pt has scheduled a sleep study with PCP    Ct chest/abd/pelvis in 6 weeks Flu shot today RTC with Dr Irene Limbo with labs in 7 weeks   All of the patients questions were answered with apparent satisfaction. The patient knows to call the clinic with any problems, questions or concerns.  The total time spent in the appt was 25 minutes and more than 50% was on counseling and direct patient cares.    Sullivan Lone MD MS AAHIVMS [facility] Hematology/Oncology Physician [facility]  (Office):       [phone] (Work  cell):  [phone] (Fax):           [phone]  04/19/2018 10:12 AM   I, Baldwin Jamaica, am acting as a scribe for Dr. Irene Limbo  .I have reviewed the above documentation for accuracy and completeness, and I agree with the above. Brunetta Genera MD

## 2018-04-19 NOTE — Telephone Encounter (Signed)
Per 10/17 [facility] verbal request confirming to cancel additional appointments that was scheduled beyond the 6 cycle. Patient is aware of the cancelations.

## 2018-04-19 NOTE — Patient Instructions (Signed)
[facility] Center Discharge Instructions for Patients Receiving Chemotherapy  Today you received the following chemotherapy agents: Rituxan, Bendeka   To help prevent nausea and vomiting after your treatment, we encourage you to take your nausea medication as directed.    If you develop nausea and vomiting that is not controlled by your nausea medication, call the clinic.   BELOW ARE SYMPTOMS THAT SHOULD BE REPORTED IMMEDIATELY:  *FEVER GREATER THAN 100.5 F  *CHILLS WITH OR WITHOUT FEVER  NAUSEA AND VOMITING THAT IS NOT CONTROLLED WITH YOUR NAUSEA MEDICATION  *UNUSUAL SHORTNESS OF BREATH  *UNUSUAL BRUISING OR BLEEDING  TENDERNESS IN MOUTH AND THROAT WITH OR WITHOUT PRESENCE OF ULCERS  *URINARY PROBLEMS  *BOWEL PROBLEMS  UNUSUAL RASH Items with * indicate a potential emergency and should be followed up as soon as possible.  Feel free to call the clinic should you have any questions or concerns. The clinic phone number is [phone].  Please show the CHEMO ALERT CARD at check-in to the Emergency Department and triage nurse.   

## 2018-04-20 ENCOUNTER — Inpatient Hospital Stay: Payer: Managed Care, Other (non HMO)

## 2018-04-20 VITALS — BP 122/75 | HR 93 | Temp 97.9°F | Resp 14

## 2018-04-20 DIAGNOSIS — C8308 Small cell B-cell lymphoma, lymph nodes of multiple sites: Secondary | ICD-10-CM

## 2018-04-20 DIAGNOSIS — Z7189 Other specified counseling: Secondary | ICD-10-CM

## 2018-04-20 MED ORDER — SODIUM CHLORIDE 0.9 % IV SOLN
Freq: Once | INTRAVENOUS | Status: AC
  Administered 2018-04-20: 09:00:00 via INTRAVENOUS
  Filled 2018-04-20: qty 250

## 2018-04-20 MED ORDER — DEXAMETHASONE SODIUM PHOSPHATE 10 MG/ML IJ SOLN
10.0000 mg | Freq: Once | INTRAMUSCULAR | Status: AC
  Administered 2018-04-20: 10 mg via INTRAVENOUS

## 2018-04-20 MED ORDER — DEXAMETHASONE SODIUM PHOSPHATE 10 MG/ML IJ SOLN
INTRAMUSCULAR | Status: AC
  Filled 2018-04-20: qty 1

## 2018-04-20 MED ORDER — SODIUM CHLORIDE 0.9 % IV SOLN
90.0000 mg/m2 | Freq: Once | INTRAVENOUS | Status: AC
  Administered 2018-04-20: 225 mg via INTRAVENOUS
  Filled 2018-04-20: qty 9

## 2018-04-20 NOTE — Patient Instructions (Signed)
[facility] Discharge Instructions for Patients Receiving Chemotherapy  Today you received the following chemotherapy agents Bendamustine Verl Dicker)  To help prevent nausea and vomiting after your treatment, we encourage you to take your nausea medication as prescribed.   If you develop nausea and vomiting that is not controlled by your nausea medication, call the clinic.   BELOW ARE SYMPTOMS THAT SHOULD BE REPORTED IMMEDIATELY:  *FEVER GREATER THAN 100.5 F  *CHILLS WITH OR WITHOUT FEVER  NAUSEA AND VOMITING THAT IS NOT CONTROLLED WITH YOUR NAUSEA MEDICATION  *UNUSUAL SHORTNESS OF BREATH  *UNUSUAL BRUISING OR BLEEDING  TENDERNESS IN MOUTH AND THROAT WITH OR WITHOUT PRESENCE OF ULCERS  *URINARY PROBLEMS  *BOWEL PROBLEMS  UNUSUAL RASH Items with * indicate a potential emergency and should be followed up as soon as possible.  Feel free to call the clinic should you have any questions or concerns. The clinic phone number is [phone].  Please show the [place] at check-in to the Emergency Department and triage nurse.

## 2018-04-26 NOTE — Progress Notes (Signed)
FMLA paperwork successfully faxed to [place] at [phone]. Mailed copy to patient address on file.

## 2018-05-02 ENCOUNTER — Encounter: Payer: Self-pay | Admitting: Neurology

## 2018-05-02 ENCOUNTER — Ambulatory Visit (INDEPENDENT_AMBULATORY_CARE_PROVIDER_SITE_OTHER): Payer: Managed Care, Other (non HMO) | Admitting: Neurology

## 2018-05-02 VITALS — BP 116/78 | HR 80 | Ht 68.0 in | Wt 282.0 lb

## 2018-05-02 DIAGNOSIS — C9110 Chronic lymphocytic leukemia of B-cell type not having achieved remission: Secondary | ICD-10-CM

## 2018-05-02 DIAGNOSIS — G4719 Other hypersomnia: Secondary | ICD-10-CM | POA: Diagnosis not present

## 2018-05-02 DIAGNOSIS — R0683 Snoring: Secondary | ICD-10-CM | POA: Diagnosis not present

## 2018-05-02 DIAGNOSIS — R351 Nocturia: Secondary | ICD-10-CM | POA: Diagnosis not present

## 2018-05-02 DIAGNOSIS — Z6841 Body Mass Index (BMI) 40.0 and over, adult: Secondary | ICD-10-CM

## 2018-05-02 DIAGNOSIS — R258 Other abnormal involuntary movements: Secondary | ICD-10-CM

## 2018-05-02 DIAGNOSIS — G479 Sleep disorder, unspecified: Secondary | ICD-10-CM

## 2018-05-02 NOTE — Progress Notes (Signed)
Subjective:    Patient ID: Jordan Schroeder is a 37 y.o. male.  HPI     History:  Dear Benjamine Mola,  I saw your patient, Jordan Schroeder, upon your kind request in my sleep clinic today for initial consultation of his sleep disorder, in particular, concern for underlying obstructive sleep apnea. The patient is accompanied by his wife today. As you know, Jordan Schroeder is a 37 year old right-handed gentleman with an underlying medical history of hypertension, hyperlipidemia, history of CLL (NHL) and morbid obesity with BMI of over 40, who reports snoring and excessive daytime somnolence. I reviewed your office note from 04/21/2018 which you kindly included. His Epworth sleepiness score is 8 out of 24, fatigue score is 13 out of 63. His wife has noted the occasional snorting sound while he is asleep and notices leg movements while he is sleeping. He denies any telltale symptoms of restless legs syndrome but does endorse being a restless sleeper and tossing and turning at times. When he was working he tried to keep a set schedule for his sleep and rise time, typically in bed by 9 and rise time was 5:30. He is currently on disability because of his cancer. He does not have a very stringent schedule for his sleep at this moment. He does watch TV while in bed but turns it off at night before falling asleep. They have cats in the household but typically not in their bedroom or on their bed. He does have nocturia about twice per average night but denies morning headaches. He had a tonsillectomy. His father has sleep apnea and uses a CPAP machine. His weight has been fluctuating. His oncologist/hematologist has encouraged him to seek testing for sleep apnea.  His Past Medical History Is Significant For: Past Medical History:  Diagnosis Date  . Hypertension     His Past Surgical History Is Significant For: No past surgical history on file.  His Family History Is Significant For: No family history on file.  His  Social History Is Significant For: Social History   Socioeconomic History  . Marital status: Married    Spouse name: Not on file  . Number of children: Not on file  . Years of education: Not on file  . Highest education level: Not on file  Occupational History  . Not on file  Social Needs  . Financial resource strain: Not on file  . Food insecurity:    Worry: Not on file    Inability: Not on file  . Transportation needs:    Medical: Not on file    Non-medical: Not on file  Tobacco Use  . Smoking status: Never Smoker  . Smokeless tobacco: Never Used  Substance and Sexual Activity  . Alcohol use: Not Currently  . Drug use: Never  . Sexual activity: Not on file  Lifestyle  . Physical activity:    Days per week: Not on file    Minutes per session: Not on file  . Stress: Not on file  Relationships  . Social connections:    Talks on phone: Not on file    Gets together: Not on file    Attends religious service: Not on file    Active member of club or organization: Not on file    Attends meetings of clubs or organizations: Not on file    Relationship status: Not on file  Other Topics Concern  . Not on file  Social History Narrative  . Not on file    His Allergies  Are:  No Known Allergies:   His Current Medications Are:  Outpatient Encounter Medications as of 05/02/2018  Medication Sig  . acyclovir (ZOVIRAX) 400 MG tablet Take 1 tablet (400 mg total) by mouth daily.  Marland Kitchen dexamethasone (DECADRON) 4 MG tablet Take 2 tablets (8 mg total) by mouth daily. Start the day after bendamustine chemotherapy for 2 days. Take with food.  . fenofibrate micronized (LOFIBRA) 67 MG capsule Take 67 mg by mouth daily before breakfast.  . hydrOXYzine (ATARAX/VISTARIL) 25 MG tablet Take 1 tablet (25 mg total) by mouth 3 (three) times daily as needed for anxiety or nausea.  Marland Kitchen lisinopril (PRINIVIL,ZESTRIL) 10 MG tablet Take 10 mg by mouth daily.  . ondansetron (ZOFRAN) 8 MG tablet Take 1 tablet  (8 mg total) by mouth 2 (two) times daily as needed for refractory nausea / vomiting. Start on day 2 after bendamustine chemo.  . prochlorperazine (COMPAZINE) 10 MG tablet Take 1 tablet (10 mg total) by mouth every 6 (six) hours as needed (Nausea or vomiting).  . sertraline (ZOLOFT) 100 MG tablet Take 100 mg by mouth daily.   No facility-administered encounter medications on file as of 05/02/2018.   :  Review of Systems:  Out of a complete 14 point review of systems, all are reviewed and negative with the exception of these symptoms as listed below: Review of Systems  Neurological:       Pt presents today to discuss his sleep. Pt has never had a sleep study but does endorse snoring.  Epworth Sleepiness Scale 0= would never doze 1= slight chance of dozing 2= moderate chance of dozing 3= high chance of dozing  Sitting and reading: 2 Watching TV: 2 Sitting inactive in a public place (ex. Theater or meeting): 2 As a passenger in a car for an hour without a break: 0 Lying down to rest in the afternoon: 2 Sitting and talking to someone: 0 Sitting quietly after lunch (no alcohol): 0 In a car, while stopped in traffic: 0 Total: 8     Objective:  Neurological Exam  Physical Exam Physical Examination:   Vitals:   05/02/18 1309  BP: 116/78  Pulse: 80   General Examination: The patient is a very pleasant 37 y.o. male in no acute distress. He appears well-developed and well-nourished and well groomed.   HEENT: Normocephalic, atraumatic, pupils are equal, round and reactive to light and accommodation. Funduscopic exam is normal with sharp disc margins noted. Extraocular tracking is good without limitation to gaze excursion or nystagmus noted. Normal smooth pursuit is noted. Hearing is grossly intact. Tympanic membranes are clear bilaterally. Face is symmetric with normal facial animation and normal facial sensation. Speech is clear with no dysarthria noted. There is no hypophonia. There  is no lip, neck/head, jaw or voice tremor. Neck is supple with full range of passive and active motion. There are no carotid bruits on auscultation. Oropharynx exam reveals: mild mouth dryness, adequate dental hygiene and mild airway crowding, due to smaller airway entry and wider uvula. Tonsils are absent. Mallampati is class III. Neck circumference is 17-3/4 inches. Tongue protrudes centrally and palate elevates symmetrically. He has a mild overbite.  Chest: Clear to auscultation without wheezing, rhonchi or crackles noted.  Heart: S1+S2+0, regular and normal without murmurs, rubs or gallops noted.   Abdomen: Soft, non-tender and non-distended with normal bowel sounds appreciated on auscultation.  Extremities: There is no pitting edema in the distal lower extremities bilaterally. Pedal pulses are intact.  Skin: Warm  and dry without trophic changes noted.  Musculoskeletal: exam reveals no obvious joint deformities, tenderness or joint swelling or erythema. Unremarkable scars from knee surgeries bilaterally.  Neurologically:  Mental status: The patient is awake, alert and oriented in all 4 spheres. His immediate and remote memory, attention, language skills and fund of knowledge are appropriate. There is no evidence of aphasia, agnosia, apraxia or anomia. Speech is clear with normal prosody and enunciation. Thought process is linear. Mood is normal and affect is normal.  Cranial nerves II - XII are as described above under HEENT exam. In addition: shoulder shrug is normal with equal shoulder height noted. Motor exam: Normal bulk, strength and tone is noted. There is no drift, tremor or rebound. Romberg is negative. Reflexes are 2+ throughout. Fine motor skills and coordination: grossly intact.  Cerebellar testing: No dysmetria or intention tremor. There is no truncal or gait ataxia.  Sensory exam: intact to light touch.  Gait, station and balance: He stands easily. No veering to one side is  noted. No leaning to one side is noted. Posture is age-appropriate and stance is narrow based. Gait shows normal stride length and normal pace. No problems turning are noted. Tandem walk is unremarkable. Intact toe and heel stance is noted.               Assessment and Plan:   In summary, Jordan Schroeder is a very pleasant 37 y.o.-year old male with an underlying medical history of hypertension, hyperlipidemia, history of CLL (NHL) and morbid obesity with BMI of over 40, whose history and physical exam are concerning for obstructive sleep apnea (OSA). I had a long chat with the patient and his wife about my findings and the diagnosis of OSA, its prognosis and treatment options. We talked about medical treatments, surgical interventions and non-pharmacological approaches. I explained in particular the risks and ramifications of untreated moderate to severe OSA, especially with respect to developing cardiovascular disease down the Road, including congestive heart failure, difficult to treat hypertension, cardiac arrhythmias, or stroke. Even type 2 diabetes has, in part, been linked to untreated OSA. Symptoms of untreated OSA include daytime sleepiness, memory problems, mood irritability and mood disorder such as depression and anxiety, lack of energy, as well as recurrent headaches, especially morning headaches. We talked about trying to maintain a healthy lifestyle in general, as well as the importance of weight control. I encouraged the patient to eat healthy, exercise daily and keep well hydrated, to keep a scheduled bedtime and wake time routine, to not skip any meals and eat healthy snacks in between meals. I advised the patient not to drive when feeling sleepy. I recommended the following at this time: sleep study with potential positive airway pressure titration. (We will score hypopneas at 4%).   I explained the sleep test procedure to the patient and also outlined possible surgical and non-surgical  treatment options of OSA, including the use of a custom-made dental device (which would require a referral to a specialist dentist or oral surgeon), upper airway surgical options, such as pillar implants, radiofrequency surgery, tongue base surgery, and UPPP (which would involve a referral to an ENT surgeon). Rarely, jaw surgery such as mandibular advancement may be considered.  I also explained the CPAP treatment option to the patient, who indicated that he would be willing to try CPAP if the need arises. I explained the importance of being compliant with PAP treatment, not only for insurance purposes but primarily to improve His symptoms, and for  the patient's long term health benefit, including to reduce His cardiovascular risks. I answered all their questions today and the patient and his wife were in agreement. I would like to see him back after the sleep study is completed and encouraged him to call with any interim questions, concerns, problems or updates.   Thank you very much for allowing me to participate in the care of this nice patient. If I can be of any further assistance to you please do not hesitate to call me at [phone].  Sincerely,   Star Age, MD, PhD

## 2018-05-02 NOTE — Patient Instructions (Signed)

## 2018-05-09 ENCOUNTER — Telehealth: Payer: Self-pay | Admitting: *Deleted

## 2018-05-09 NOTE — Telephone Encounter (Signed)
Patient and mother called together. Patient's return to work note for [employer] has RTW on 11/23 in all spaces except one. The HR department requires all dates match. Patient asked if Dr. Irene Limbo would write a note stating that RTW date is 11/23 and fax to [place] [phone].  Advised them that Dr. Irene Limbo out of office for this week and next. They will bring original form back to office tomorrow for review to determine next steps.

## 2018-05-10 ENCOUNTER — Telehealth: Payer: Self-pay | Admitting: *Deleted

## 2018-05-10 NOTE — Telephone Encounter (Signed)
Contacted patient to inform him that his FMLA/leave paperwork from employer had been given to Ivin Poot, [employer] specialist. She will follow up with his employer regarding return to work date and length of intermittent FMLA. Will contact him tomorrow with disposition of ppwk.

## 2018-05-11 ENCOUNTER — Telehealth: Payer: Self-pay | Admitting: *Deleted

## 2018-05-11 NOTE — Telephone Encounter (Signed)
Contacted patient. Informed this AM by Eda Keys FMLA specialist, that FMLA paperwork has been clarified with [employer] HR. Copy to be mailed to patient. Informed him that ppwk states he is to return to work 11/23 with intermittent FMLA in place for medical appointments. Verbalized understanding and thanked caller.

## 2018-05-17 ENCOUNTER — Ambulatory Visit: Payer: Managed Care, Other (non HMO)

## 2018-05-17 ENCOUNTER — Other Ambulatory Visit: Payer: Managed Care, Other (non HMO)

## 2018-05-17 ENCOUNTER — Ambulatory Visit: Payer: Managed Care, Other (non HMO) | Admitting: Hematology

## 2018-05-18 ENCOUNTER — Ambulatory Visit: Payer: Managed Care, Other (non HMO)

## 2018-05-28 ENCOUNTER — Ambulatory Visit (HOSPITAL_COMMUNITY): Payer: Managed Care, Other (non HMO)

## 2018-06-04 ENCOUNTER — Ambulatory Visit (HOSPITAL_COMMUNITY)
Admission: RE | Admit: 2018-06-04 | Discharge: 2018-06-04 | Disposition: A | Discharge status: Home / Self Care | Payer: Managed Care, Other (non HMO) | Source: Ambulatory Visit | Attending: Hematology | Admitting: Hematology

## 2018-06-04 DIAGNOSIS — C8308 Small cell B-cell lymphoma, lymph nodes of multiple sites: Secondary | ICD-10-CM | POA: Insufficient documentation

## 2018-06-04 MED ORDER — IOHEXOL 300 MG/ML  SOLN
100.0000 mL | Freq: Once | INTRAMUSCULAR | Status: AC | PRN
  Administered 2018-06-04: 100 mL via INTRAVENOUS

## 2018-06-04 MED ORDER — SODIUM CHLORIDE (PF) 0.9 % IJ SOLN
INTRAMUSCULAR | Status: AC
  Filled 2018-06-04: qty 50

## 2018-06-06 NOTE — Progress Notes (Signed)
HEMATOLOGY/ONCOLOGY CLINIC NOTE  Date of Service: 06/07/18  Patient Care Team: [name], MD as PCP - General (Family Medicine)  CHIEF COMPLAINTS/PURPOSE OF CONSULTATION:  F/u for mx of CLL/SLL  HISTORY OF PRESENTING ILLNESS:   Jordan Schroeder is a wonderful 37 y.o. 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 He is accompanied today by his mother. The pt reports that he is doing well overall.   The pt reports that he has not had any significant medical problems in the past. He notes that on an annual physical, his PCP noticed his WBC was high, and after repeat blood tests, he was referred to Dr. Nicholas Lose. Dr. Lindi Adie has already begun the initial work ups as noted below. The pt notes that he does not notice any change in his energy levels or feeling any different over the last 6 months.   He notes that he has some concerns for sleep apnea as he snores a lot at night but does not wake up due to SOB.   Of note prior to the patient's visit today, pt has had CT C/A/P completed on 10/11/17 with results revealing Lymphadenopathy throughout the chest, abdomen, and pelvis.   On 09/21/17 the pt also had a Flow Cytometry revealing MONOCLONAL B-CELL POPULATION IDENTIFIED.  Most recent lab results (09/21/17) of CBC  is as follows: all values are WNL except for WBC at 23.4k, Lymphs Abs at 15.9k, Monocytes Abs at 1.1k. LDH 09/21/17 was WNL at 214. FISH 09/21/12 is pending.  [facility] 10/05/17 revealed CEP 12 at 100%,  13q14.3 is at 47.67% for normal nuclei, and 15.52% with mono allelic deletion of [id number]. CL TP53 for 17p13 revealed 100% with normal nuclei.  ATM for 11q22.3 revealed 81.67% with normal nuclei and 18.33% with positive nuclei for del 11q22.3  On review of systems, pt reports some lower abdominal pain, good energy levels, and denies fevers, chills, night sweats, weight loss, diarrhea, blood in the stools,  black stools, problems passing urine, and any other symptoms.   On PMHx the pt reports HTN. . On Social Hx the pt denies smoking at any point, any ETOH consumption, and illicit drug use.  Interval History:  Jordan Schroeder returns today for management and evaluation of his CLL/SLL after completing 6 planned cycles of BR treatment on 04/19/18. The patient's last visit with [place] was on 04/19/18. The pt reports that he is doing well overall.   The pt reports that he is doing much better today. He notes that his nausea and abdominal pain have improved. He notes that he is eating well. He returned to full time employment last week and notes that this has been keeping him very active as his job requires lots of walking and lifting at [employer]   Of note since the patient's last visit, pt has had a CT C/A/P completed on 06/04/18 with results revealing Stable CT chest without thoracic lymphadenopathy. Continued further decrease in abdominal and pelvic lymphadenopathy. No new or progressive lymphadenopathy on today's exam. 2. Hepatic steatosis. Subtle nodularity of liver contour raises the question of cirrhosis. 3. Pulmonary arterial enlargement. Pulmonary arterial hypertension a distinct concern.  Lab results today (06/07/18) of CBC w/diff and CMP is as follows: all values are WNL except for WBC at 3.3k, Lymphs abs at 400, Abs immature granulocytes at 0.10k, Glucose at 117, Total Protein at 6.4. 06/07/18 LDH is 242  On  review of systems, pt reports eating well, moving his bowels well, staying active, and denies nausea, abdominal pains, leg swelling, and any other symptoms.   MEDICAL HISTORY:  Past Medical History:  Diagnosis Date  . Hypertension     SURGICAL HISTORY: No past surgical history on file.  SOCIAL HISTORY: Social History   Socioeconomic History  . Marital status: Married    Spouse name: Not on file  . Number of children: Not on file  . Years of education: Not on file  . Highest  education level: Not on file  Occupational History  . Not on file  Social Needs  . Financial resource strain: Not on file  . Food insecurity:    Worry: Not on file    Inability: Not on file  . Transportation needs:    Medical: Not on file    Non-medical: Not on file  Tobacco Use  . Smoking status: Never Smoker  . Smokeless tobacco: Never Used  Substance and Sexual Activity  . Alcohol use: Not Currently  . Drug use: Never  . Sexual activity: Not on file  Lifestyle  . Physical activity:    Days per week: Not on file    Minutes per session: Not on file  . Stress: Not on file  Relationships  . Social connections:    Talks on phone: Not on file    Gets together: Not on file    Attends religious service: Not on file    Active member of club or organization: Not on file    Attends meetings of clubs or organizations: Not on file    Relationship status: Not on file  . Intimate partner violence:    Fear of current or ex partner: Not on file    Emotionally abused: Not on file    Physically abused: Not on file    Forced sexual activity: Not on file  Other Topics Concern  . Not on file  Social History Narrative  . Not on file    FAMILY HISTORY: No family history on file.  ALLERGIES:  has No Known Allergies.  MEDICATIONS:  Current Outpatient Medications  Medication Sig Dispense Refill  . acyclovir (ZOVIRAX) 400 MG tablet Take 1 tablet (400 mg total) by mouth daily. 30 tablet 3  . dexamethasone (DECADRON) 4 MG tablet Take 2 tablets (8 mg total) by mouth daily. Start the day after bendamustine chemotherapy for 2 days. Take with food. 30 tablet 1  . fenofibrate micronized (LOFIBRA) 67 MG capsule Take 67 mg by mouth daily before breakfast.    . hydrOXYzine (ATARAX/VISTARIL) 25 MG tablet Take 1 tablet (25 mg total) by mouth 3 (three) times daily as needed for anxiety or nausea. 30 tablet 0  . lisinopril (PRINIVIL,ZESTRIL) 10 MG tablet Take 10 mg by mouth daily.    . ondansetron  (ZOFRAN) 8 MG tablet Take 1 tablet (8 mg total) by mouth 2 (two) times daily as needed for refractory nausea / vomiting. Start on day 2 after bendamustine chemo. 30 tablet 1  . prochlorperazine (COMPAZINE) 10 MG tablet Take 1 tablet (10 mg total) by mouth every 6 (six) hours as needed (Nausea or vomiting). 30 tablet 1  . sertraline (ZOLOFT) 100 MG tablet Take 100 mg by mouth daily.     No current facility-administered medications for this visit.     REVIEW OF SYSTEMS:    A 10+ POINT REVIEW OF SYSTEMS WAS OBTAINED including neurology, dermatology, psychiatry, cardiac, respiratory, lymph, extremities, GI, GU, Musculoskeletal,  constitutional, breasts, reproductive, HEENT.  All pertinent positives are noted in the HPI.  All others are negative.   PHYSICAL EXAMINATION: ECOG PERFORMANCE STATUS: 1 - Symptomatic but completely ambulatory  Vitals:   06/07/18 0928  BP: 110/77  Pulse: 95  Resp: (!) 26  Temp: 97.9 F (36.6 C)  SpO2: 97%   Filed Weights   06/07/18 0928  Weight: 289 lb 1.6 oz (131.1 kg)   .Body mass index is 43.96 kg/m.   GENERAL:alert, in no acute distress and comfortable SKIN: no acute rashes, no significant lesions EYES: conjunctiva are pink and non-injected, sclera anicteric OROPHARYNX: MMM, no exudates, no oropharyngeal erythema or ulceration NECK: supple, no JVD LYMPH:  no palpable lymphadenopathy in the axillary or inguinal regions LUNGS: clear to auscultation b/l with normal respiratory effort HEART: regular rate & rhythm ABDOMEN:  normoactive bowel sounds , non tender, not distended. No palpable hepatosplenomegaly.  Extremity: no pedal edema PSYCH: alert & oriented x 3 with fluent speech NEURO: no focal motor/sensory deficits   LABORATORY DATA:  I have reviewed the data as listed  . CBC Latest Ref Rng & Units 06/07/2018 04/19/2018 03/22/2018  WBC 4.0 - 10.5 K/uL 3.3(L) 3.9(L) 3.8(L)  Hemoglobin 13.0 - 17.0 g/dL 15.4 16.6 16.6  Hematocrit 39.0 - 52.0 % 43.7  46.7 48.2  Platelets 150 - 400 K/uL 172 153 149    . CMP Latest Ref Rng & Units 06/07/2018 04/19/2018 03/22/2018  Glucose 70 - 99 mg/dL 117(H) 133(H) 111(H)  BUN 6 - 20 mg/dL '11 8 8  ' Creatinine 0.61 - 1.24 mg/dL 0.82 0.76 0.73  Sodium 135 - 145 mmol/L 142 141 141  Potassium 3.5 - 5.1 mmol/L 4.2 3.9 4.2  Chloride 98 - 111 mmol/L 109 106 108  CO2 22 - 32 mmol/L '24 24 23  ' Calcium 8.9 - 10.3 mg/dL 9.6 9.7 9.5  Total Protein 6.5 - 8.1 g/dL 6.4(L) 6.8 6.8  Total Bilirubin 0.3 - 1.2 mg/dL 0.6 0.9 0.7  Alkaline Phos 38 - 126 U/L 79 83 74  AST 15 - 41 U/L 29 40 34  ALT 0 - 44 U/L 31 48(H) 41   Component     Latest Ref Rng & Units 10/30/2017  HIV Screen 4th Generation wRfx     Non Reactive Non Reactive  HCV Ab     0.0 - 0.9 s/co ratio <0.1  Hepatitis B Surface Ag     Negative Negative  Hep B Core Ab, Tot     Negative Negative      10/25/17 Tissue Flow Cytometry:    10/25/17 Needle/core Bx:   FISH Oncology  Order: [id number]  Status:  Final result Visible to patient:  No (Not Released) Next appt:  11/07/2017 at 09:00 AM in Radiology ([facility]) Dx:  Lymphocytosis  Component [id number]  Specimen Type Comment:   Comment: BLOOD  Cells Counted 200   Cells Analyzed 200   FISH Result Comment:   Comment: NORMAL: NO CYCLIN D1 OR IGH GENE REARRANGEMENT OBSERVED  Interpretation Comment:   Comment: (NOTE)        nuc ish 11q13(CCND1x2),14q32(IGHx2)[200]    The fluorescence in situ hybridization (FISH) result  was normal. Dual color dual fusion DNA FISH probes targeting  the cyclin D1 gene (CCND1 or BCL1)([employer] and the  heavy chain immunoglobulin gene (IgH) at 14q32 showed two  hybridization signals each with no fusion in all cells  examined. Thus, there was NO apparent rearrangement of the  primary genes involved with  mantle cell lymphoma, and to a  lesser extent myeloma. Marland Kitchen         RADIOGRAPHIC STUDIES: I have personally reviewed the radiological images as  listed and agreed with the findings in the report. Ct Chest W Contrast  Result Date: 06/05/2018 CLINICAL DATA:  Small lymphocytic lymphoma. EXAM: CT CHEST, ABDOMEN, AND PELVIS WITH CONTRAST TECHNIQUE: Multidetector CT imaging of the chest, abdomen and pelvis was performed following the standard protocol during bolus administration of intravenous contrast. CONTRAST:  129m OMNIPAQUE IOHEXOL 300 MG/ML  SOLN COMPARISON:  02/26/2018 FINDINGS: CT CHEST FINDINGS Cardiovascular: Heart size upper normal. No pericardial effusion. Main pulmonary arteries appear enlarged. Mediastinum/Nodes: No mediastinal lymphadenopathy. There is no hilar lymphadenopathy. The esophagus has normal imaging features. There is no axillary lymphadenopathy. Lungs/Pleura: The central tracheobronchial airways are patent. Assessment a lung parenchyma limited by patient breathing motion throughout image acquisition. Areas of mosaic attenuation likely reflect underlying air trapping. No substantial pleural effusion. Musculoskeletal: No worrisome lytic or sclerotic osseous abnormality. CT ABDOMEN PELVIS FINDINGS Hepatobiliary: The liver shows diffusely decreased attenuation suggesting steatosis. 11 mm hypervascular focus medial segment left liver (54/2) is stable. Subtle nodularity of liver contour raises the question of cirrhosis. There is no evidence for gallstones, gallbladder wall thickening, or pericholecystic fluid. No intrahepatic or extrahepatic biliary dilation. Pancreas: No focal mass lesion. No dilatation of the main duct. No intraparenchymal cyst. No peripancreatic edema. Spleen: No splenomegaly. No focal mass lesion. Adrenals/Urinary Tract: No adrenal nodule or mass. Kidneys are unremarkable. No evidence for hydroureter. The urinary bladder appears normal for the degree of distention. Stomach/Bowel: Stomach is nondistended. No gastric wall thickening. No evidence of outlet obstruction. Duodenum is normally positioned as is the ligament of  Treitz. No small bowel wall thickening. No small bowel dilatation. The terminal ileum is normal. The appendix is normal. No gross colonic mass. No colonic wall thickening. Vascular/Lymphatic: No abdominal aortic aneurysm. No abdominal aortic atherosclerotic calcification. -Dominant portal caval lymph node measured previously at 6.8 x 4.4 cm has decreased in the interval, now measuring 5.2 x 2.2 cm (58/2). -Celiac axis node measured previously at 15 mm short axis is now 9 mm. -Index retroperitoneal node anterior to the aorta measured previously at 9 mm short axis is now 6 mm (65/2). -Ileocecal mesentery lymph node measured previously at 6 mm is stable at 6 mm. -index left external iliac node measured previously at 13 mm is now 8 mm short axis (113/2). -similar decrease in other small bilateral pelvic sidewall lymph nodes. No new or progressive lymphadenopathy in the abdomen or pelvis. Reproductive: The prostate gland and seminal vesicles have normal imaging features. Other: No intraperitoneal free fluid. Musculoskeletal: Small left groin hernia contains only fat. No worrisome lytic or sclerotic osseous abnormality. IMPRESSION: 1. Stable CT chest without thoracic lymphadenopathy. Continued further decrease in abdominal and pelvic lymphadenopathy. No new or progressive lymphadenopathy on today's exam. 2. Hepatic steatosis. Subtle nodularity of liver contour raises the question of cirrhosis. 3. Pulmonary arterial enlargement. Pulmonary arterial hypertension a distinct concern. Electronically Signed   By: EMisty StanleyM.D.   On: 06/05/2018 07:41   Ct Abdomen Pelvis W Contrast  Result Date: 06/05/2018 CLINICAL DATA:  Small lymphocytic lymphoma. EXAM: CT CHEST, ABDOMEN, AND PELVIS WITH CONTRAST TECHNIQUE: Multidetector CT imaging of the chest, abdomen and pelvis was performed following the standard protocol during bolus administration of intravenous contrast. CONTRAST:  1043mOMNIPAQUE IOHEXOL 300 MG/ML  SOLN  COMPARISON:  02/26/2018 FINDINGS: CT CHEST FINDINGS Cardiovascular: Heart size upper  normal. No pericardial effusion. Main pulmonary arteries appear enlarged. Mediastinum/Nodes: No mediastinal lymphadenopathy. There is no hilar lymphadenopathy. The esophagus has normal imaging features. There is no axillary lymphadenopathy. Lungs/Pleura: The central tracheobronchial airways are patent. Assessment a lung parenchyma limited by patient breathing motion throughout image acquisition. Areas of mosaic attenuation likely reflect underlying air trapping. No substantial pleural effusion. Musculoskeletal: No worrisome lytic or sclerotic osseous abnormality. CT ABDOMEN PELVIS FINDINGS Hepatobiliary: The liver shows diffusely decreased attenuation suggesting steatosis. 11 mm hypervascular focus medial segment left liver (54/2) is stable. Subtle nodularity of liver contour raises the question of cirrhosis. There is no evidence for gallstones, gallbladder wall thickening, or pericholecystic fluid. No intrahepatic or extrahepatic biliary dilation. Pancreas: No focal mass lesion. No dilatation of the main duct. No intraparenchymal cyst. No peripancreatic edema. Spleen: No splenomegaly. No focal mass lesion. Adrenals/Urinary Tract: No adrenal nodule or mass. Kidneys are unremarkable. No evidence for hydroureter. The urinary bladder appears normal for the degree of distention. Stomach/Bowel: Stomach is nondistended. No gastric wall thickening. No evidence of outlet obstruction. Duodenum is normally positioned as is the ligament of Treitz. No small bowel wall thickening. No small bowel dilatation. The terminal ileum is normal. The appendix is normal. No gross colonic mass. No colonic wall thickening. Vascular/Lymphatic: No abdominal aortic aneurysm. No abdominal aortic atherosclerotic calcification. -Dominant portal caval lymph node measured previously at 6.8 x 4.4 cm has decreased in the interval, now measuring 5.2 x 2.2 cm (58/2).  -Celiac axis node measured previously at 15 mm short axis is now 9 mm. -Index retroperitoneal node anterior to the aorta measured previously at 9 mm short axis is now 6 mm (65/2). -Ileocecal mesentery lymph node measured previously at 6 mm is stable at 6 mm. -index left external iliac node measured previously at 13 mm is now 8 mm short axis (113/2). -similar decrease in other small bilateral pelvic sidewall lymph nodes. No new or progressive lymphadenopathy in the abdomen or pelvis. Reproductive: The prostate gland and seminal vesicles have normal imaging features. Other: No intraperitoneal free fluid. Musculoskeletal: Small left groin hernia contains only fat. No worrisome lytic or sclerotic osseous abnormality. IMPRESSION: 1. Stable CT chest without thoracic lymphadenopathy. Continued further decrease in abdominal and pelvic lymphadenopathy. No new or progressive lymphadenopathy on today's exam. 2. Hepatic steatosis. Subtle nodularity of liver contour raises the question of cirrhosis. 3. Pulmonary arterial enlargement. Pulmonary arterial hypertension a distinct concern. Electronically Signed   By: Misty Stanley M.D.   On: 06/05/2018 07:41    ASSESSMENT & PLAN:   37 y.o. male with  1. 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  -Reviewed 10/11/17 CT of C/A/P which revealed lymphadenopathy throughout the chest, abdomen and pelvis. Most notably in the axilla and abdomen.   -Noted 13q deletion: 13q14.3 is at 47.67% for normal nuclei, and 49.70% with mono allelic deletion of [id number].  -ATM for 11q22.3 revealed 81.67% with normal nuclei and 18.33% with positive nuclei for del 11q22.3 ---> point more to CLL/SLL   02/26/18 CT C/A/P revealed  Marked response to therapy as  evidenced by decrease in size or resolution of adenopathy throughout chest, abdomen and pelvis. 2. Hepatic steatosis. Slight marginal irregularity suggests cirrhosis. 3. Enlarged pulmonary arteries, indicative of pulmonary arterial hypertension.   S/p 6 cycles of  BR completed on 04/19/18  2. Rituxan infusion reaction-  -Had significant flushing with rituxan needing steroids - will hold off rapid infusion protocol.  PLAN: -Discussed pt labwork today, 06/07/18; blood counts and chemistries are stable. LDH is pending.  -Discussed the 06/04/18 CT C/A/P which revealed Stable CT chest without thoracic lymphadenopathy. Continued further decrease in abdominal and pelvic lymphadenopathy. No new or progressive lymphadenopathy on today's exam. 2. Hepatic steatosis. Subtle nodularity of liver contour raises the question of cirrhosis. 3. Pulmonary arterial enlargement. Pulmonary arterial hypertension a distinct concern.  -Recommend diet changes and increased physical activity due to hepatic steatosis and concerns of progression over time to cirrhosis. The pt verbalized understanding and endorses a desire to effect these changes.  -Discussed that the patient has responded very well to treatment and that we will continue watching him clinically and his labs regularly. He will monitor for constitutional symptoms.  -Advised that the patient receive his annual flu vaccine and both pneumonia vaccines every 5 years -Will give the flu shot in clinic today  -Will provide Prevnar at next visit, and Pneumovax at following visit -Will see the pt back in 3 months   3. Likely sleep apnea -Advised that the pt be evaluated for sleep apnea, as he snores a lot and has enlarged pulmonary arteries -Pt has scheduled a sleep study with PCP    Flu shot today RTC with dr Irene Limbo with labs in 3 months Prevnar vaccination in 3 months on followup    All of the patients questions were answered with apparent satisfaction. The patient  knows to call the clinic with any problems, questions or concerns.  The total time spent in the appt was 25 minutes and more than 50% was on counseling and direct patient cares.    Sullivan Lone MD MS AAHIVMS [facility] Hematology/Oncology Physician [facility]  (Office):       [phone] (Work cell):  [phone] (Fax):           [phone]  06/07/2018 10:06 AM   I, Baldwin Jamaica, am acting as a scribe for Dr. Sullivan Lone.   .I have reviewed the above documentation for accuracy and completeness, and I agree with the above. Brunetta Genera MD

## 2018-06-07 ENCOUNTER — Inpatient Hospital Stay: Payer: Managed Care, Other (non HMO)

## 2018-06-07 ENCOUNTER — Telehealth: Payer: Self-pay | Admitting: Hematology

## 2018-06-07 ENCOUNTER — Inpatient Hospital Stay: Payer: Managed Care, Other (non HMO) | Attending: Hematology | Admitting: Hematology

## 2018-06-07 VITALS — BP 110/77 | HR 95 | Temp 97.9°F | Resp 26 | Ht 68.0 in | Wt 289.1 lb

## 2018-06-07 DIAGNOSIS — Z9221 Personal history of antineoplastic chemotherapy: Secondary | ICD-10-CM

## 2018-06-07 DIAGNOSIS — I10 Essential (primary) hypertension: Secondary | ICD-10-CM

## 2018-06-07 DIAGNOSIS — Z79899 Other long term (current) drug therapy: Secondary | ICD-10-CM

## 2018-06-07 DIAGNOSIS — C8308 Small cell B-cell lymphoma, lymph nodes of multiple sites: Secondary | ICD-10-CM | POA: Diagnosis present

## 2018-06-07 DIAGNOSIS — Z23 Encounter for immunization: Secondary | ICD-10-CM | POA: Diagnosis not present

## 2018-06-07 LAB — CMP (CANCER CENTER ONLY)
ALBUMIN: 3.9 g/dL (ref 3.5–5.0)
ALT: 31 U/L (ref 0–44)
ANION GAP: 9 (ref 5–15)
AST: 29 U/L (ref 15–41)
Alkaline Phosphatase: 79 U/L (ref 38–126)
BUN: 11 mg/dL (ref 6–20)
CALCIUM: 9.6 mg/dL (ref 8.9–10.3)
CO2: 24 mmol/L (ref 22–32)
Chloride: 109 mmol/L (ref 98–111)
Creatinine: 0.82 mg/dL (ref 0.61–1.24)
GFR, Est AFR Am: >60 mL/min (ref >60)
GFR, Estimated: >60 mL/min (ref >60)
Glucose, Bld: 117 mg/dL — ABNORMAL HIGH (ref 70–99)
Potassium: 4.2 mmol/L (ref 3.5–5.1)
Sodium: 142 mmol/L (ref 135–145)
Total Bilirubin: 0.6 mg/dL (ref 0.3–1.2)
Total Protein: 6.4 g/dL — ABNORMAL LOW (ref 6.5–8.1)

## 2018-06-07 LAB — CBC WITH DIFFERENTIAL/PLATELET
Abs Immature Granulocytes: 0.1 10*3/uL — ABNORMAL HIGH (ref 0.00–0.07)
Basophils Absolute: 0 10*3/uL (ref 0.0–0.1)
Basophils Relative: 1 %
Eosinophils Absolute: 0.3 10*3/uL (ref 0.0–0.5)
Eosinophils Relative: 10 %
HCT: 43.7 % (ref 39.0–52.0)
Hemoglobin: 15.4 g/dL (ref 13.0–17.0)
Immature Granulocytes: 3 %
Lymphocytes Relative: 13 %
Lymphs Abs: 0.4 10*3/uL — ABNORMAL LOW (ref 0.7–4.0)
MCH: 31.1 pg (ref 26.0–34.0)
MCHC: 35.2 g/dL (ref 30.0–36.0)
MCV: 88.3 fL (ref 80.0–100.0)
MONOS PCT: 19 %
Monocytes Absolute: 0.6 10*3/uL (ref 0.1–1.0)
NEUTROS PCT: 54 %
Neutro Abs: 1.8 10*3/uL (ref 1.7–7.7)
Platelets: 172 10*3/uL (ref 150–400)
RBC: 4.95 MIL/uL (ref 4.22–5.81)
RDW: 12.9 % (ref 11.5–15.5)
WBC: 3.3 10*3/uL — ABNORMAL LOW (ref 4.0–10.5)
nRBC: 0 % (ref 0.0–0.2)

## 2018-06-07 LAB — LACTATE DEHYDROGENASE: LDH: 242 U/L — ABNORMAL HIGH (ref 98–192)

## 2018-06-07 MED ORDER — INFLUENZA VAC SPLIT QUAD 0.5 ML IM SUSY
PREFILLED_SYRINGE | INTRAMUSCULAR | Status: AC
  Filled 2018-06-07: qty 0.5

## 2018-06-07 MED ORDER — INFLUENZA VAC SPLIT QUAD 0.5 ML IM SUSY
0.5000 mL | PREFILLED_SYRINGE | Freq: Once | INTRAMUSCULAR | Status: AC
  Administered 2018-06-07: 0.5 mL via INTRAMUSCULAR

## 2018-06-07 NOTE — Telephone Encounter (Signed)
Printed calendar and avs. °

## 2018-06-13 ENCOUNTER — Ambulatory Visit (INDEPENDENT_AMBULATORY_CARE_PROVIDER_SITE_OTHER): Payer: Managed Care, Other (non HMO) | Admitting: Neurology

## 2018-06-13 DIAGNOSIS — C9110 Chronic lymphocytic leukemia of B-cell type not having achieved remission: Secondary | ICD-10-CM

## 2018-06-13 DIAGNOSIS — G479 Sleep disorder, unspecified: Secondary | ICD-10-CM

## 2018-06-13 DIAGNOSIS — G4719 Other hypersomnia: Secondary | ICD-10-CM

## 2018-06-13 DIAGNOSIS — G4733 Obstructive sleep apnea (adult) (pediatric): Secondary | ICD-10-CM | POA: Diagnosis not present

## 2018-06-13 DIAGNOSIS — R258 Other abnormal involuntary movements: Secondary | ICD-10-CM

## 2018-06-13 DIAGNOSIS — R0683 Snoring: Secondary | ICD-10-CM

## 2018-06-13 DIAGNOSIS — R351 Nocturia: Secondary | ICD-10-CM

## 2018-06-13 DIAGNOSIS — Z6841 Body Mass Index (BMI) 40.0 and over, adult: Secondary | ICD-10-CM

## 2018-06-14 ENCOUNTER — Ambulatory Visit: Payer: Managed Care, Other (non HMO)

## 2018-06-14 ENCOUNTER — Other Ambulatory Visit: Payer: Managed Care, Other (non HMO)

## 2018-06-14 ENCOUNTER — Ambulatory Visit: Payer: Managed Care, Other (non HMO) | Admitting: Hematology

## 2018-06-15 ENCOUNTER — Ambulatory Visit: Payer: Managed Care, Other (non HMO)

## 2018-06-28 ENCOUNTER — Telehealth: Payer: Self-pay

## 2018-06-28 NOTE — Progress Notes (Signed)
Patient referred by Dr. Ernie Hew, seen by me on 05/02/18, HST on 06/13/18.    Please call and notify the patient that the recent home sleep test showed obstructive sleep apnea in the moderate range. While I recommend treatment for this in the form CPAP, his insurance will not approve a sleep study for this. They will likely only approve a trial of autoPAP, which means, that we don't have to bring him in for a sleep study with CPAP, but will let him try an autoPAP machine at home, through a DME company (of his choice, or as per insurance requirement). The DME representative will educate him on how to use the machine, how to put the mask on, etc. I have placed an order in the chart. Please send referral, talk to patient, send report to referring MD. We will need a FU in sleep clinic for 10 weeks post-PAP set up, please arrange that with me or one of our NPs. Thanks,   Star Age, MD, PhD [facility])

## 2018-06-28 NOTE — Addendum Note (Signed)
Addended by: Star Age on: 06/28/2018 02:38 PM   Modules accepted: Orders

## 2018-06-28 NOTE — Procedures (Signed)
[facility] [address] [place], [place] [address] NAME:   Jordan Schroeder                                                                          DOB: August 03, 1980 MEDICAL RECORD no:  [id number]                                                       DOS: 06/13/2018  REFERRING PHYSICIAN: Rachell Cipro, MD STUDY PERFORMED: Home Sleep Test on Watch Pat HISTORY: 37 year old man with a history of hypertension, hyperlipidemia, history of CLL (NHL) and morbid obesity, who reports snoring and excessive daytime somnolence. His Epworth sleepiness score is 8 out of 24, BMI of 42.8. STUDY RESULTS:   Total Recording Time: 9 hrs, 7 mins; Total Sleep Time:  6 hrs, 19  mins Total Apnea/Hypopnea Index (AHI): 28.5/h; RDI: 32.2/h; REM AHI: 30.6/h Average Oxygen Saturation:  95%; Lowest Oxygen Desaturation: 86%  Total Time Oxygen Saturation Below or at 88%:  0.4 minutes  Average Heart Rate: 74   bpm (between 43 and 114 bpm) IMPRESSION: OSA RECOMMENDATION: This home sleep test demonstrates overall moderate obstructive sleep apnea with a total AHI of 28.5/hour and O2 nadir of 86%. Given the patient's medical history and sleep related complaints, treatment with positive airway pressure (in the form of CPAP) is recommended. This will require a full night CPAP titration study for proper treatment settings, O2 monitoring and mask fitting. Based on the moderate severity of his sleep disordered breathing, an attended titration study is indicated. However, patient's insurance has denied an attended sleep study; therefore, the patient will be advised to proceed with an autoPAP titration/trial at home for now. Please note, that untreated obstructive sleep apnea may carry additional perioperative morbidity. Patients with significant obstructive sleep apnea should receive perioperative PAP therapy and the surgeons and particularly the anesthesiologist should be informed of the diagnosis and the  severity of the sleep disordered breathing. The patient should be cautioned not to drive, work at heights, or operate dangerous or heavy equipment when tired or sleepy. Review and reiteration of good sleep hygiene measures should be pursued with any patient. Other causes of the patient's symptoms, including circadian rhythm disturbances, an underlying mood disorder, medication effect and/or an underlying medical problem cannot be ruled out based on this test. Clinical correlation is recommended. The patient and his referring provider will be notified of the test results. The patient will be seen in follow up in sleep clinic at [facility]. I certify that I have reviewed the raw data recording prior to the issuance of this report in accordance with the standards of the American Academy of Sleep Medicine (AASM).  Star Age, MD, PhD [facility]) Diplomat, ABPN (Neurology and Sleep)

## 2018-06-28 NOTE — Telephone Encounter (Signed)
-----   Message from Star Age, MD sent at 06/28/2018  2:38 PM EST ----- Patient referred by Dr. Ernie Hew, seen by me on 05/02/18, HST on 06/13/18.    Please call and notify the patient that the recent home sleep test showed obstructive sleep apnea in the moderate range. While I recommend treatment for this in the form CPAP, his insurance will not approve a sleep study for this. They will likely only approve a trial of autoPAP, which means, that we don't have to bring him in for a sleep study with CPAP, but will let him try an autoPAP machine at home, through a DME company (of his choice, or as per insurance requirement). The DME representative will educate him on how to use the machine, how to put the mask on, etc. I have placed an order in the chart. Please send referral, talk to patient, send report to referring MD. We will need a FU in sleep clinic for 10 weeks post-PAP set up, please arrange that with me or one of our NPs. Thanks,   Star Age, MD, PhD [facility])

## 2018-06-28 NOTE — Telephone Encounter (Signed)
I called pt. I advised pt that Dr. Rexene Alberts reviewed their sleep study results and found that pt has moderate osa. Dr. Rexene Alberts recommends that pt start an auto pap at home. I reviewed PAP compliance expectations with the pt. Pt is agreeable to starting an auto-PAP. I advised pt that an order will be sent to a DME, [facility], and [facility] will call the pt within about one week after they file with the pt's insurance. [facility] will show the pt how to use the machine, fit for masks, and troubleshoot the auto-PAP if needed. A follow up appt was made for insurance purposes with Hoyle Sauer, NP on 09/11/18 at 3:15pm. Pt verbalized understanding to arrive 15 minutes early and bring their auto-PAP. A letter with all of this information in it will be mailed to the pt as a reminder. I verified with the pt that the address we have on file is correct. Pt verbalized understanding of results. Pt had no questions at this time but was encouraged to call back if questions arise. I have sent the order to [facility] and have received confirmation that they have received the order.

## 2018-09-05 NOTE — Progress Notes (Signed)
HEMATOLOGY/ONCOLOGY CLINIC NOTE  Date of Service: 09/06/18  Patient Care Team: [name], MD as PCP - General (Family Medicine)  CHIEF COMPLAINTS/PURPOSE OF CONSULTATION:  F/u for mx of CLL/SLL  HISTORY OF PRESENTING ILLNESS:   Jordan Schroeder is a wonderful 38 y.o. 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 He is accompanied today by his mother. The pt reports that he is doing well overall.   The pt reports that he has not had any significant medical problems in the past. He notes that on an annual physical, his PCP noticed his WBC was high, and after repeat blood tests, he was referred to Dr. Nicholas Lose. Dr. Lindi Adie has already begun the initial work ups as noted below. The pt notes that he does not notice any change in his energy levels or feeling any different over the last 6 months.   He notes that he has some concerns for sleep apnea as he snores a lot at night but does not wake up due to SOB.   Of note prior to the patient's visit today, pt has had CT C/A/P completed on 10/11/17 with results revealing Lymphadenopathy throughout the chest, abdomen, and pelvis.   On 09/21/17 the pt also had a Flow Cytometry revealing MONOCLONAL B-CELL POPULATION IDENTIFIED.  Most recent lab results (09/21/17) of CBC  is as follows: all values are WNL except for WBC at 23.4k, Lymphs Abs at 15.9k, Monocytes Abs at 1.1k. LDH 09/21/17 was WNL at 214. FISH 09/21/12 is pending.  [facility] 10/05/17 revealed CEP 12 at 100%,  13q14.3 is at 47.67% for normal nuclei, and 18.33% with mono allelic deletion of [id number]. CL TP53 for 17p13 revealed 100% with normal nuclei.  ATM for 11q22.3 revealed 81.67% with normal nuclei and 18.33% with positive nuclei for del 11q22.3  On review of systems, pt reports some lower abdominal pain, good energy levels, and denies fevers, chills, night sweats, weight loss, diarrhea, blood in the stools,  black stools, problems passing urine, and any other symptoms.   On PMHx the pt reports HTN. . On Social Hx the pt denies smoking at any point, any ETOH consumption, and illicit drug use.  Interval History:  Jordan Schroeder returns today for management and evaluation of his CLL/SLL after completing 6 planned cycles of BR treatment. The patient's last visit with [place] was on 06/07/18. The pt reports that he is doing well overall.   The pt reports that he has not developed any new concerns in the interim. He denies concerns for infections at this time. He endorses good energy levels, notes that he is eating well and remains active at work. He denies back pains, constitutional symptoms, and noticing any new lumps or bumps.   The pt notes that he started using a CPAP every night in the interim and is less sleepy during the day now.   Lab results today (09/06/18) of CBC w/diff and CMP is as follows: all values are WNL except for WBC at 2.5k, ANC at 1.2k, Lymphs abs at 400. 09/06/18 LDH at 175  On review of systems, pt reports good energy levels, eating well, staying active, and denies abdominal pains, back pains, noticing any new lumps or bumps, concerns for infections, and any other symptoms.  MEDICAL HISTORY:  Past Medical History:  Diagnosis Date  . Hypertension     SURGICAL HISTORY: No past surgical history on file.  SOCIAL  HISTORY: Social History   Socioeconomic History  . Marital status: Married    Spouse name: Not on file  . Number of children: Not on file  . Years of education: Not on file  . Highest education level: Not on file  Occupational History  . Not on file  Social Needs  . Financial resource strain: Not on file  . Food insecurity:    Worry: Not on file    Inability: Not on file  . Transportation needs:    Medical: Not on file    Non-medical: Not on file  Tobacco Use  . Smoking status: Never Smoker  . Smokeless tobacco: Never Used  Substance and Sexual Activity  .  Alcohol use: Not Currently  . Drug use: Never  . Sexual activity: Not on file  Lifestyle  . Physical activity:    Days per week: Not on file    Minutes per session: Not on file  . Stress: Not on file  Relationships  . Social connections:    Talks on phone: Not on file    Gets together: Not on file    Attends religious service: Not on file    Active member of club or organization: Not on file    Attends meetings of clubs or organizations: Not on file    Relationship status: Not on file  . Intimate partner violence:    Fear of current or ex partner: Not on file    Emotionally abused: Not on file    Physically abused: Not on file    Forced sexual activity: Not on file  Other Topics Concern  . Not on file  Social History Narrative  . Not on file    FAMILY HISTORY: No family history on file.  ALLERGIES:  has No Known Allergies.  MEDICATIONS:  Current Outpatient Medications  Medication Sig Dispense Refill  . acyclovir (ZOVIRAX) 400 MG tablet Take 1 tablet (400 mg total) by mouth daily. 30 tablet 3  . dexamethasone (DECADRON) 4 MG tablet Take 2 tablets (8 mg total) by mouth daily. Start the day after bendamustine chemotherapy for 2 days. Take with food. 30 tablet 1  . fenofibrate micronized (LOFIBRA) 67 MG capsule Take 67 mg by mouth daily before breakfast.    . hydrOXYzine (ATARAX/VISTARIL) 25 MG tablet Take 1 tablet (25 mg total) by mouth 3 (three) times daily as needed for anxiety or nausea. 30 tablet 0  . lisinopril (PRINIVIL,ZESTRIL) 10 MG tablet Take 10 mg by mouth daily.    . ondansetron (ZOFRAN) 8 MG tablet Take 1 tablet (8 mg total) by mouth 2 (two) times daily as needed for refractory nausea / vomiting. Start on day 2 after bendamustine chemo. 30 tablet 1  . prochlorperazine (COMPAZINE) 10 MG tablet Take 1 tablet (10 mg total) by mouth every 6 (six) hours as needed (Nausea or vomiting). 30 tablet 1  . sertraline (ZOLOFT) 100 MG tablet Take 100 mg by mouth daily.     No  current facility-administered medications for this visit.     REVIEW OF SYSTEMS:    A 10+ POINT REVIEW OF SYSTEMS WAS OBTAINED including neurology, dermatology, psychiatry, cardiac, respiratory, lymph, extremities, GI, GU, Musculoskeletal, constitutional, breasts, reproductive, HEENT.  All pertinent positives are noted in the HPI.  All others are negative.   PHYSICAL EXAMINATION: ECOG PERFORMANCE STATUS: 1 - Symptomatic but completely ambulatory  Vitals:   09/06/18 0947  BP: 98/77  Pulse: 86  Resp: 18  Temp: 98.8 F (37.1 C)  SpO2: 98%   Filed Weights   09/06/18 0947  Weight: 265 lb 11.2 oz (120.5 kg)   .Body mass index is 40.4 kg/m.   GENERAL:alert, in no acute distress and comfortable SKIN: no acute rashes, no significant lesions EYES: conjunctiva are pink and non-injected, sclera anicteric OROPHARYNX: MMM, no exudates, no oropharyngeal erythema or ulceration NECK: supple, no JVD LYMPH:  no palpable lymphadenopathy in the cervical, axillary or inguinal regions LUNGS: clear to auscultation b/l with normal respiratory effort HEART: regular rate & rhythm ABDOMEN:  normoactive bowel sounds , non tender, not distended. No palpable hepatosplenomegaly.  Extremity: no pedal edema PSYCH: alert & oriented x 3 with fluent speech NEURO: no focal motor/sensory deficits   LABORATORY DATA:  I have reviewed the data as listed  . CBC Latest Ref Rng & Units 09/06/2018 06/07/2018 04/19/2018  WBC 4.0 - 10.5 K/uL 2.5(L) 3.3(L) 3.9(L)  Hemoglobin 13.0 - 17.0 g/dL 14.2 15.4 16.6  Hematocrit 39.0 - 52.0 % 43.3 43.7 46.7  Platelets 150 - 400 K/uL 189 172 153    . CMP Latest Ref Rng & Units 09/06/2018 06/07/2018 04/19/2018  Glucose 70 - 99 mg/dL 96 117(H) 133(H)  BUN 6 - 20 mg/dL '18 11 8  ' Creatinine 0.61 - 1.24 mg/dL 0.80 0.82 0.76  Sodium 135 - 145 mmol/L 138 142 141  Potassium 3.5 - 5.1 mmol/L 4.3 4.2 3.9  Chloride 98 - 111 mmol/L 105 109 106  CO2 22 - 32 mmol/L '27 24 24  ' Calcium 8.9 -  10.3 mg/dL 9.5 9.6 9.7  Total Protein 6.5 - 8.1 g/dL 7.1 6.4(L) 6.8  Total Bilirubin 0.3 - 1.2 mg/dL 0.8 0.6 0.9  Alkaline Phos 38 - 126 U/L 62 79 83  AST 15 - 41 U/L 33 29 40  ALT 0 - 44 U/L 36 31 48(H)   Component     Latest Ref Rng & Units 10/30/2017  HIV Screen 4th Generation wRfx     Non Reactive Non Reactive  HCV Ab     0.0 - 0.9 s/co ratio <0.1  Hepatitis B Surface Ag     Negative Negative  Hep B Core Ab, Tot     Negative Negative      10/25/17 Tissue Flow Cytometry:    10/25/17 Needle/core Bx:   FISH Oncology  Order: [id number]  Status:  Final result Visible to patient:  No (Not Released) Next appt:  11/07/2017 at 09:00 AM in Radiology ([facility] PET) Dx:  Lymphocytosis  Component 42moago  Specimen Type Comment:   Comment: BLOOD  Cells Counted 200   Cells Analyzed 200   FISH Result Comment:   Comment: NORMAL: NO CYCLIN D1 OR IGH GENE REARRANGEMENT OBSERVED  Interpretation Comment:   Comment: (NOTE)        nuc ish 11q13(CCND1x2),14q32(IGHx2)[200]    The fluorescence in situ hybridization (FISH) result  was normal. Dual color dual fusion DNA FISH probes targeting  the cyclin D1 gene (CCND1 or BCL1)([employer] and the  heavy chain immunoglobulin gene (IgH) at 14q32 showed two  hybridization signals each with no fusion in all cells  examined. Thus, there was NO apparent rearrangement of the  primary genes involved with mantle cell lymphoma, and to a  lesser extent myeloma. .Marland Kitchen        RADIOGRAPHIC STUDIES: I have personally reviewed the radiological images as listed and agreed with the findings in the report. No results found.  ASSESSMENT & PLAN:   38y.o. male with  1.  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  -Reviewed 10/11/17  CT of C/A/P which revealed lymphadenopathy throughout the chest, abdomen and pelvis. Most notably in the axilla and abdomen.   -Noted 13q deletion: 13q14.3 is at 47.67% for normal nuclei, and 03.75% with mono allelic deletion of [id number].  -ATM for 11q22.3 revealed 81.67% with normal nuclei and 18.33% with positive nuclei for del 11q22.3 ---> point more to CLL/SLL   02/26/18 CT C/A/P revealed  Marked response to therapy as evidenced by decrease in size or resolution of adenopathy throughout chest, abdomen and pelvis. 2. Hepatic steatosis. Slight marginal irregularity suggests cirrhosis. 3. Enlarged pulmonary arteries, indicative of pulmonary arterial hypertension.   S/p 6 cycles of BR completed on 04/19/18  06/04/18 CT C/A/P revealed Stable CT chest without thoracic lymphadenopathy. Continued further decrease in abdominal and pelvic lymphadenopathy. No new or progressive lymphadenopathy on today's exam. 2. Hepatic steatosis. Subtle nodularity of liver contour raises the question of cirrhosis. 3. Pulmonary arterial enlargement. Pulmonary arterial hypertension a distinct concern.  2. H/o Rituxan infusion reaction-  -Had significant flushing with rituxan needing steroids. Not a candidate in future for rapid infusion protocol.  PLAN: -Discussed pt labwork today, 09/06/18; ANC borderline low at 1.2k, HGB normal at 14.2, PLT at 189k. LDH normal at 175. Chemistries are normal. -The pt shows no clinical or lab progression of his CLL at this time.  -No indication for further treatment at this time. -Recommend diet changes and increased physical activity due to hepatic steatosis and concerns of progression over time to cirrhosis. The pt verbalized understanding and endorses a desire to effect these changes.  -Discussed that the patient has responded very well to treatment and that we will continue watching him clinically and his labs regularly. He will monitor for constitutional symptoms.  -Advised that the  patient receive his annual flu vaccine and both pneumonia vaccines every 5 years -Gave flu shot in clinic on 06/07/18 -Will provide Prevnar at next visit in July 2020, and Pneumovax at following visit -Will see the pt back in 4 months  3. Sleep apnea Notes he had a sleep study since his last clinic visit and diagnosis was confirmed. -Pt now uses CPAP   RTC with dr Irene Limbo with labs in 4 months   All of the patients questions were answered with apparent satisfaction. The patient knows to call the clinic with any problems, questions or concerns.  The total time spent in the appt was 20 minutes and more than 50% was on counseling and direct patient cares.     Sullivan Lone MD MS AAHIVMS [facility] Hematology/Oncology Physician [facility]  (Office):       [phone] (Work cell):  [phone] (Fax):           [phone]  09/06/2018 10:33 AM   I, Baldwin Jamaica, am acting as a scribe for Dr. Sullivan Lone.   .I have reviewed the above documentation for accuracy and completeness, and I agree with the above. Brunetta Genera MD

## 2018-09-06 ENCOUNTER — Telehealth: Payer: Self-pay | Admitting: Hematology

## 2018-09-06 ENCOUNTER — Inpatient Hospital Stay: Payer: 59 | Admitting: Hematology

## 2018-09-06 ENCOUNTER — Inpatient Hospital Stay: Payer: 59 | Attending: Hematology

## 2018-09-06 VITALS — BP 98/77 | HR 86 | Temp 98.8°F | Resp 18 | Ht 68.0 in | Wt 265.7 lb

## 2018-09-06 DIAGNOSIS — Z79899 Other long term (current) drug therapy: Secondary | ICD-10-CM

## 2018-09-06 DIAGNOSIS — G4730 Sleep apnea, unspecified: Secondary | ICD-10-CM

## 2018-09-06 DIAGNOSIS — R109 Unspecified abdominal pain: Secondary | ICD-10-CM | POA: Diagnosis not present

## 2018-09-06 DIAGNOSIS — Z23 Encounter for immunization: Secondary | ICD-10-CM

## 2018-09-06 DIAGNOSIS — I10 Essential (primary) hypertension: Secondary | ICD-10-CM | POA: Diagnosis not present

## 2018-09-06 DIAGNOSIS — C8308 Small cell B-cell lymphoma, lymph nodes of multiple sites: Secondary | ICD-10-CM

## 2018-09-06 LAB — CBC WITH DIFFERENTIAL/PLATELET
Abs Immature Granulocytes: 0.04 10*3/uL (ref 0.00–0.07)
BASOS PCT: 1 %
Basophils Absolute: 0 10*3/uL (ref 0.0–0.1)
Eosinophils Absolute: 0.2 10*3/uL (ref 0.0–0.5)
Eosinophils Relative: 10 %
HCT: 43.3 % (ref 39.0–52.0)
Hemoglobin: 14.2 g/dL (ref 13.0–17.0)
Immature Granulocytes: 2 %
Lymphocytes Relative: 17 %
Lymphs Abs: 0.4 10*3/uL — ABNORMAL LOW (ref 0.7–4.0)
MCH: 29.9 pg (ref 26.0–34.0)
MCHC: 32.8 g/dL (ref 30.0–36.0)
MCV: 91.2 fL (ref 80.0–100.0)
MONOS PCT: 24 %
Monocytes Absolute: 0.6 10*3/uL (ref 0.1–1.0)
Neutro Abs: 1.2 10*3/uL — ABNORMAL LOW (ref 1.7–7.7)
Neutrophils Relative %: 46 %
Platelets: 189 10*3/uL (ref 150–400)
RBC: 4.75 MIL/uL (ref 4.22–5.81)
RDW: 12.6 % (ref 11.5–15.5)
WBC: 2.5 10*3/uL — ABNORMAL LOW (ref 4.0–10.5)
nRBC: 0 % (ref 0.0–0.2)

## 2018-09-06 LAB — COMPREHENSIVE METABOLIC PANEL
ALT: 36 U/L (ref 0–44)
AST: 33 U/L (ref 15–41)
Albumin: 4.8 g/dL (ref 3.5–5.0)
Alkaline Phosphatase: 62 U/L (ref 38–126)
Anion gap: 6 (ref 5–15)
BUN: 18 mg/dL (ref 6–20)
CO2: 27 mmol/L (ref 22–32)
Calcium: 9.5 mg/dL (ref 8.9–10.3)
Chloride: 105 mmol/L (ref 98–111)
Creatinine, Ser: 0.8 mg/dL (ref 0.61–1.24)
GFR calc Af Amer: >60 mL/min (ref >60)
GFR calc non Af Amer: >60 mL/min (ref >60)
Glucose, Bld: 96 mg/dL (ref 70–99)
POTASSIUM: 4.3 mmol/L (ref 3.5–5.1)
Sodium: 138 mmol/L (ref 135–145)
Total Bilirubin: 0.8 mg/dL (ref 0.3–1.2)
Total Protein: 7.1 g/dL (ref 6.5–8.1)

## 2018-09-06 LAB — LACTATE DEHYDROGENASE: LDH: 175 U/L (ref 98–192)

## 2018-09-06 NOTE — Patient Instructions (Signed)
Thank you for choosing [facility] to provide your oncology and hematology care.  To afford each patient quality time with our providers, please arrive 30 minutes before your scheduled appointment time.  If you arrive late for your appointment, you may be asked to reschedule.  We strive to give you quality time with our providers, and arriving late affects you and other patients whose appointments are after yours.    If you are a no show for multiple scheduled visits, you may be dismissed from the clinic at the providers discretion.     Again, thank you for choosing [facility], our hope is that these requests will decrease the amount of time that you wait before being seen by our physicians.  ______________________________________________________________________   Should you have questions after your visit to the [facility], please contact our office at [phone] between the hours of 8:30 and 4:30 p.m.    Voicemails left after 4:30p.m will not be returned until the following business day.     For prescription refill requests, please have your pharmacy contact us directly.  Please also try to allow 48 hours for prescription requests.     Please contact the scheduling department for questions regarding scheduling.  For scheduling of procedures such as PET scans, CT scans, MRI, Ultrasound, etc please contact central scheduling at [phone].     Resources For Cancer Patients and Caregivers:    Oncolink.org:  A wonderful resource for patients and healthcare providers for information regarding your disease, ways to tract your treatment, what to expect, etc.      American Cancer Society:  800-227-2345  Can help patients locate various types of support and financial assistance   Cancer Care: 1-800-813-HOPE (4673) Provides financial assistance, online support groups, medication/co-pay assistance.     [place] DSS:  336-641-3447 Where to apply  for food stamps, Medicaid, and utility assistance   Medicare Rights Center: 800-333-4114 Helps people with Medicare understand their rights and benefits, navigate the Medicare system, and secure the quality healthcare they deserve   SCAT: 336-333-6589 [place] [employer]'s shared-ride transportation service for eligible riders who have a disability that prevents them from riding the fixed route bus.     For additional information on assistance programs please contact our social worker:   Abigail Elmore:  [phone]  

## 2018-09-06 NOTE — Telephone Encounter (Signed)
Scheduled appt per 3/5 los. ° °Printed calendar and avs. °

## 2018-09-11 ENCOUNTER — Ambulatory Visit: Payer: Self-pay | Admitting: Nurse Practitioner

## 2019-01-03 NOTE — Progress Notes (Signed)
HEMATOLOGY/ONCOLOGY CLINIC NOTE  Date of Service: 01/07/19  Patient Care Team: [name], MD as PCP - General (Family Medicine)  CHIEF COMPLAINTS/PURPOSE OF CONSULTATION:  F/u for mx of CLL/SLL  HISTORY OF PRESENTING ILLNESS:   Jordan Schroeder is a wonderful 38 y.o. 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 He is accompanied today by his mother. The pt reports that he is doing well overall.   The pt reports that he has not had any significant medical problems in the past. He notes that on an annual physical, his PCP noticed his WBC was high, and after repeat blood tests, he was referred to Dr. Nicholas Lose. Dr. Lindi Adie has already begun the initial work ups as noted below. The pt notes that he does not notice any change in his energy levels or feeling any different over the last 6 months.   He notes that he has some concerns for sleep apnea as he snores a lot at night but does not wake up due to SOB.   Of note prior to the patient's visit today, pt has had CT C/A/P completed on 10/11/17 with results revealing Lymphadenopathy throughout the chest, abdomen, and pelvis.   On 09/21/17 the pt also had a Flow Cytometry revealing MONOCLONAL B-CELL POPULATION IDENTIFIED.  Most recent lab results (09/21/17) of CBC  is as follows: all values are WNL except for WBC at 23.4k, Lymphs Abs at 15.9k, Monocytes Abs at 1.1k. LDH 09/21/17 was WNL at 214. FISH 09/21/12 is pending.  [facility] 10/05/17 revealed CEP 12 at 100%,  13q14.3 is at 47.67% for normal nuclei, and 61.95% with mono allelic deletion of [id number]. CL TP53 for 17p13 revealed 100% with normal nuclei.  ATM for 11q22.3 revealed 81.67% with normal nuclei and 18.33% with positive nuclei for del 11q22.3  On review of systems, pt reports some lower abdominal pain, good energy levels, and denies fevers, chills, night sweats, weight loss, diarrhea, blood in the stools,  black stools, problems passing urine, and any other symptoms.   On PMHx the pt reports HTN. . On Social Hx the pt denies smoking at any point, any ETOH consumption, and illicit drug use.  Interval History:  Jordan Schroeder returns today for management and evaluation of his CLL/SLL after completing 6 planned cycles of BR treatment. The patient's last visit with [place] was on 09/06/18. The pt reports that he is doing well overall.  The pt reports that he has been enjoying good energy levels, and has kept active in the interim. He endorses a good appetite and notes that he is eating well. He denies noticing any new lumps or bumps and denies fevers, chills or night sweats.  He denies leg swelling, abdominal pains, changes in bowel habits and changes in urination habits. He notes that his BP has been stable. He notes that he has been using his CPAP every night.  Lab results today (01/07/19) of CBC w/diff is as follows: all values are WNL except for Eosinophils abs at 1.3k. 01/07/19 LDH and CMP are stable.  On review of systems, pt reports good energy levels, staying active, eating well, and denies CP, SOB, abdominal pains, leg swelling, noticing any new lumps or bumps, fevers, chills, night sweats, concerns for infections, and any other symptoms.   MEDICAL HISTORY:  Past Medical History:  Diagnosis Date  . Hypertension     SURGICAL HISTORY: No past surgical history  on file.  SOCIAL HISTORY: Social History   Socioeconomic History  . Marital status: Married    Spouse name: Not on file  . Number of children: Not on file  . Years of education: Not on file  . Highest education level: Not on file  Occupational History  . Not on file  Social Needs  . Financial resource strain: Not on file  . Food insecurity    Worry: Not on file    Inability: Not on file  . Transportation needs    Medical: Not on file    Non-medical: Not on file  Tobacco Use  . Smoking status: Never Smoker  . Smokeless  tobacco: Never Used  Substance and Sexual Activity  . Alcohol use: Not Currently  . Drug use: Never  . Sexual activity: Not on file  Lifestyle  . Physical activity    Days per week: Not on file    Minutes per session: Not on file  . Stress: Not on file  Relationships  . Social Herbalist on phone: Not on file    Gets together: Not on file    Attends religious service: Not on file    Active member of club or organization: Not on file    Attends meetings of clubs or organizations: Not on file    Relationship status: Not on file  . Intimate partner violence    Fear of current or ex partner: Not on file    Emotionally abused: Not on file    Physically abused: Not on file    Forced sexual activity: Not on file  Other Topics Concern  . Not on file  Social History Narrative  . Not on file    FAMILY HISTORY: No family history on file.  ALLERGIES:  has No Known Allergies.  MEDICATIONS:  Current Outpatient Medications  Medication Sig Dispense Refill  . acyclovir (ZOVIRAX) 400 MG tablet Take 1 tablet (400 mg total) by mouth daily. 30 tablet 3  . dexamethasone (DECADRON) 4 MG tablet Take 2 tablets (8 mg total) by mouth daily. Start the day after bendamustine chemotherapy for 2 days. Take with food. 30 tablet 1  . fenofibrate micronized (LOFIBRA) 67 MG capsule Take 67 mg by mouth daily before breakfast.    . hydrOXYzine (ATARAX/VISTARIL) 25 MG tablet Take 1 tablet (25 mg total) by mouth 3 (three) times daily as needed for anxiety or nausea. 30 tablet 0  . lisinopril (PRINIVIL,ZESTRIL) 10 MG tablet Take 10 mg by mouth daily.    . ondansetron (ZOFRAN) 8 MG tablet Take 1 tablet (8 mg total) by mouth 2 (two) times daily as needed for refractory nausea / vomiting. Start on day 2 after bendamustine chemo. 30 tablet 1  . prochlorperazine (COMPAZINE) 10 MG tablet Take 1 tablet (10 mg total) by mouth every 6 (six) hours as needed (Nausea or vomiting). 30 tablet 1  . sertraline  (ZOLOFT) 100 MG tablet Take 100 mg by mouth daily.     No current facility-administered medications for this visit.     REVIEW OF SYSTEMS:    A 10+ POINT REVIEW OF SYSTEMS WAS OBTAINED including neurology, dermatology, psychiatry, cardiac, respiratory, lymph, extremities, GI, GU, Musculoskeletal, constitutional, breasts, reproductive, HEENT.  All pertinent positives are noted in the HPI.  All others are negative.   PHYSICAL EXAMINATION: ECOG PERFORMANCE STATUS: 1 - Symptomatic but completely ambulatory  Vitals:   01/07/19 1349  BP: 115/69  Pulse: (!) 113  Resp: 18  Temp: 98.5 F (36.9 C)  SpO2: 97%   Filed Weights   01/07/19 1349  Weight: 283 lb 6.4 oz (128.5 kg)   .Body mass index is 43.09 kg/m.   GENERAL:alert, in no acute distress and comfortable SKIN: no acute rashes, no significant lesions EYES: conjunctiva are pink and non-injected, sclera anicteric OROPHARYNX: MMM, no exudates, no oropharyngeal erythema or ulceration NECK: supple, no JVD LYMPH:  no palpable lymphadenopathy in the cervical, axillary or inguinal regions LUNGS: clear to auscultation b/l with normal respiratory effort HEART: regular rate & rhythm ABDOMEN:  normoactive bowel sounds , non tender, not distended. No palpable hepatosplenomegaly.  Extremity: no pedal edema PSYCH: alert & oriented x 3 with fluent speech NEURO: no focal motor/sensory deficits   LABORATORY DATA:  I have reviewed the data as listed  . CBC Latest Ref Rng & Units 01/07/2019 09/06/2018 06/07/2018  WBC 4.0 - 10.5 K/uL 6.2 2.5(L) 3.3(L)  Hemoglobin 13.0 - 17.0 g/dL 14.8 14.2 15.4  Hematocrit 39.0 - 52.0 % 42.5 43.3 43.7  Platelets 150 - 400 K/uL 211 189 172    . CMP Latest Ref Rng & Units 09/06/2018 06/07/2018 04/19/2018  Glucose 70 - 99 mg/dL 96 117(H) 133(H)  BUN 6 - 20 mg/dL '18 11 8  ' Creatinine 0.61 - 1.24 mg/dL 0.80 0.82 0.76  Sodium 135 - 145 mmol/L 138 142 141  Potassium 3.5 - 5.1 mmol/L 4.3 4.2 3.9  Chloride 98 - 111  mmol/L 105 109 106  CO2 22 - 32 mmol/L '27 24 24  ' Calcium 8.9 - 10.3 mg/dL 9.5 9.6 9.7  Total Protein 6.5 - 8.1 g/dL 7.1 6.4(L) 6.8  Total Bilirubin 0.3 - 1.2 mg/dL 0.8 0.6 0.9  Alkaline Phos 38 - 126 U/L 62 79 83  AST 15 - 41 U/L 33 29 40  ALT 0 - 44 U/L 36 31 48(H)   Component     Latest Ref Rng & Units 10/30/2017  HIV Screen 4th Generation wRfx     Non Reactive Non Reactive  HCV Ab     0.0 - 0.9 s/co ratio <0.1  Hepatitis B Surface Ag     Negative Negative  Hep B Core Ab, Tot     Negative Negative      10/25/17 Tissue Flow Cytometry:    10/25/17 Needle/core Bx:   [facility] Oncology  Order: [id number]  Status:  Final result Visible to patient:  No (Not Released) Next appt:  11/07/2017 at 09:00 AM in Radiology ([facility] PET) Dx:  Lymphocytosis  Component [id number]  Specimen Type Comment:   Comment: BLOOD  Cells Counted 200   Cells Analyzed 200   FISH Result Comment:   Comment: NORMAL: NO CYCLIN D1 OR IGH GENE REARRANGEMENT OBSERVED  Interpretation Comment:   Comment: (NOTE)        nuc ish 11q13(CCND1x2),14q32(IGHx2)[200]    The fluorescence in situ hybridization (FISH) result  was normal. Dual color dual fusion DNA FISH probes targeting  the cyclin D1 gene (CCND1 or BCL1)([employer] and the  heavy chain immunoglobulin gene (IgH) at 14q32 showed two  hybridization signals each with no fusion in all cells  examined. Thus, there was NO apparent rearrangement of the  primary genes involved with mantle cell lymphoma, and to a  lesser extent myeloma. .Marland Kitchen        RADIOGRAPHIC STUDIES: I have personally reviewed the radiological images as listed and agreed with the findings in the report. No results found.  ASSESSMENT & PLAN:  38 y.o. male with  1. Non Hodgkins lymphoma/leukemia --  CLL/SLL we did r/o  Mantle Cell Lymphoma  -Labs upon initial presentation from 09/21/17; WBC at 23.4k, Lymphs Abs at 15.9k -Reviewed 09/21/17 Flow Cytometry results with pt which  revealed a CD5+ monoclonal B-Cell population.  -Reviewed 10/11/17 CT of C/A/P which revealed lymphadenopathy throughout the chest, abdomen and pelvis. Most notably in the axilla and abdomen.   -Noted 13q deletion: 13q14.3 is at 47.67% for normal nuclei, and 59.09% with mono allelic deletion of [id number].  -ATM for 11q22.3 revealed 81.67% with normal nuclei and 18.33% with positive nuclei for del 11q22.3 ---> point more to CLL/SLL   02/26/18 CT C/A/P revealed  Marked response to therapy as evidenced by decrease in size or resolution of adenopathy throughout chest, abdomen and pelvis. 2. Hepatic steatosis. Slight marginal irregularity suggests cirrhosis. 3. Enlarged pulmonary arteries, indicative of pulmonary arterial hypertension.   S/p 6 cycles of BR completed on 04/19/18  06/04/18 CT C/A/P revealed Stable CT chest without thoracic lymphadenopathy. Continued further decrease in abdominal and pelvic lymphadenopathy. No new or progressive lymphadenopathy on today's exam. 2. Hepatic steatosis. Subtle nodularity of liver contour raises the question of cirrhosis. 3. Pulmonary arterial enlargement. Pulmonary arterial hypertension a distinct concern.  2. H/o Rituxan infusion reaction-  -Had significant flushing with rituxan needing steroids. Not a candidate in future for rapid infusion protocol.  PLAN: -Discussed pt labwork today, 01/07/19; blood counts improved, chemistries and LDH are pending -The pt shows no clinical or lab progression of his CLL at this time.  -No indication for further treatment at this time.   -Discussed that the patient has responded very well to treatment and that we will continue watching him clinically and his labs regularly. He will monitor for constitutional symptoms.  -Advised that the patient receive his annual flu vaccine and both pneumonia vaccines every 5 years -Gave flu shot in clinic on 06/07/18 -Will provide Prevnar at next visit in October/November 2020, and Pneumovax at  following visit -Recommend diet changes and increased physical activity due to hepatic steatosis and concerns of progression over time to cirrhosis. The pt verbalized understanding and endorses a desire to effect these changes.  -Will see the pt back in 4 months  3. Sleep apnea Notes he had a sleep study since his last clinic visit and diagnosis was confirmed. -Pt now uses CPAP   RTC with dr Irene Limbo with labs in 4 months   All of the patients questions were answered with apparent satisfaction. The patient knows to call the clinic with any problems, questions or concerns.  The total time spent in the appt was 15 minutes and more than 50% was on counseling and direct patient cares.    Sullivan Lone MD MS AAHIVMS [facility] [place] Hematology/Oncology Physician [facility]  (Office):       [phone] (Work cell):  [phone] (Fax):           [phone]  01/07/2019 2:04 PM   I, Baldwin Jamaica, am acting as a scribe for Dr. Sullivan Lone.   .I have reviewed the above documentation for accuracy and completeness, and I agree with the above. Brunetta Genera MD

## 2019-01-07 ENCOUNTER — Telehealth: Payer: Self-pay | Admitting: Hematology

## 2019-01-07 ENCOUNTER — Other Ambulatory Visit: Payer: Self-pay

## 2019-01-07 ENCOUNTER — Inpatient Hospital Stay: Payer: 59 | Admitting: Hematology

## 2019-01-07 ENCOUNTER — Inpatient Hospital Stay: Payer: 59 | Attending: Hematology

## 2019-01-07 VITALS — BP 115/69 | HR 113 | Temp 98.5°F | Resp 18 | Ht 68.0 in | Wt 283.4 lb

## 2019-01-07 DIAGNOSIS — K760 Fatty (change of) liver, not elsewhere classified: Secondary | ICD-10-CM | POA: Insufficient documentation

## 2019-01-07 DIAGNOSIS — Z79899 Other long term (current) drug therapy: Secondary | ICD-10-CM | POA: Diagnosis not present

## 2019-01-07 DIAGNOSIS — G4730 Sleep apnea, unspecified: Secondary | ICD-10-CM

## 2019-01-07 DIAGNOSIS — I10 Essential (primary) hypertension: Secondary | ICD-10-CM

## 2019-01-07 DIAGNOSIS — C8308 Small cell B-cell lymphoma, lymph nodes of multiple sites: Secondary | ICD-10-CM | POA: Diagnosis present

## 2019-01-07 LAB — CMP (CANCER CENTER ONLY)
ALT: 27 U/L (ref 0–44)
AST: 23 U/L (ref 15–41)
Albumin: 4.1 g/dL (ref 3.5–5.0)
Alkaline Phosphatase: 78 U/L (ref 38–126)
Anion gap: 10 (ref 5–15)
BUN: 12 mg/dL (ref 6–20)
CO2: 23 mmol/L (ref 22–32)
Calcium: 9.2 mg/dL (ref 8.9–10.3)
Chloride: 108 mmol/L (ref 98–111)
Creatinine: 0.86 mg/dL (ref 0.61–1.24)
GFR, Est AFR Am: >60 mL/min (ref >60)
GFR, Estimated: >60 mL/min (ref >60)
Glucose, Bld: 143 mg/dL — ABNORMAL HIGH (ref 70–99)
Potassium: 3.9 mmol/L (ref 3.5–5.1)
Sodium: 141 mmol/L (ref 135–145)
Total Bilirubin: 0.5 mg/dL (ref 0.3–1.2)
Total Protein: 7.1 g/dL (ref 6.5–8.1)

## 2019-01-07 LAB — CBC WITH DIFFERENTIAL/PLATELET
Abs Immature Granulocytes: 0.03 10*3/uL (ref 0.00–0.07)
Basophils Absolute: 0 10*3/uL (ref 0.0–0.1)
Basophils Relative: 1 %
Eosinophils Absolute: 1.3 10*3/uL — ABNORMAL HIGH (ref 0.0–0.5)
Eosinophils Relative: 20 %
HCT: 42.5 % (ref 39.0–52.0)
Hemoglobin: 14.8 g/dL (ref 13.0–17.0)
Immature Granulocytes: 1 %
Lymphocytes Relative: 22 %
Lymphs Abs: 1.4 10*3/uL (ref 0.7–4.0)
MCH: 31.2 pg (ref 26.0–34.0)
MCHC: 34.8 g/dL (ref 30.0–36.0)
MCV: 89.7 fL (ref 80.0–100.0)
Monocytes Absolute: 0.5 10*3/uL (ref 0.1–1.0)
Monocytes Relative: 8 %
Neutro Abs: 3 10*3/uL (ref 1.7–7.7)
Neutrophils Relative %: 48 %
Platelets: 211 10*3/uL (ref 150–400)
RBC: 4.74 MIL/uL (ref 4.22–5.81)
RDW: 12.4 % (ref 11.5–15.5)
WBC: 6.2 10*3/uL (ref 4.0–10.5)
nRBC: 0 % (ref 0.0–0.2)

## 2019-01-07 LAB — LACTATE DEHYDROGENASE: LDH: 166 U/L (ref 98–192)

## 2019-01-07 NOTE — Telephone Encounter (Signed)
Gave avs and calendar ° °

## 2019-04-29 ENCOUNTER — Telehealth: Payer: Self-pay | Admitting: Hematology

## 2019-04-29 NOTE — Telephone Encounter (Signed)
[facility] CME. appointments moved from 11/6 to 11/9. Confirmed with patient.

## 2019-05-08 NOTE — Progress Notes (Signed)
HEMATOLOGY/ONCOLOGY CLINIC NOTE  Date of Service: 05/13/19  Patient Care Team: [name], MD as PCP - General (Family Medicine)  CHIEF COMPLAINTS/PURPOSE OF CONSULTATION:  F/u for mx of CLL/SLL  HISTORY OF PRESENTING ILLNESS:   Jordan Schroeder is a wonderful 39 y.o. 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 He is accompanied today by his mother. The pt reports that he is doing well overall.   The pt reports that he has not had any significant medical problems in the past. He notes that on an annual physical, his PCP noticed his WBC was high, and after repeat blood tests, he was referred to Dr. Nicholas Lose. Dr. Lindi Adie has already begun the initial work ups as noted below. The pt notes that he does not notice any change in his energy levels or feeling any different over the last 6 months.   He notes that he has some concerns for sleep apnea as he snores a lot at night but does not wake up due to SOB.   Of note prior to the patient's visit today, pt has had CT C/A/P completed on 10/11/17 with results revealing Lymphadenopathy throughout the chest, abdomen, and pelvis.   On 09/21/17 the pt also had a Flow Cytometry revealing MONOCLONAL B-CELL POPULATION IDENTIFIED.  Most recent lab results (09/21/17) of CBC  is as follows: all values are WNL except for WBC at 23.4k, Lymphs Abs at 15.9k, Monocytes Abs at 1.1k. LDH 09/21/17 was WNL at 214. FISH 09/21/12 is pending.  [facility] 10/05/17 revealed CEP 12 at 100%,  13q14.3 is at 47.67% for normal nuclei, and 12.87% with mono allelic deletion of [id number]. CL TP53 for 17p13 revealed 100% with normal nuclei.  ATM for 11q22.3 revealed 81.67% with normal nuclei and 18.33% with positive nuclei for del 11q22.3  On review of systems, pt reports some lower abdominal pain, good energy levels, and denies fevers, chills, night sweats, weight loss, diarrhea, blood in the stools,  black stools, problems passing urine, and any other symptoms.   On PMHx the pt reports HTN. . On Social Hx the pt denies smoking at any point, any ETOH consumption, and illicit drug use.  Interval History:  Jordan Schroeder returns today for management and evaluation of his CLL/SLL after completing 6 planned cycles of BR treatment. The patient's last visit with [place] was on 01/07/2019. The pt reports that he is doing well overall.  The pt reports red, painful rash along the right chest wall. He describes the pain as a burning sensation.   Lab results today (05/13/19) of CBC w/diff and CMP is as follows: all values are WNL except for Glucose at 134. 05/13/2019 LDH at 168  On review of systems, pt reports red, painful rash along the right chest wall and denies fevers, chills, night sweats, unexpected weight loss, new lumps/bumps, abdominal pain and any other symptoms.    MEDICAL HISTORY:  Past Medical History:  Diagnosis Date  . Hypertension     SURGICAL HISTORY: No past surgical history on file.  SOCIAL HISTORY: Social History   Socioeconomic History  . Marital status: Married    Spouse name: Not on file  . Number of children: Not on file  . Years of education: Not on file  . Highest education level: Not on file  Occupational History  . Not on file  Social Needs  . Financial resource strain: Not on file  .  Food insecurity    Worry: Not on file    Inability: Not on file  . Transportation needs    Medical: Not on file    Non-medical: Not on file  Tobacco Use  . Smoking status: Never Smoker  . Smokeless tobacco: Never Used  Substance and Sexual Activity  . Alcohol use: Not Currently  . Drug use: Never  . Sexual activity: Not on file  Lifestyle  . Physical activity    Days per week: Not on file    Minutes per session: Not on file  . Stress: Not on file  Relationships  . Social Herbalist on phone: Not on file    Gets together: Not on file    Attends  religious service: Not on file    Active member of club or organization: Not on file    Attends meetings of clubs or organizations: Not on file    Relationship status: Not on file  . Intimate partner violence    Fear of current or ex partner: Not on file    Emotionally abused: Not on file    Physically abused: Not on file    Forced sexual activity: Not on file  Other Topics Concern  . Not on file  Social History Narrative  . Not on file    FAMILY HISTORY: No family history on file.  ALLERGIES:  has No Known Allergies.  MEDICATIONS:  Current Outpatient Medications  Medication Sig Dispense Refill  . acyclovir (ZOVIRAX) 400 MG tablet Take 1 tablet (400 mg total) by mouth daily. 30 tablet 3  . dexamethasone (DECADRON) 4 MG tablet Take 2 tablets (8 mg total) by mouth daily. Start the day after bendamustine chemotherapy for 2 days. Take with food. 30 tablet 1  . fenofibrate micronized (LOFIBRA) 67 MG capsule Take 67 mg by mouth daily before breakfast.    . hydrOXYzine (ATARAX/VISTARIL) 25 MG tablet Take 1 tablet (25 mg total) by mouth 3 (three) times daily as needed for anxiety or nausea. 30 tablet 0  . lisinopril (PRINIVIL,ZESTRIL) 10 MG tablet Take 10 mg by mouth daily.    . ondansetron (ZOFRAN) 8 MG tablet Take 1 tablet (8 mg total) by mouth 2 (two) times daily as needed for refractory nausea / vomiting. Start on day 2 after bendamustine chemo. 30 tablet 1  . prochlorperazine (COMPAZINE) 10 MG tablet Take 1 tablet (10 mg total) by mouth every 6 (six) hours as needed (Nausea or vomiting). 30 tablet 1  . sertraline (ZOLOFT) 100 MG tablet Take 100 mg by mouth daily.     No current facility-administered medications for this visit.     REVIEW OF SYSTEMS:   A 10+ POINT REVIEW OF SYSTEMS WAS OBTAINED including neurology, dermatology, psychiatry, cardiac, respiratory, lymph, extremities, GI, GU, Musculoskeletal, constitutional, breasts, reproductive, HEENT.  All pertinent positives are noted  in the HPI.  All others are negative.   PHYSICAL EXAMINATION: ECOG PERFORMANCE STATUS: 1 - Symptomatic but completely ambulatory  Vitals:   05/13/19 1222  BP: 117/77  Pulse: (!) 106  Resp: 18  Temp: 98.5 F (36.9 C)  SpO2: 98%   Filed Weights   05/13/19 1222  Weight: 285 lb 9.6 oz (129.5 kg)   .Body mass index is 43.43 kg/m.   GENERAL:alert, in no acute distress and comfortable SKIN: red, painful rash along right chest wall EYES: conjunctiva are pink and non-injected, sclera anicteric OROPHARYNX: MMM, no exudates, no oropharyngeal erythema or ulceration NECK: supple, no JVD  LYMPH:  no palpable lymphadenopathy in the cervical, axillary or inguinal regions LUNGS: clear to auscultation b/l with normal respiratory effort HEART: regular rate & rhythm ABDOMEN:  normoactive bowel sounds , non tender, not distended. No palpable hepatosplenomegaly.  Extremity: no pedal edema PSYCH: alert & oriented x 3 with fluent speech NEURO: no focal motor/sensory deficits  LABORATORY DATA:  I have reviewed the data as listed  . CBC Latest Ref Rng & Units 05/13/2019 01/07/2019 09/06/2018  WBC 4.0 - 10.5 K/uL 6.0 6.2 2.5(L)  Hemoglobin 13.0 - 17.0 g/dL 15.2 14.8 14.2  Hematocrit 39.0 - 52.0 % 44.4 42.5 43.3  Platelets 150 - 400 K/uL 223 211 189    . CMP Latest Ref Rng & Units 05/13/2019 01/07/2019 09/06/2018  Glucose 70 - 99 mg/dL 134(H) 143(H) 96  BUN 6 - 20 mg/dL _0 Creatinine 0.61 - 1.24 mg/dL 0.84 0.86 0.80  Sodium 135 - 145 mmol/L 140 141 138  Potassium 3.5 - 5.1 mmol/L 4.1 3.9 4.3  Chloride 98 - 111 mmol/L 105 108 105  CO2 22 - 32 mmol/L _1 Calcium 8.9 - 10.3 mg/dL 9.6 9.2 9.5  Total Protein 6.5 - 8.1 g/dL 7.2 7.1 7.1  Total Bilirubin 0.3 - 1.2 mg/dL 0.5 0.5 0.8  Alkaline Phos 38 - 126 U/L 75 78 62  AST 15 - 41 U/L 24 23 33  ALT 0 - 44 U/L 32 27 36   Component     Latest Ref Rng & Units 10/30/2017  HIV Screen 4th Generation wRfx     Non Reactive Non Reactive  HCV Ab      0.0 - 0.9 s/co ratio <0.1  Hepatitis B Surface Ag     Negative Negative  Hep B Core Ab, Tot     Negative Negative      10/25/17 Tissue Flow Cytometry:    10/25/17 Needle/core Bx:   [facility] Oncology  Order: [id number]  Status:  Final result Visible to patient:  No (Not Released) Next appt:  11/07/2017 at 09:00 AM in Radiology ([facility] PET) Dx:  Lymphocytosis  Component 12moago  Specimen Type Comment:   Comment: BLOOD  Cells Counted 200   Cells Analyzed 200   FISH Result Comment:   Comment: NORMAL: NO CYCLIN D1 OR IGH GENE REARRANGEMENT OBSERVED  Interpretation Comment:   Comment: (NOTE)        nuc ish 11q13(CCND1x2),14q32(IGHx2)[200]    The fluorescence in situ hybridization (FISH) result  was normal. Dual color dual fusion DNA FISH probes targeting  the cyclin D1 gene (CCND1 or BCL1)([employer] and the  heavy chain immunoglobulin gene (IgH) at 14q32 showed two  hybridization signals each with no fusion in all cells  examined. Thus, there was NO apparent rearrangement of the  primary genes involved with mantle cell lymphoma, and to a  lesser extent myeloma. .Marland Kitchen        RADIOGRAPHIC STUDIES: I have personally reviewed the radiological images as listed and agreed with the findings in the report. No results found.  ASSESSMENT & PLAN:   38y.o. male with  1. 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  -Reviewed 10/11/17 CT of C/A/P which revealed lymphadenopathy throughout the chest, abdomen and pelvis. Most notably in the axilla and abdomen.   -  Noted 13q deletion: 13q14.3 is at 47.67% for normal nuclei, and 72.62% with mono allelic deletion of [id number].  -ATM for 11q22.3 revealed 81.67% with normal nuclei and 18.33% with positive nuclei for del  11q22.3 ---> point more to CLL/SLL   02/26/18 CT C/A/P revealed  Marked response to therapy as evidenced by decrease in size or resolution of adenopathy throughout chest, abdomen and pelvis. 2. Hepatic steatosis. Slight marginal irregularity suggests cirrhosis. 3. Enlarged pulmonary arteries, indicative of pulmonary arterial hypertension.   S/p 6 cycles of BR completed on 04/19/18  06/04/18 CT C/A/P revealed Stable CT chest without thoracic lymphadenopathy. Continued further decrease in abdominal and pelvic lymphadenopathy. No new or progressive lymphadenopathy on today's exam. 2. Hepatic steatosis. Subtle nodularity of liver contour raises the question of cirrhosis. 3. Pulmonary arterial enlargement. Pulmonary arterial hypertension a distinct concern.  2. H/o Rituxan infusion reaction-  -Had significant flushing with rituxan needing steroids. Not a candidate in future for rapid infusion protocol.  PLAN: -Discussed pt labwork today, 05/13/19; blood counts and chemistries are nml  -Discussed 05/13/2019 LDH is WNL at 168 -The pt shows no clinical or lab progression of his CLL at this time.  -No indication for further treatment at this time.  -Discussed that the patient has responded very well to treatment and that we will continue watching him clinically and his labs regularly. He will monitor for constitutional symptoms.  -Advised that the patient receive his annual flu vaccine and both pneumonia vaccines every 5 years -Pt appears to have Shingles with Neuralgia -Advised pt to avoid contact with those who may have had the chicken pox virus -Advised pt that he can use OTC Caladryl spray  -Rx Valtrex, Percocet and lidocaine ointment -Will see back in 10-12 days via phone -Will see back in 3 months with labs    3. Sleep apnea Notes he had a sleep study since his last clinic visit and diagnosis was confirmed. -Pt now uses CPAP   FOLLOW UP: -Phone visit in  10-12 days with Dr Irene Limbo -RTC with  Dr Irene Limbo in 3 months with labs  The total time spent in the appt was 25 minutes and more than 50% was on counseling and direct patient cares.  All of the patient's questions were answered with apparent satisfaction. The patient knows to call the clinic with any problems, questions or concerns.    Sullivan Lone MD [place] AAHIVMS [facility] Hematology/Oncology Physician [facility]  (Office):       [phone] (Work cell):  [phone] (Fax):           [phone]  05/13/2019 1:26 PM   I, Yevette Edwards, am acting as a scribe for Dr. Sullivan Lone.   .I have reviewed the above documentation for accuracy and completeness, and I agree with the above. Brunetta Genera MD

## 2019-05-10 ENCOUNTER — Other Ambulatory Visit: Payer: 59

## 2019-05-10 ENCOUNTER — Ambulatory Visit: Payer: 59

## 2019-05-10 ENCOUNTER — Ambulatory Visit: Payer: 59 | Admitting: Hematology

## 2019-05-13 ENCOUNTER — Inpatient Hospital Stay: Payer: No Typology Code available for payment source

## 2019-05-13 ENCOUNTER — Inpatient Hospital Stay: Payer: No Typology Code available for payment source | Attending: Hematology

## 2019-05-13 ENCOUNTER — Inpatient Hospital Stay: Payer: No Typology Code available for payment source | Admitting: Hematology

## 2019-05-13 ENCOUNTER — Other Ambulatory Visit: Payer: Self-pay

## 2019-05-13 VITALS — BP 117/77 | HR 106 | Temp 98.5°F | Resp 18 | Ht 68.0 in | Wt 285.6 lb

## 2019-05-13 DIAGNOSIS — B0229 Other postherpetic nervous system involvement: Secondary | ICD-10-CM

## 2019-05-13 DIAGNOSIS — R232 Flushing: Secondary | ICD-10-CM | POA: Diagnosis not present

## 2019-05-13 DIAGNOSIS — R590 Localized enlarged lymph nodes: Secondary | ICD-10-CM | POA: Diagnosis not present

## 2019-05-13 DIAGNOSIS — G4730 Sleep apnea, unspecified: Secondary | ICD-10-CM | POA: Diagnosis not present

## 2019-05-13 DIAGNOSIS — Z79899 Other long term (current) drug therapy: Secondary | ICD-10-CM | POA: Diagnosis not present

## 2019-05-13 DIAGNOSIS — I2721 Secondary pulmonary arterial hypertension: Secondary | ICD-10-CM | POA: Diagnosis not present

## 2019-05-13 DIAGNOSIS — C9190 Lymphoid leukemia, unspecified not having achieved remission: Secondary | ICD-10-CM | POA: Diagnosis not present

## 2019-05-13 DIAGNOSIS — C8308 Small cell B-cell lymphoma, lymph nodes of multiple sites: Secondary | ICD-10-CM

## 2019-05-13 DIAGNOSIS — I10 Essential (primary) hypertension: Secondary | ICD-10-CM | POA: Diagnosis not present

## 2019-05-13 DIAGNOSIS — B029 Zoster without complications: Secondary | ICD-10-CM

## 2019-05-13 DIAGNOSIS — D696 Thrombocytopenia, unspecified: Secondary | ICD-10-CM | POA: Insufficient documentation

## 2019-05-13 DIAGNOSIS — K760 Fatty (change of) liver, not elsewhere classified: Secondary | ICD-10-CM | POA: Diagnosis not present

## 2019-05-13 LAB — CBC WITH DIFFERENTIAL/PLATELET
Abs Immature Granulocytes: 0.02 10*3/uL (ref 0.00–0.07)
Basophils Absolute: 0 10*3/uL (ref 0.0–0.1)
Basophils Relative: 1 %
Eosinophils Absolute: 0.2 10*3/uL (ref 0.0–0.5)
Eosinophils Relative: 3 %
HCT: 44.4 % (ref 39.0–52.0)
Hemoglobin: 15.2 g/dL (ref 13.0–17.0)
Immature Granulocytes: 0 %
Lymphocytes Relative: 28 %
Lymphs Abs: 1.7 10*3/uL (ref 0.7–4.0)
MCH: 30.8 pg (ref 26.0–34.0)
MCHC: 34.2 g/dL (ref 30.0–36.0)
MCV: 90.1 fL (ref 80.0–100.0)
Monocytes Absolute: 0.5 10*3/uL (ref 0.1–1.0)
Monocytes Relative: 9 %
Neutro Abs: 3.6 10*3/uL (ref 1.7–7.7)
Neutrophils Relative %: 59 %
Platelets: 223 10*3/uL (ref 150–400)
RBC: 4.93 MIL/uL (ref 4.22–5.81)
RDW: 12.1 % (ref 11.5–15.5)
WBC: 6 10*3/uL (ref 4.0–10.5)
nRBC: 0 % (ref 0.0–0.2)

## 2019-05-13 LAB — CMP (CANCER CENTER ONLY)
ALT: 32 U/L (ref 0–44)
AST: 24 U/L (ref 15–41)
Albumin: 4.5 g/dL (ref 3.5–5.0)
Alkaline Phosphatase: 75 U/L (ref 38–126)
Anion gap: 11 (ref 5–15)
BUN: 13 mg/dL (ref 6–20)
CO2: 24 mmol/L (ref 22–32)
Calcium: 9.6 mg/dL (ref 8.9–10.3)
Chloride: 105 mmol/L (ref 98–111)
Creatinine: 0.84 mg/dL (ref 0.61–1.24)
GFR, Est AFR Am: >60 mL/min (ref >60)
GFR, Estimated: >60 mL/min (ref >60)
Glucose, Bld: 134 mg/dL — ABNORMAL HIGH (ref 70–99)
Potassium: 4.1 mmol/L (ref 3.5–5.1)
Sodium: 140 mmol/L (ref 135–145)
Total Bilirubin: 0.5 mg/dL (ref 0.3–1.2)
Total Protein: 7.2 g/dL (ref 6.5–8.1)

## 2019-05-13 LAB — LACTATE DEHYDROGENASE: LDH: 168 U/L (ref 98–192)

## 2019-05-13 MED ORDER — OXYCODONE-ACETAMINOPHEN 5-325 MG PO TABS: 1.0000 | ORAL_TABLET | ORAL | 0 refills | Status: DC | PRN

## 2019-05-13 MED ORDER — LIDOCAINE 5 % EX OINT: 1.0000 "application " | TOPICAL_OINTMENT | CUTANEOUS | 0 refills | Status: DC | PRN

## 2019-05-13 MED ORDER — VALACYCLOVIR HCL 1 G PO TABS: 1000.0000 mg | ORAL_TABLET | Freq: Three times a day (TID) | ORAL | 0 refills | Status: DC

## 2019-05-14 ENCOUNTER — Telehealth: Payer: Self-pay | Admitting: Hematology

## 2019-05-14 NOTE — Telephone Encounter (Signed)
Scheduled appt per 11/9 los.  Left a vm of the appt date and time.

## 2019-05-23 ENCOUNTER — Inpatient Hospital Stay (HOSPITAL_BASED_OUTPATIENT_CLINIC_OR_DEPARTMENT_OTHER): Payer: No Typology Code available for payment source | Admitting: Hematology

## 2019-05-23 DIAGNOSIS — C8308 Small cell B-cell lymphoma, lymph nodes of multiple sites: Secondary | ICD-10-CM

## 2019-05-23 DIAGNOSIS — B029 Zoster without complications: Secondary | ICD-10-CM

## 2019-05-23 MED ORDER — LIDOCAINE 5 % EX OINT: 1.0000 "application " | TOPICAL_OINTMENT | CUTANEOUS | 0 refills | Status: DC | PRN

## 2019-05-23 MED ORDER — VALACYCLOVIR HCL 1 G PO TABS: 1000.0000 mg | ORAL_TABLET | Freq: Three times a day (TID) | ORAL | 0 refills | Status: AC

## 2019-05-23 NOTE — Progress Notes (Signed)
HEMATOLOGY/ONCOLOGY CLINIC NOTE  Date of Service: 05/23/19  Patient Care Team: [name], MD as PCP - General (Family Medicine)  CHIEF COMPLAINTS/PURPOSE OF CONSULTATION:  F/u for mx of CLL/SLL  HISTORY OF PRESENTING ILLNESS:   Jordan Schroeder is a wonderful 38 y.o. 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 He is accompanied today by his mother. The pt reports that he is doing well overall.   The pt reports that he has not had any significant medical problems in the past. He notes that on an annual physical, his PCP noticed his WBC was high, and after repeat blood tests, he was referred to Dr. Nicholas Lose. Dr. Lindi Adie has already begun the initial work ups as noted below. The pt notes that he does not notice any change in his energy levels or feeling any different over the last 6 months.   He notes that he has some concerns for sleep apnea as he snores a lot at night but does not wake up due to SOB.   Of note prior to the patient's visit today, pt has had CT C/A/P completed on 10/11/17 with results revealing Lymphadenopathy throughout the chest, abdomen, and pelvis.   On 09/21/17 the pt also had a Flow Cytometry revealing MONOCLONAL B-CELL POPULATION IDENTIFIED.  Most recent lab results (09/21/17) of CBC  is as follows: all values are WNL except for WBC at 23.4k, Lymphs Abs at 15.9k, Monocytes Abs at 1.1k. LDH 09/21/17 was WNL at 214. FISH 09/21/12 is pending.  [facility] 10/05/17 revealed CEP 12 at 100%,  13q14.3 is at 47.67% for normal nuclei, and 97.84% with mono allelic deletion of R84X282. CL TP53 for 17p13 revealed 100% with normal nuclei.  ATM for 11q22.3 revealed 81.67% with normal nuclei and 18.33% with positive nuclei for del 11q22.3  On review of systems, pt reports some lower abdominal pain, good energy levels, and denies fevers, chills, night sweats, weight loss, diarrhea, blood in the stools,  black stools, problems passing urine, and any other symptoms.   On PMHx the pt reports HTN. . On Social Hx the pt denies smoking at any point, any ETOH consumption, and illicit drug use.  Interval History: I connected with [name] on 05/23/19 at  3:00 PM EST by Telehealth and verified that I am speaking with the correct person using two identifiers.   I discussed the limitations, risks, security and privacy concerns of performing an evaluation and management service by telemedicine and the availability of in-person appointments. I also discussed with the patient that there may be a patient responsible charge related to this service. The patient expressed understanding and agreed to proceed.   Patient's location: Home  Provider's location: [place]   Chief Complaint:  CLL/SLL   [name] returns today for management and evaluation of his CLL/SLL after completing 6 planned cycles of BR treatment. The patient's last visit with [place] was on 05/13/2019. The pt reports that he is doing well overall.  The pt reports He is doing better  His rashes are improving and are drying out. They have faded some. He says his back still feels sensitive, like little cuts on his back. There are no new rashes.  He is using pain medication once a day at night.  He is using an over the counter calamine spray and will complete the last dose of antiviral medications.  He needs a refill of lidocaine  ointment.   Lab results today (05/13/19) of CBC w/diff and CMP is as follows: all values are WNL except for Glucose Bld at 134.  On review of systems, pt reports current rashes are crusting over and denies new rashes and any other symptoms.    MEDICAL HISTORY:  Past Medical History:  Diagnosis Date  . Hypertension     SURGICAL HISTORY: No past surgical history on file.  SOCIAL HISTORY: Social History   Socioeconomic History  . Marital status: Married    Spouse name: Not on file  . Number of  children: Not on file  . Years of education: Not on file  . Highest education level: Not on file  Occupational History  . Not on file  Social Needs  . Financial resource strain: Not on file  . Food insecurity    Worry: Not on file    Inability: Not on file  . Transportation needs    Medical: Not on file    Non-medical: Not on file  Tobacco Use  . Smoking status: Never Smoker  . Smokeless tobacco: Never Used  Substance and Sexual Activity  . Alcohol use: Not Currently  . Drug use: Never  . Sexual activity: Not on file  Lifestyle  . Physical activity    Days per week: Not on file    Minutes per session: Not on file  . Stress: Not on file  Relationships  . Social Herbalist on phone: Not on file    Gets together: Not on file    Attends religious service: Not on file    Active member of club or organization: Not on file    Attends meetings of clubs or organizations: Not on file    Relationship status: Not on file  . Intimate partner violence    Fear of current or ex partner: Not on file    Emotionally abused: Not on file    Physically abused: Not on file    Forced sexual activity: Not on file  Other Topics Concern  . Not on file  Social History Narrative  . Not on file    FAMILY HISTORY: No family history on file.  ALLERGIES:  has No Known Allergies.  MEDICATIONS:  Current Outpatient Medications  Medication Sig Dispense Refill  . acyclovir (ZOVIRAX) 400 MG tablet Take 1 tablet (400 mg total) by mouth daily. 30 tablet 3  . dexamethasone (DECADRON) 4 MG tablet Take 2 tablets (8 mg total) by mouth daily. Start the day after bendamustine chemotherapy for 2 days. Take with food. 30 tablet 1  . fenofibrate micronized (LOFIBRA) 67 MG capsule Take 67 mg by mouth daily before breakfast.    . hydrOXYzine (ATARAX/VISTARIL) 25 MG tablet Take 1 tablet (25 mg total) by mouth 3 (three) times daily as needed for anxiety or nausea. 30 tablet 0  . lidocaine (XYLOCAINE) 5 %  ointment Apply 1 application topically as needed. 1-2 times daily in thin layer over painful shingles rash on chest wall 35.44 g 0  . lisinopril (PRINIVIL,ZESTRIL) 10 MG tablet Take 10 mg by mouth daily.    . ondansetron (ZOFRAN) 8 MG tablet Take 1 tablet (8 mg total) by mouth 2 (two) times daily as needed for refractory nausea / vomiting. Start on day 2 after bendamustine chemo. 30 tablet 1  . oxyCODONE-acetaminophen (PERCOCET/ROXICET) 5-325 MG tablet Take 1 tablet by mouth every 4 (four) hours as needed for severe pain (severe pain from Shingles rash). 30 tablet 0  .  prochlorperazine (COMPAZINE) 10 MG tablet Take 1 tablet (10 mg total) by mouth every 6 (six) hours as needed (Nausea or vomiting). 30 tablet 1  . sertraline (ZOLOFT) 100 MG tablet Take 100 mg by mouth daily.    . valACYclovir (VALTREX) 1000 MG tablet Take 1 tablet (1,000 mg total) by mouth 3 (three) times daily for 10 days. Drink atleast 2072m of water daily while on medication. 30 tablet 0   No current facility-administered medications for this visit.     REVIEW OF SYSTEMS:   A 10+ POINT REVIEW OF SYSTEMS WAS OBTAINED including neurology, dermatology, psychiatry, cardiac, respiratory, lymph, extremities, GI, GU, Musculoskeletal, constitutional, breasts, reproductive, HEENT.  All pertinent positives are noted in the HPI.  All others are negative. .Marland Kitchen  PHYSICAL EXAMINATION: There were no vitals filed for this visit. Wt Readings from Last 3 Encounters:  05/13/19 285 lb 9.6 oz (129.5 kg)  01/07/19 283 lb 6.4 oz (128.5 kg)  09/06/18 265 lb 11.2 oz (120.5 kg)   There is no height or weight on file to calculate BMI.    Telehealth Visit 05/23/19   LABORATORY DATA:  I have reviewed the data as listed  . CBC Latest Ref Rng & Units 05/13/2019 01/07/2019 09/06/2018  WBC 4.0 - 10.5 K/uL 6.0 6.2 2.5(L)  Hemoglobin 13.0 - 17.0 g/dL 15.2 14.8 14.2  Hematocrit 39.0 - 52.0 % 44.4 42.5 43.3  Platelets 150 - 400 K/uL 223 211 189    . CMP  Latest Ref Rng & Units 05/13/2019 01/07/2019 09/06/2018  Glucose 70 - 99 mg/dL 134(H) 143(H) 96  BUN 6 - 20 mg/dL '13 12 18  ' Creatinine 0.61 - 1.24 mg/dL 0.84 0.86 0.80  Sodium 135 - 145 mmol/L 140 141 138  Potassium 3.5 - 5.1 mmol/L 4.1 3.9 4.3  Chloride 98 - 111 mmol/L 105 108 105  CO2 22 - 32 mmol/L '24 23 27  ' Calcium 8.9 - 10.3 mg/dL 9.6 9.2 9.5  Total Protein 6.5 - 8.1 g/dL 7.2 7.1 7.1  Total Bilirubin 0.3 - 1.2 mg/dL 0.5 0.5 0.8  Alkaline Phos 38 - 126 U/L 75 78 62  AST 15 - 41 U/L 24 23 33  ALT 0 - 44 U/L 32 27 36   Component     Latest Ref Rng & Units 10/30/2017  HIV Screen 4th Generation wRfx     Non Reactive Non Reactive  HCV Ab     0.0 - 0.9 s/co ratio <0.1  Hepatitis B Surface Ag     Negative Negative  Hep B Core Ab, Tot     Negative Negative      10/25/17 Tissue Flow Cytometry:    10/25/17 Needle/core Bx:   [facility]  Order: [id number] Status:  Final result Visible to patient:  No (Not Released) Next appt:  11/07/2017 at 09:00 AM in Radiology ([facility] PET) Dx:  Lymphocytosis  Component 163mogo  Specimen Type Comment:   Comment: BLOOD  Cells Counted 200   Cells Analyzed 200   FISH Result Comment:   Comment: NORMAL: NO CYCLIN D1 OR IGH GENE REARRANGEMENT OBSERVED  Interpretation Comment:   Comment: (NOTE)        nuc ish 11q13(CCND1x2),14q32(IGHx2)[200]    The fluorescence in situ hybridization (FISH) result  was normal. Dual color dual fusion DNA FISH probes targeting  the cyclin D1 gene (CCND1 or BCL1)([employer] and the  heavy chain immunoglobulin gene (IgH) at 14q32 showed two  hybridization signals each with no fusion in  all cells  examined. Thus, there was NO apparent rearrangement of the  primary genes involved with mantle cell lymphoma, and to a  lesser extent myeloma. Marland Kitchen         RADIOGRAPHIC STUDIES: I have personally reviewed the radiological images as listed and agreed with the findings in the report. No results found.   ASSESSMENT & PLAN:   38 y.o. male with  1. 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  -Reviewed 10/11/17 CT of C/A/P which revealed lymphadenopathy throughout the chest, abdomen and pelvis. Most notably in the axilla and abdomen.   -Noted 13q deletion: 13q14.3 is at 47.67% for normal nuclei, and 09.38% with mono allelic deletion of H82X937.  -ATM for 11q22.3 revealed 81.67% with normal nuclei and 18.33% with positive nuclei for del 11q22.3 ---> point more to CLL/SLL   02/26/18 CT C/A/P revealed  Marked response to therapy as evidenced by decrease in size or resolution of adenopathy throughout chest, abdomen and pelvis. 2. Hepatic steatosis. Slight marginal irregularity suggests cirrhosis. 3. Enlarged pulmonary arteries, indicative of pulmonary arterial hypertension.   S/p 6 cycles of BR completed on 04/19/18  06/04/18 CT C/A/P revealed Stable CT chest without thoracic lymphadenopathy. Continued further decrease in abdominal and pelvic lymphadenopathy. No new or progressive lymphadenopathy on today's exam. 2. Hepatic steatosis. Subtle nodularity of liver contour raises the question of cirrhosis. 3. Pulmonary arterial enlargement. Pulmonary arterial hypertension a distinct concern.  2. H/o Rituxan infusion reaction-  -Had significant flushing with rituxan needing steroids. Not a candidate in future for rapid infusion protocol.  3. Sleep apnea Notes he had a sleep study since his last clinic visit and diagnosis was confirmed. -Pt now uses CPAP  PLAN: -Discussed pt labwork today, 05/13/19; CBC w/diff and CMP is as follows: all values are WNL except for Glucose Bld at 134. -Discussed the possibility of adding Neurontin if issues persist  -Advised to drink lots of water while taking  antiviral medication  -Discussed adding additional 5-7 days of valtrex  -continue lidocaine and calydryl lotion use locally as needed  FOLLOW UP: RTC with Dr Irene Limbo with labs in 3 months  I connected with  [name] on 05/23/19 by a video enabled telemedicine application and verified that I am speaking with the correct person using two identifiers.   I discussed the limitations of evaluation and management by telemedicine. The patient expressed understanding and agreed to proceed.   The total time spent in the appt was 20 minutes and more than 50% was on counseling and direct patient cares.  All of the patient's questions were answered with apparent satisfaction. The patient knows to call the clinic with any problems, questions or concerns.   Sullivan Lone MD MS AAHIVMS [facility] Hematology/Oncology Physician [facility]  (Office):       [phone] (Work cell):  [phone] (Fax):           [phone]  05/23/2019 5:34 AM  I, Scot Dock, am acting as a scribe for Dr. Sullivan Lone.   .I have reviewed the above documentation for accuracy and completeness, and I agree with the above. Brunetta Genera MD

## 2019-05-24 ENCOUNTER — Telehealth: Payer: Self-pay | Admitting: Hematology

## 2019-05-24 NOTE — Telephone Encounter (Signed)
Called pt to schedule appt per 11/19 los.  Pt stated he wanted to keep the appts that were already scheduled for feb.  Patient aware of the appt date and time.

## 2019-05-28 ENCOUNTER — Telehealth: Payer: Self-pay | Admitting: *Deleted

## 2019-05-28 NOTE — Telephone Encounter (Signed)
"  Jordan Schroeder and patienet Jordan Schroeder [phone]) calling to request forms faxed to [employer] [facility] also be faxed to Leslie., Philomena Course [phone]).  He is to return to work 06-05-2019 so HR needs a note from the doctor that it is okay for him to return to work  (RTW).  I can drop off the RTW form tomorrow or a simple letter is okay."

## 2019-06-05 ENCOUNTER — Telehealth: Payer: Self-pay | Admitting: *Deleted

## 2019-06-05 ENCOUNTER — Other Ambulatory Visit: Payer: Self-pay | Admitting: Hematology

## 2019-06-05 MED ORDER — VALACYCLOVIR HCL 500 MG PO TABS: 500.0000 mg | ORAL_TABLET | Freq: Two times a day (BID) | ORAL | 2 refills | Status: DC

## 2019-06-05 MED ORDER — GABAPENTIN 300 MG PO CAPS: 300.0000 mg | ORAL_CAPSULE | Freq: Every day | ORAL | 1 refills | Status: DC

## 2019-06-05 NOTE — Telephone Encounter (Signed)
Patient called. Last appt with Dr. Irene Limbo was advised  that if pain from shingles continued, he could prescribe Neurontin for patient. He is still having pain. Dr. Irene Limbo informed.

## 2019-06-06 NOTE — Telephone Encounter (Signed)
Contacted patient - Dr. Irene Limbo ordered Neurontin 300 mg once daily at bedtime for patient. Also advised can use OTC lidocaine patches if he would like. Patient verbalized understanding.

## 2019-06-07 ENCOUNTER — Telehealth: Payer: Self-pay | Admitting: *Deleted

## 2019-06-07 NOTE — Telephone Encounter (Signed)
06/04/2019 connected with mother Felton Garnet who provided the correct fax number for [employer] as [phone].    "He Now would like to try to return to work 06/10/2019 not tomorrow because rash still present but painful.  It dried up maybe a week ago.  I will drop off a new R.T.W form and an appeal for leave.  Shingles was not included; he's ben out of work since 05/13/2019 when Dr. Irene Limbo told him the rash is shingles.  He need both sent to [facility] and [employer]."

## 2019-06-07 NOTE — Telephone Encounter (Signed)
No new entry

## 2019-06-07 NOTE — Telephone Encounter (Signed)
Connected with Kaydence Spillane's mom notifying successful submission of leave appeal and RTW form.  Request originals be mailed.

## 2019-06-23 IMAGING — CT CT BIOPSY
1 of 2 series · 13 of 32 positions shown, 19 images · non-contrast
Comparison: none

CLINICAL DATA: Elevated white blood cell count. Multi station
adenopathy.

[Series 2: i-spiral 5.0 b31f · axial · 0.98mm/px · z∈[-144,-42]mm · 13 of 35 slices shown, 19 images]
[im 3/35  soft-tissue]
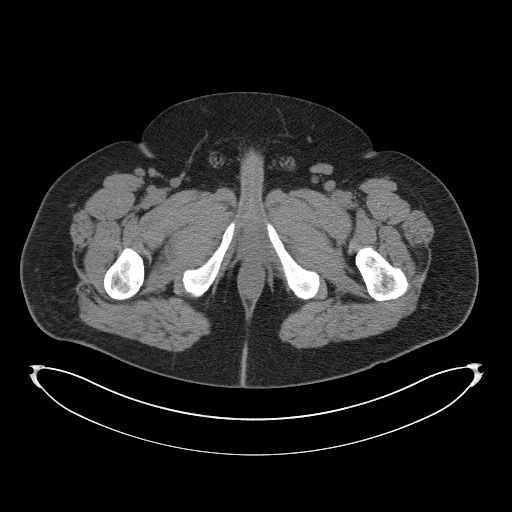
[im 3/35  bone]
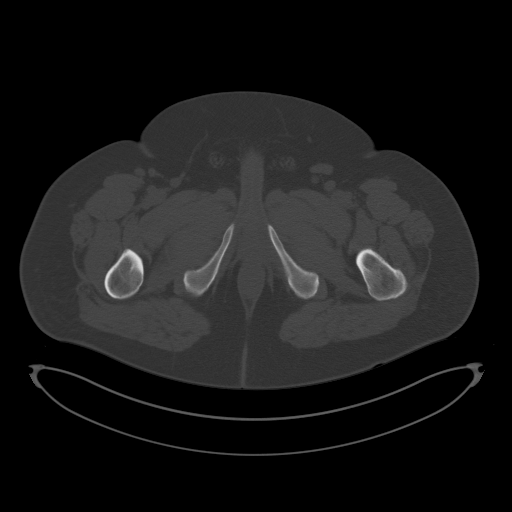
[im 5/35  soft-tissue]
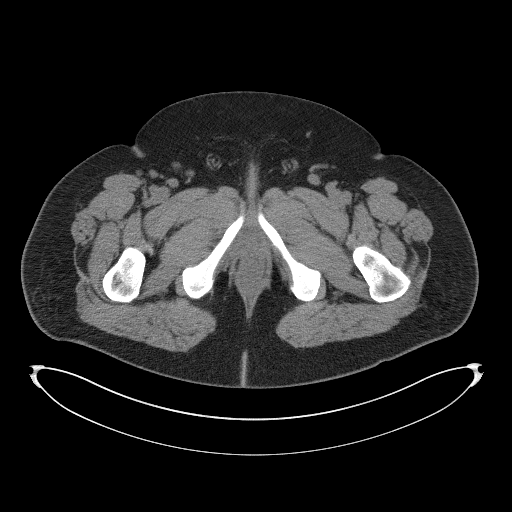
[im 7/35  soft-tissue]
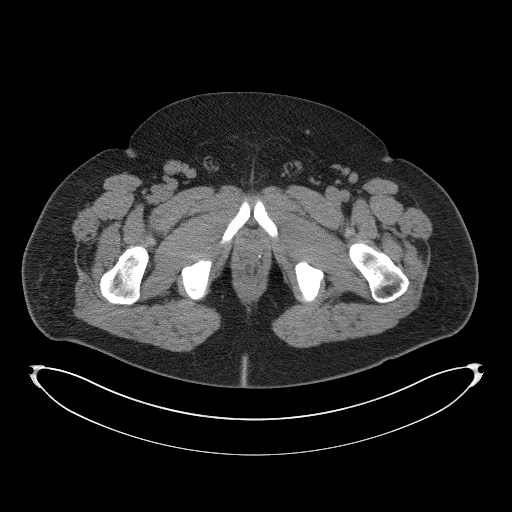
[im 10/35  soft-tissue]
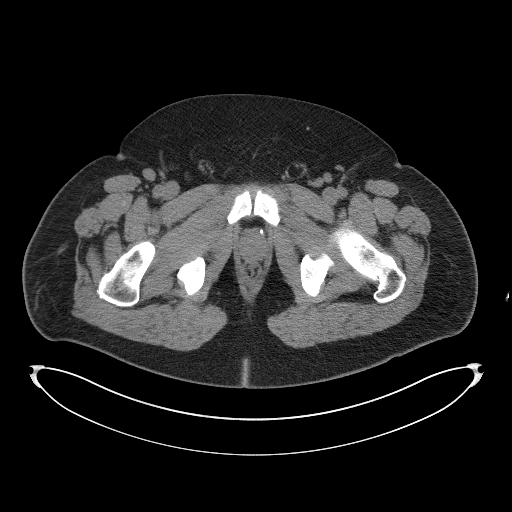
[im 12/35  soft-tissue]
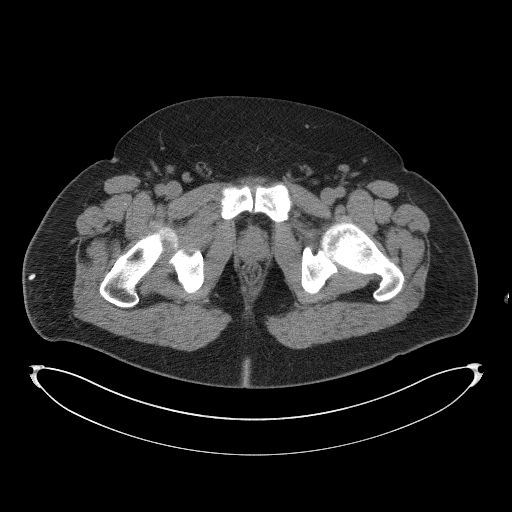
[im 14/35  soft-tissue]
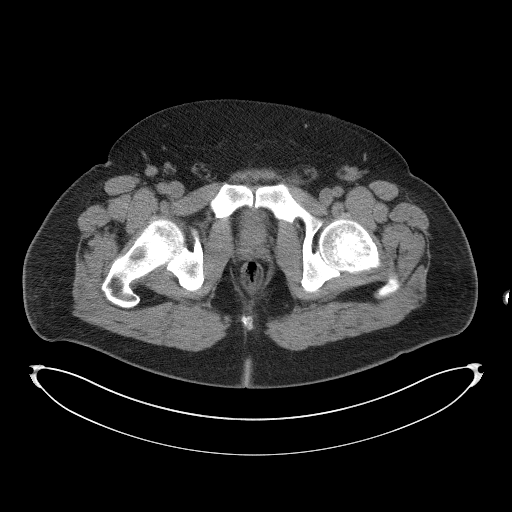
[im 19/35  soft-tissue]
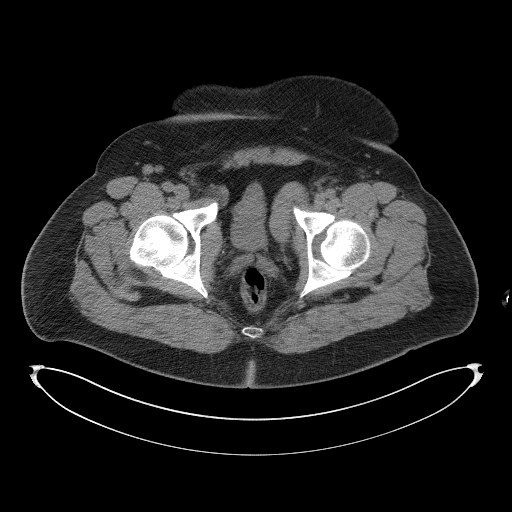
[im 21/35  soft-tissue]
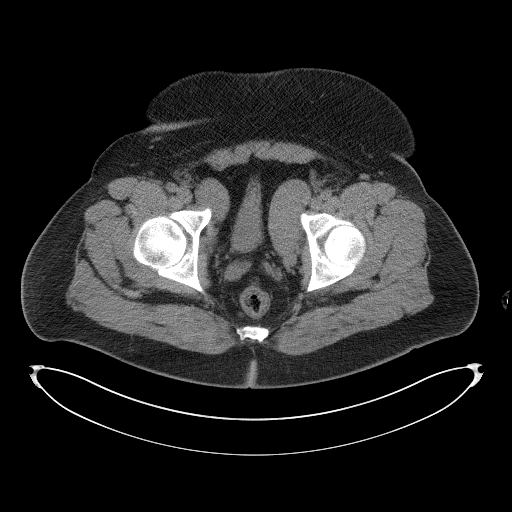
[im 23/35  soft-tissue]
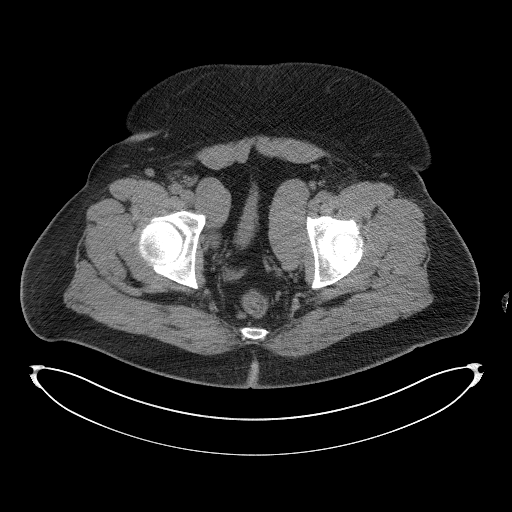
[im 23/35  bone]
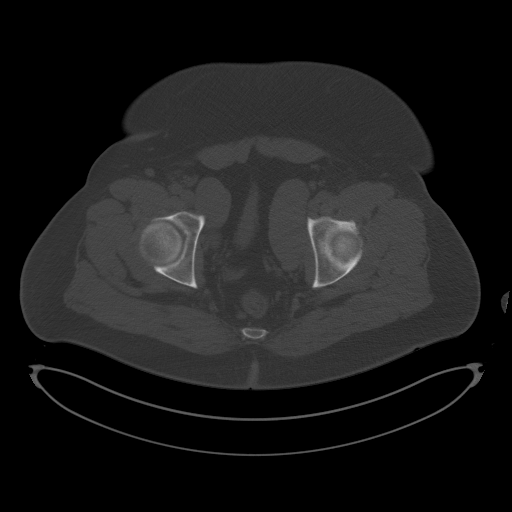
[im 25/35  soft-tissue]
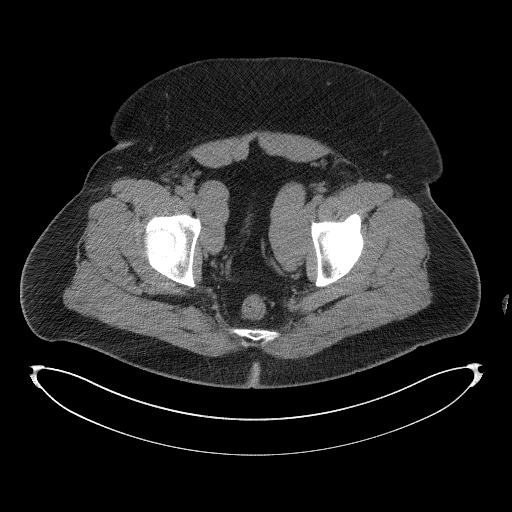
[im 25/35  lung]
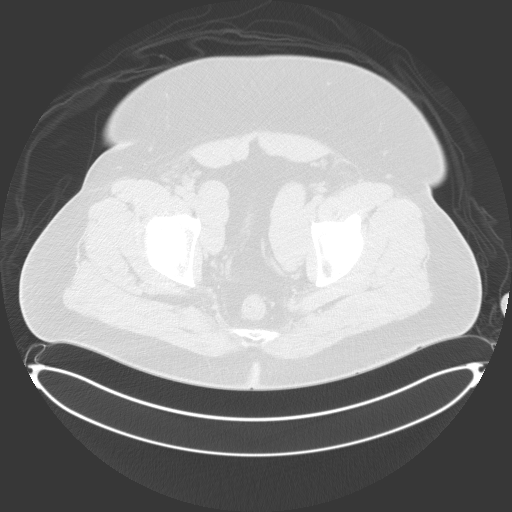
[im 28/35  soft-tissue]
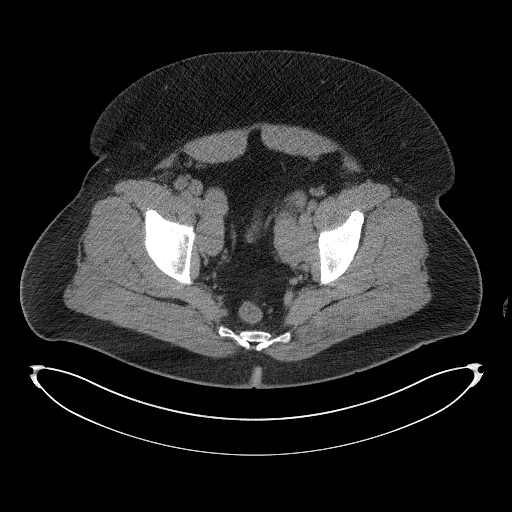
[im 28/35  lung]
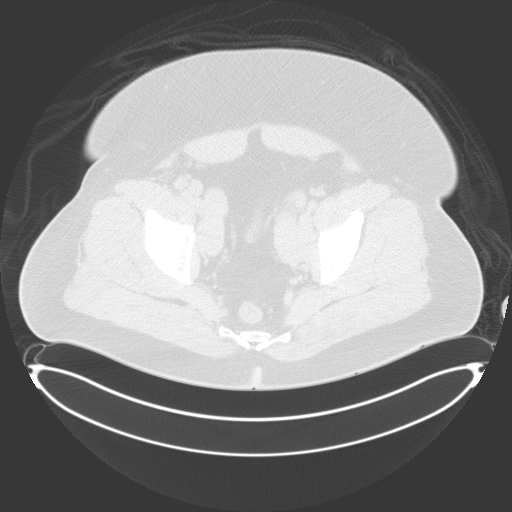
[im 30/35  soft-tissue]
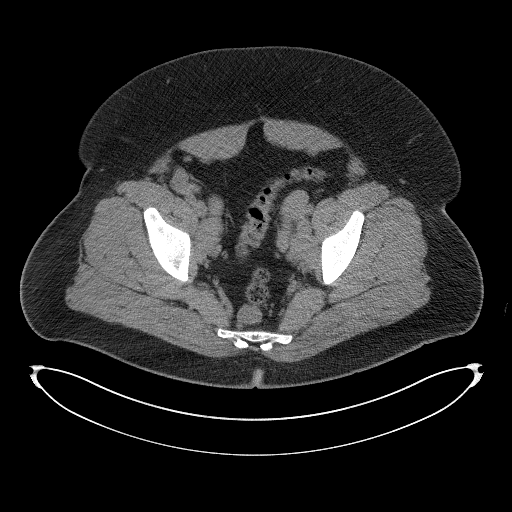
[im 30/35  lung]
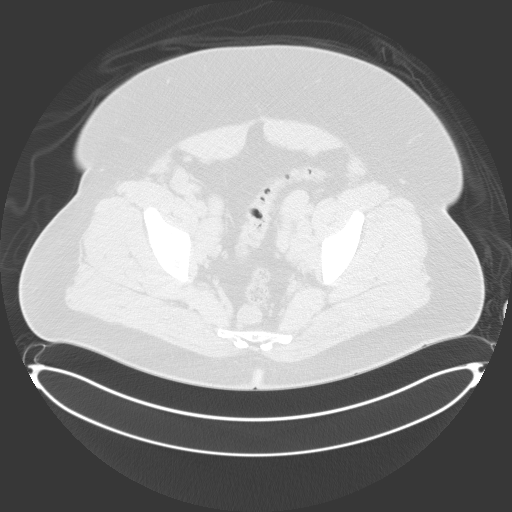
[im 32/35  soft-tissue]
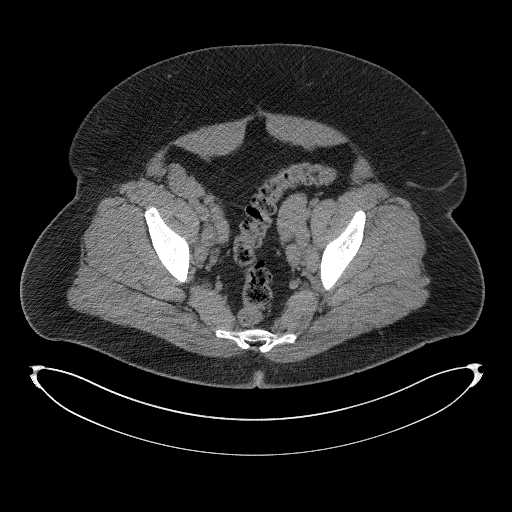
[im 32/35  lung]
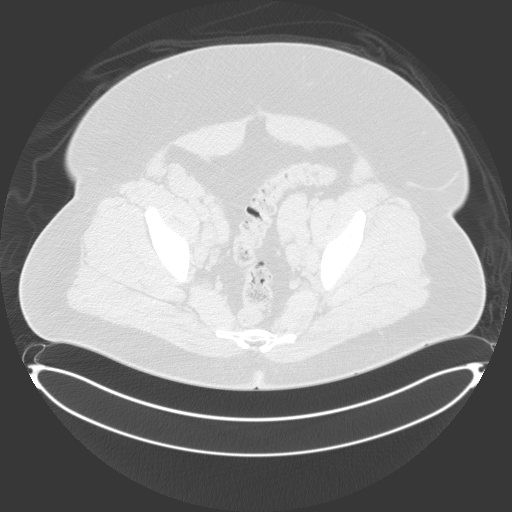

[13 of 32 positions shown; findings below may reference images not displayed]

EXAM:
CT GUIDED CORE BIOPSY OF LEFT EXTERNAL ILIAC ADENOPATHY

ANESTHESIA/SEDATION:
Intravenous Fentanyl and Versed were administered as conscious
sedation during continuous monitoring of the patient's level of
consciousness and physiological / cardiorespiratory status by the
radiology RN, with a total moderate sedation time of 10 minutes.

PROCEDURE:
The procedure risks, benefits, and alternatives were explained to
the patient. Questions regarding the procedure were encouraged and
answered. The patient understands and consents to the procedure.

Select axial scans through the pelvis obtained. The left external
iliac adenopathy was localized and an appropriate skin entry site
was determined and marked.

The operative field was prepped with chlorhexidinein a sterile
fashion, and a sterile drape was applied covering the operative
field. A sterile gown and sterile gloves were used for the
procedure. Local anesthesia was provided with 1% Lidocaine.

Under CT fluoroscopic guidance, a 17 gauge trocar needle was
advanced to the margin of the lesion. Once needle tip position was
confirmed, coaxial 18-gauge core biopsy samples were obtained,
submitted in saline to surgical pathology. The guide needle was
removed. Postprocedure scans show no hemorrhage or other apparent
complication. The patient tolerated the procedure well.

COMPLICATIONS:
None immediate
FINDINGS: Bilateral external iliac adenopathy was identified. Representative
core biopsy samples of left external iliac adenopathy obtained as
above.
IMPRESSION: 1. Technically successful CT-guided core biopsy, left external iliac
adenopathy.

## 2019-08-13 ENCOUNTER — Inpatient Hospital Stay (HOSPITAL_BASED_OUTPATIENT_CLINIC_OR_DEPARTMENT_OTHER): Payer: No Typology Code available for payment source | Admitting: Hematology

## 2019-08-13 ENCOUNTER — Other Ambulatory Visit: Payer: Self-pay

## 2019-08-13 ENCOUNTER — Inpatient Hospital Stay: Payer: No Typology Code available for payment source | Attending: Hematology

## 2019-08-13 VITALS — BP 119/78 | HR 96 | Temp 98.2°F | Resp 18 | Ht 68.0 in | Wt 285.4 lb

## 2019-08-13 DIAGNOSIS — B029 Zoster without complications: Secondary | ICD-10-CM

## 2019-08-13 DIAGNOSIS — K760 Fatty (change of) liver, not elsewhere classified: Secondary | ICD-10-CM | POA: Insufficient documentation

## 2019-08-13 DIAGNOSIS — C8308 Small cell B-cell lymphoma, lymph nodes of multiple sites: Secondary | ICD-10-CM

## 2019-08-13 DIAGNOSIS — Z79899 Other long term (current) drug therapy: Secondary | ICD-10-CM | POA: Diagnosis not present

## 2019-08-13 DIAGNOSIS — I10 Essential (primary) hypertension: Secondary | ICD-10-CM | POA: Diagnosis not present

## 2019-08-13 DIAGNOSIS — I2721 Secondary pulmonary arterial hypertension: Secondary | ICD-10-CM | POA: Insufficient documentation

## 2019-08-13 DIAGNOSIS — C9190 Lymphoid leukemia, unspecified not having achieved remission: Secondary | ICD-10-CM | POA: Insufficient documentation

## 2019-08-13 DIAGNOSIS — G4730 Sleep apnea, unspecified: Secondary | ICD-10-CM | POA: Insufficient documentation

## 2019-08-13 LAB — CMP (CANCER CENTER ONLY)
ALT: 51 U/L — ABNORMAL HIGH (ref 0–44)
AST: 39 U/L (ref 15–41)
Albumin: 4.6 g/dL (ref 3.5–5.0)
Alkaline Phosphatase: 73 U/L (ref 38–126)
Anion gap: 11 (ref 5–15)
BUN: 16 mg/dL (ref 6–20)
CO2: 23 mmol/L (ref 22–32)
Calcium: 9.2 mg/dL (ref 8.9–10.3)
Chloride: 107 mmol/L (ref 98–111)
Creatinine: 0.98 mg/dL (ref 0.61–1.24)
GFR, Est AFR Am: >60 mL/min (ref >60)
GFR, Estimated: >60 mL/min (ref >60)
Glucose, Bld: 92 mg/dL (ref 70–99)
Potassium: 3.9 mmol/L (ref 3.5–5.1)
Sodium: 141 mmol/L (ref 135–145)
Total Bilirubin: 0.6 mg/dL (ref 0.3–1.2)
Total Protein: 7.1 g/dL (ref 6.5–8.1)

## 2019-08-13 LAB — LACTATE DEHYDROGENASE: LDH: 174 U/L (ref 98–192)

## 2019-08-13 LAB — CBC WITH DIFFERENTIAL/PLATELET
Abs Immature Granulocytes: 0.01 10*3/uL (ref 0.00–0.07)
Basophils Absolute: 0 10*3/uL (ref 0.0–0.1)
Basophils Relative: 1 %
Eosinophils Absolute: 0.2 10*3/uL (ref 0.0–0.5)
Eosinophils Relative: 2 %
HCT: 42.6 % (ref 39.0–52.0)
Hemoglobin: 14.5 g/dL (ref 13.0–17.0)
Immature Granulocytes: 0 %
Lymphocytes Relative: 34 %
Lymphs Abs: 2.4 10*3/uL (ref 0.7–4.0)
MCH: 31.3 pg (ref 26.0–34.0)
MCHC: 34 g/dL (ref 30.0–36.0)
MCV: 92 fL (ref 80.0–100.0)
Monocytes Absolute: 0.8 10*3/uL (ref 0.1–1.0)
Monocytes Relative: 12 %
Neutro Abs: 3.6 10*3/uL (ref 1.7–7.7)
Neutrophils Relative %: 51 %
Platelets: 229 10*3/uL (ref 150–400)
RBC: 4.63 MIL/uL (ref 4.22–5.81)
RDW: 13.2 % (ref 11.5–15.5)
WBC: 7 10*3/uL (ref 4.0–10.5)
nRBC: 0 % (ref 0.0–0.2)

## 2019-08-13 NOTE — Progress Notes (Signed)
HEMATOLOGY/ONCOLOGY CLINIC NOTE  Date of Service: 08/13/19  Patient Care Team: [name], MD as PCP - General (Family Medicine)  CHIEF COMPLAINTS/PURPOSE OF CONSULTATION:  F/u for mx of CLL/SLL  HISTORY OF PRESENTING ILLNESS:   Jordan Schroeder is a wonderful 39 y.o. 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 He is accompanied today by his mother. The pt reports that he is doing well overall.   The pt reports that he has not had any significant medical problems in the past. He notes that on an annual physical, his PCP noticed his WBC was high, and after repeat blood tests, he was referred to Dr. Nicholas Lose. Dr. Lindi Adie has already begun the initial work ups as noted below. The pt notes that he does not notice any change in his energy levels or feeling any different over the last 6 months.   He notes that he has some concerns for sleep apnea as he snores a lot at night but does not wake up due to SOB.   Of note prior to the patient's visit today, pt has had CT C/A/P completed on 10/11/17 with results revealing Lymphadenopathy throughout the chest, abdomen, and pelvis.   On 09/21/17 the pt also had a Flow Cytometry revealing MONOCLONAL B-CELL POPULATION IDENTIFIED.  Most recent lab results (09/21/17) of CBC  is as follows: all values are WNL except for WBC at 23.4k, Lymphs Abs at 15.9k, Monocytes Abs at 1.1k. LDH 09/21/17 was WNL at 214. FISH 09/21/12 is pending.  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 CL TP53 for 17p13 revealed 100% with normal nuclei.  ATM for 11q22.3 revealed 81.67% with normal nuclei and 18.33% with positive nuclei for del 11q22.3  On review of systems, pt reports some lower abdominal pain, good energy levels, and denies fevers, chills, night sweats, weight loss, diarrhea, blood in the stools,  black stools, problems passing urine, and any other symptoms.   On PMHx the pt reports HTN. . On Social Hx the pt denies smoking at any point, any ETOH consumption, and illicit drug use.  Interval History: [name] returns today for management and evaluation of his CLL/SLL after completing 6 planned cycles of BR treatment. The patient's last visit with [place] was on 05/23/2019. The pt reports that he is doing well overall.  The pt reports that he has been eating, sleeping, and feeling well. Pt notes that he has been indulging in more fatty foods recently. Pt has started taking a 2000IU OTC Vitamin D supplement. He has been busy since returning to work.  Lab results today (08/13/19) of CBC w/diff and CMP is as follows: all values are WNL except for ALT at 51. 08/13/2019 LDH at 174  On review of systems, pt denies fevers, chills, night sweats, unexpected weight loss, sleeplessness, rash, leg swelling and any other symptoms.   MEDICAL HISTORY:  Past Medical History:  Diagnosis Date  . Hypertension     SURGICAL HISTORY: No past surgical history on file.  SOCIAL HISTORY: Social History   Socioeconomic History  . Marital status: Married    Spouse name: Not on file  . Number of children: Not on file  . Years of education: Not on file  . Highest education level: Not on file  Occupational History  . Not on file  Tobacco Use  . Smoking status: Never Smoker  . Smokeless tobacco: Never Used  Substance and Sexual Activity  . Alcohol use: Not Currently  . Drug use: Never  . Sexual activity: Not on file  Other Topics Concern  . Not on file  Social History Narrative  . Not on file   Social Determinants of Health   Financial Resource Strain:   . Difficulty of Paying Living Expenses: Not on file  Food Insecurity:   . Worried About Charity fundraiser in the Last Year: Not on file  . Ran Out of Food in the Last Year: Not on file  Transportation Needs:   . Lack of Transportation  (Medical): Not on file  . Lack of Transportation (Non-Medical): Not on file  Physical Activity:   . Days of Exercise per Week: Not on file  . Minutes of Exercise per Session: Not on file  Stress:   . Feeling of Stress : Not on file  Social Connections:   . Frequency of Communication with Friends and Family: Not on file  . Frequency of Social Gatherings with Friends and Family: Not on file  . Attends Religious Services: Not on file  . Active Member of Clubs or Organizations: Not on file  . Attends Archivist Meetings: Not on file  . Marital Status: Not on file  Intimate Partner Violence:   . Fear of Current or Ex-Partner: Not on file  . Emotionally Abused: Not on file  . Physically Abused: Not on file  . Sexually Abused: Not on file    FAMILY HISTORY: No family history on file.  ALLERGIES:  has No Known Allergies.  MEDICATIONS:  Current Outpatient Medications  Medication Sig Dispense Refill  . acyclovir (ZOVIRAX) 400 MG tablet Take 1 tablet (400 mg total) by mouth daily. 30 tablet 3  . dexamethasone (DECADRON) 4 MG tablet Take 2 tablets (8 mg total) by mouth daily. Start the day after bendamustine chemotherapy for 2 days. Take with food. 30 tablet 1  . fenofibrate micronized (LOFIBRA) 67 MG capsule Take 67 mg by mouth daily before breakfast.    . gabapentin (NEURONTIN) 300 MG capsule Take 1 capsule (300 mg total) by mouth at bedtime. 60 capsule 1  . hydrOXYzine (ATARAX/VISTARIL) 25 MG tablet Take 1 tablet (25 mg total) by mouth 3 (three) times daily as needed for anxiety or nausea. 30 tablet 0  . lidocaine (XYLOCAINE) 5 % ointment Apply 1 application topically as needed. 1-2 times daily in thin layer over painful shingles rash on chest wall 35.44 g 0  . lisinopril (PRINIVIL,ZESTRIL) 10 MG tablet Take 10 mg by mouth daily.    . ondansetron (ZOFRAN) 8 MG tablet Take 1 tablet (8 mg total) by mouth 2 (two) times daily as needed for refractory nausea / vomiting. Start on day 2  after bendamustine chemo. 30 tablet 1  . oxyCODONE-acetaminophen (PERCOCET/ROXICET) 5-325 MG tablet Take 1 tablet by mouth every 4 (four) hours as needed for severe pain (severe pain from Shingles rash). 30 tablet 0  . prochlorperazine (COMPAZINE) 10 MG tablet Take 1 tablet (10 mg total) by mouth every 6 (six) hours as needed (Nausea or vomiting). 30 tablet 1  . sertraline (ZOLOFT) 100 MG tablet Take 100 mg by mouth daily.    . valACYclovir (VALTREX) 500 MG tablet Take 1 tablet (500 mg total) by mouth 2 (two) times daily. 60 tablet 2   No current facility-administered medications for this visit.    REVIEW  OF SYSTEMS:   A 10+ POINT REVIEW OF SYSTEMS WAS OBTAINED including neurology, dermatology, psychiatry, cardiac, respiratory, lymph, extremities, GI, GU, Musculoskeletal, constitutional, breasts, reproductive, HEENT.  All pertinent positives are noted in the HPI.  All others are negative.    PHYSICAL EXAMINATION: There were no vitals filed for this visit. Wt Readings from Last 3 Encounters:  05/13/19 285 lb 9.6 oz (129.5 kg)  01/07/19 283 lb 6.4 oz (128.5 kg)  09/06/18 265 lb 11.2 oz (120.5 kg)   There is no height or weight on file to calculate BMI.    GENERAL:alert, in no acute distress and comfortable SKIN: no acute rashes, no significant lesions, pigment changes along the right chest wall EYES: conjunctiva are pink and non-injected, sclera anicteric OROPHARYNX: MMM, no exudates, no oropharyngeal erythema or ulceration NECK: supple, no JVD LYMPH:  no palpable lymphadenopathy in the cervical, axillary or inguinal regions LUNGS: clear to auscultation b/l with normal respiratory effort HEART: regular rate & rhythm ABDOMEN:  normoactive bowel sounds , non tender, not distended. No palpable hepatosplenomegaly.  Extremity: no pedal edema PSYCH: alert & oriented x 3 with fluent speech NEURO: no focal motor/sensory deficits  LABORATORY DATA:  I have reviewed the data as listed  . CBC  Latest Ref Rng & Units 05/13/2019 01/07/2019 09/06/2018  WBC 4.0 - 10.5 K/uL 6.0 6.2 2.5(L)  Hemoglobin 13.0 - 17.0 g/dL 15.2 14.8 14.2  Hematocrit 39.0 - 52.0 % 44.4 42.5 43.3  Platelets 150 - 400 K/uL 223 211 189    . CMP Latest Ref Rng & Units 05/13/2019 01/07/2019 09/06/2018  Glucose 70 - 99 mg/dL 134(H) 143(H) 96  BUN 6 - 20 mg/dL '13 12 18  ' Creatinine 0.61 - 1.24 mg/dL 0.84 0.86 0.80  Sodium 135 - 145 mmol/L 140 141 138  Potassium 3.5 - 5.1 mmol/L 4.1 3.9 4.3  Chloride 98 - 111 mmol/L 105 108 105  CO2 22 - 32 mmol/L '24 23 27  ' Calcium 8.9 - 10.3 mg/dL 9.6 9.2 9.5  Total Protein 6.5 - 8.1 g/dL 7.2 7.1 7.1  Total Bilirubin 0.3 - 1.2 mg/dL 0.5 0.5 0.8  Alkaline Phos 38 - 126 U/L 75 78 62  AST 15 - 41 U/L 24 23 33  ALT 0 - 44 U/L 32 27 36   Component     Latest Ref Rng & Units 10/30/2017  HIV Screen 4th Generation wRfx     Non Reactive Non Reactive  HCV Ab     0.0 - 0.9 s/co ratio <0.1  Hepatitis B Surface Ag     Negative Negative  Hep B Core Ab, Tot     Negative Negative      10/25/17 Tissue Flow Cytometry:    10/25/17 Needle/core Bx:   FISH Oncology  Order: [id number]  Status:  Final result Visible to patient:  No (Not Released) Next appt:  11/07/2017 at 09:00 AM in Radiology ([facility] PET) Dx:  Lymphocytosis  Component 35moago  Specimen Type Comment:   Comment: BLOOD  Cells Counted 200   Cells Analyzed 200   FISH Result Comment:   Comment: NORMAL: NO CYCLIN D1 OR IGH GENE REARRANGEMENT OBSERVED  Interpretation Comment:   Comment: (NOTE)        nuc ish 11q13(CCND1x2),14q32(IGHx2)[200]    The fluorescence in situ hybridization (FISH) result  was normal. Dual color dual fusion DNA FISH probes targeting  the cyclin D1 gene (CCND1 or BCL1)([employer] and the  heavy chain immunoglobulin gene (IgH) at 14q32 showed two  hybridization signals each with no fusion in all cells  examined. Thus, there was NO apparent rearrangement of the  primary genes involved with  mantle cell lymphoma, and to a  lesser extent myeloma. Marland Kitchen         RADIOGRAPHIC STUDIES: I have personally reviewed the radiological images as listed and agreed with the findings in the report. No results found.  ASSESSMENT & PLAN:   39 y.o. male with  1. 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  -Reviewed 10/11/17 CT of C/A/P which revealed lymphadenopathy throughout the chest, abdomen and pelvis. Most notably in the axilla and abdomen.   -Noted 13q deletion: 13q14.3 is at 47.67% for normal nuclei, and 06.00% with mono allelic deletion of [id number].  -ATM for 11q22.3 revealed 81.67% with normal nuclei and 18.33% with positive nuclei for del 11q22.3 ---> point more to CLL/SLL   02/26/18 CT C/A/P revealed  Marked response to therapy as evidenced by decrease in size or resolution of adenopathy throughout chest, abdomen and pelvis. 2. Hepatic steatosis. Slight marginal irregularity suggests cirrhosis. 3. Enlarged pulmonary arteries, indicative of pulmonary arterial hypertension.   S/p 6 cycles of BR completed on 04/19/18  06/04/18 CT C/A/P revealed Stable CT chest without thoracic lymphadenopathy. Continued further decrease in abdominal and pelvic lymphadenopathy. No new or progressive lymphadenopathy on today's exam. 2. Hepatic steatosis. Subtle nodularity of liver contour raises the question of cirrhosis. 3. Pulmonary arterial enlargement. Pulmonary arterial hypertension a distinct concern.  2. H/o Rituxan infusion reaction-  -Had significant flushing with rituxan needing steroids. Not a candidate in future for rapid infusion protocol.  3. Sleep apnea Notes he had a sleep study since his last clinic visit and diagnosis was confirmed. -Pt now uses CPAP  PLAN: -Discussed pt  labwork today, 08/13/19; ALT slightly elevated, other blood counts and chemistries are nml -Discussed 08/13/2019 LDH at 174 -Recommend pt receive the COVID19 vaccine when available -Will see back in 4 months with labs, sooner if any changes in symptomology   FOLLOW UP: RTC with Dr Irene Limbo in 4 months with labs   The total time spent in the appt was 15 minutes and more than 50% was on counseling and direct patient cares.  All of the patient's questions were answered with apparent satisfaction. The patient knows to call the clinic with any problems, questions or concerns.   Sullivan Lone MD [place] AAHIVMS [facility] Hematology/Oncology Physician [facility]  (Office):       [phone] (Work cell):  [phone] (Fax):           [phone]  08/13/2019 8:52 AM   I, Yevette Edwards, am acting as a scribe for Dr. Sullivan Lone.   .I have reviewed the above documentation for accuracy and completeness, and I agree with the above. Brunetta Genera MD

## 2019-08-15 ENCOUNTER — Telehealth: Payer: Self-pay | Admitting: Hematology

## 2019-08-15 NOTE — Telephone Encounter (Signed)
Scheduled per 02/09 los, patient has been called and notified.  °

## 2019-12-11 ENCOUNTER — Ambulatory Visit: Payer: No Typology Code available for payment source | Admitting: Hematology

## 2019-12-11 ENCOUNTER — Other Ambulatory Visit: Payer: No Typology Code available for payment source

## 2019-12-16 ENCOUNTER — Other Ambulatory Visit: Payer: Self-pay

## 2019-12-16 ENCOUNTER — Inpatient Hospital Stay (HOSPITAL_BASED_OUTPATIENT_CLINIC_OR_DEPARTMENT_OTHER): Payer: No Typology Code available for payment source | Admitting: Hematology

## 2019-12-16 ENCOUNTER — Inpatient Hospital Stay: Payer: No Typology Code available for payment source | Attending: Hematology

## 2019-12-16 VITALS — BP 114/79 | HR 89 | Temp 97.9°F | Resp 20 | Ht 68.0 in | Wt 267.7 lb

## 2019-12-16 DIAGNOSIS — C8308 Small cell B-cell lymphoma, lymph nodes of multiple sites: Secondary | ICD-10-CM | POA: Diagnosis not present

## 2019-12-16 DIAGNOSIS — I10 Essential (primary) hypertension: Secondary | ICD-10-CM | POA: Insufficient documentation

## 2019-12-16 DIAGNOSIS — Z87891 Personal history of nicotine dependence: Secondary | ICD-10-CM | POA: Diagnosis not present

## 2019-12-16 DIAGNOSIS — Z8572 Personal history of non-Hodgkin lymphomas: Secondary | ICD-10-CM | POA: Diagnosis present

## 2019-12-16 DIAGNOSIS — G4730 Sleep apnea, unspecified: Secondary | ICD-10-CM | POA: Diagnosis not present

## 2019-12-16 DIAGNOSIS — R109 Unspecified abdominal pain: Secondary | ICD-10-CM | POA: Insufficient documentation

## 2019-12-16 DIAGNOSIS — K760 Fatty (change of) liver, not elsewhere classified: Secondary | ICD-10-CM | POA: Insufficient documentation

## 2019-12-16 LAB — CBC WITH DIFFERENTIAL/PLATELET
Abs Immature Granulocytes: 0.02 10*3/uL (ref 0.00–0.07)
Basophils Absolute: 0 10*3/uL (ref 0.0–0.1)
Basophils Relative: 1 %
Eosinophils Absolute: 0.2 10*3/uL (ref 0.0–0.5)
Eosinophils Relative: 3 %
HCT: 42.6 % (ref 39.0–52.0)
Hemoglobin: 14.6 g/dL (ref 13.0–17.0)
Immature Granulocytes: 0 %
Lymphocytes Relative: 37 %
Lymphs Abs: 2.2 10*3/uL (ref 0.7–4.0)
MCH: 30.2 pg (ref 26.0–34.0)
MCHC: 34.3 g/dL (ref 30.0–36.0)
MCV: 88.2 fL (ref 80.0–100.0)
Monocytes Absolute: 0.6 10*3/uL (ref 0.1–1.0)
Monocytes Relative: 10 %
Neutro Abs: 3 10*3/uL (ref 1.7–7.7)
Neutrophils Relative %: 49 %
Platelets: 207 10*3/uL (ref 150–400)
RBC: 4.83 MIL/uL (ref 4.22–5.81)
RDW: 12.3 % (ref 11.5–15.5)
WBC: 6 10*3/uL (ref 4.0–10.5)
nRBC: 0 % (ref 0.0–0.2)

## 2019-12-16 LAB — CMP (CANCER CENTER ONLY)
ALT: 45 U/L — ABNORMAL HIGH (ref 0–44)
AST: 35 U/L (ref 15–41)
Albumin: 4.5 g/dL (ref 3.5–5.0)
Alkaline Phosphatase: 85 U/L (ref 38–126)
Anion gap: 10 (ref 5–15)
BUN: 12 mg/dL (ref 6–20)
CO2: 26 mmol/L (ref 22–32)
Calcium: 9.7 mg/dL (ref 8.9–10.3)
Chloride: 105 mmol/L (ref 98–111)
Creatinine: 0.96 mg/dL (ref 0.61–1.24)
GFR, Est AFR Am: >60 mL/min (ref >60)
GFR, Estimated: >60 mL/min (ref >60)
Glucose, Bld: 102 mg/dL — ABNORMAL HIGH (ref 70–99)
Potassium: 4.3 mmol/L (ref 3.5–5.1)
Sodium: 141 mmol/L (ref 135–145)
Total Bilirubin: 0.5 mg/dL (ref 0.3–1.2)
Total Protein: 7 g/dL (ref 6.5–8.1)

## 2019-12-16 LAB — LACTATE DEHYDROGENASE: LDH: 179 U/L (ref 98–192)

## 2019-12-16 NOTE — Progress Notes (Signed)
HEMATOLOGY/ONCOLOGY CLINIC NOTE  Date of Service: 12/16/19  Patient Care Team: [name], MD as PCP - General (Family Medicine)  CHIEF COMPLAINTS/PURPOSE OF CONSULTATION:  F/u for mx of CLL/SLL  HISTORY OF PRESENTING ILLNESS:   Jordan Schroeder is a wonderful 39 y.o. 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 He is accompanied today by his mother. The pt reports that he is doing well overall.   The pt reports that he has not had any significant medical problems in the past. He notes that on an annual physical, his PCP noticed his WBC was high, and after repeat blood tests, he was referred to Dr. Nicholas Lose. Dr. Lindi Adie has already begun the initial work ups as noted below. The pt notes that he does not notice any change in his energy levels or feeling any different over the last 6 months.   He notes that he has some concerns for sleep apnea as he snores a lot at night but does not wake up due to SOB.   Of note prior to the patient's visit today, pt has had CT C/A/P completed on 10/11/17 with results revealing Lymphadenopathy throughout the chest, abdomen, and pelvis.   On 09/21/17 the pt also had a Flow Cytometry revealing MONOCLONAL B-CELL POPULATION IDENTIFIED.  Most recent lab results (09/21/17) of CBC  is as follows: all values are WNL except for WBC at 23.4k, Lymphs Abs at 15.9k, Monocytes Abs at 1.1k. LDH 09/21/17 was WNL at 214. FISH 09/21/12 is pending.  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 CL TP53 for 17p13 revealed 100% with normal nuclei.  ATM for 11q22.3 revealed 81.67% with normal nuclei and 18.33% with positive nuclei for del 11q22.3  On review of systems, pt reports some lower abdominal pain, good energy levels, and denies fevers, chills, night sweats, weight loss, diarrhea, blood in the stools,  black stools, problems passing urine, and any other symptoms.   On PMHx the pt reports HTN. . On Social Hx the pt denies smoking at any point, any ETOH consumption, and illicit drug use.  Interval History: Jordan Schroeder returns today for management and evaluation of his CLL/SLL after completing 6 planned cycles of BR treatment. The patient's last visit with [place] was on 08/13/2019. The pt reports that he is doing well overall.  The pt reports that he has been active with work and has no new concerns. Pt is working on losing weight and has continued taking a Vitamin D supplement.   Lab results today (12/16/19) of CBC w/diff and CMP is as follows: all values are WNL except for Glucose at 102, ALT at 45.  On review of systems, pt denies fevers, chills, night sweats, abdominal pain, new lumps/bumps, unexpected weight loss, testicular pain/swelling and any other symptoms.   MEDICAL HISTORY:  Past Medical History:  Diagnosis Date  . Hypertension     SURGICAL HISTORY: No past surgical history on file.  SOCIAL HISTORY: Social History   Socioeconomic History  . Marital status: Married    Spouse name: Not on file  . Number of children: Not on file  . Years of education: Not on file  . Highest education level: Not on file  Occupational History  . Not on file  Tobacco Use  . Smoking status: Never Smoker  . Smokeless  tobacco: Never Used  Vaping Use  . Vaping Use: Never used  Substance and Sexual Activity  . Alcohol use: Not Currently  . Drug use: Never  . Sexual activity: Not on file  Other Topics Concern  . Not on file  Social History Narrative  . Not on file   Social Determinants of Health   Financial Resource Strain:   . Difficulty of Paying Living Expenses:   Food Insecurity:   . Worried About Charity fundraiser in the Last Year:   . Arboriculturist in the Last Year:   Transportation Needs:   . Film/video editor (Medical):   Marland Kitchen Lack of Transportation (Non-Medical):    Physical Activity:   . Days of Exercise per Week:   . Minutes of Exercise per Session:   Stress:   . Feeling of Stress :   Social Connections:   . Frequency of Communication with Friends and Family:   . Frequency of Social Gatherings with Friends and Family:   . Attends Religious Services:   . Active Member of Clubs or Organizations:   . Attends Archivist Meetings:   Marland Kitchen Marital Status:   Intimate Partner Violence:   . Fear of Current or Ex-Partner:   . Emotionally Abused:   Marland Kitchen Physically Abused:   . Sexually Abused:     FAMILY HISTORY: No family history on file.  ALLERGIES:  has No Known Allergies.  MEDICATIONS:  Current Outpatient Medications  Medication Sig Dispense Refill  . acyclovir (ZOVIRAX) 400 MG tablet Take 1 tablet (400 mg total) by mouth daily. (Patient not taking: Reported on 08/13/2019) 30 tablet 3  . dexamethasone (DECADRON) 4 MG tablet Take 2 tablets (8 mg total) by mouth daily. Start the day after bendamustine chemotherapy for 2 days. Take with food. (Patient not taking: Reported on 08/13/2019) 30 tablet 1  . fenofibrate micronized (LOFIBRA) 67 MG capsule Take 67 mg by mouth daily before breakfast.    . gabapentin (NEURONTIN) 300 MG capsule Take 1 capsule (300 mg total) by mouth at bedtime. (Patient not taking: Reported on 08/13/2019) 60 capsule 1  . hydrOXYzine (ATARAX/VISTARIL) 25 MG tablet Take 1 tablet (25 mg total) by mouth 3 (three) times daily as needed for anxiety or nausea. (Patient not taking: Reported on 08/13/2019) 30 tablet 0  . lidocaine (XYLOCAINE) 5 % ointment Apply 1 application topically as needed. 1-2 times daily in thin layer over painful shingles rash on chest wall (Patient not taking: Reported on 08/13/2019) 35.44 g 0  . lisinopril (PRINIVIL,ZESTRIL) 10 MG tablet Take 10 mg by mouth daily.    Marland Kitchen oxyCODONE-acetaminophen (PERCOCET/ROXICET) 5-325 MG tablet Take 1 tablet by mouth every 4 (four) hours as needed for severe pain (severe pain from  Shingles rash). (Patient not taking: Reported on 08/13/2019) 30 tablet 0  . prochlorperazine (COMPAZINE) 10 MG tablet Take 1 tablet (10 mg total) by mouth every 6 (six) hours as needed (Nausea or vomiting). (Patient not taking: Reported on 08/13/2019) 30 tablet 1  . sertraline (ZOLOFT) 100 MG tablet Take 100 mg by mouth daily.    . valACYclovir (VALTREX) 500 MG tablet Take 1 tablet (500 mg total) by mouth 2 (two) times daily. (Patient not taking: Reported on 08/13/2019) 60 tablet 2  . VITAMIN D PO Take 2,000 Units by mouth.     No current facility-administered medications for this visit.    REVIEW OF SYSTEMS:   A 10+ POINT REVIEW OF SYSTEMS WAS OBTAINED including neurology,  dermatology, psychiatry, cardiac, respiratory, lymph, extremities, GI, GU, Musculoskeletal, constitutional, breasts, reproductive, HEENT.  All pertinent positives are noted in the HPI.  All others are negative.   PHYSICAL EXAMINATION: Vitals:   12/16/19 1432  BP: 114/79  Pulse: 89  Resp: 20  Temp: 97.9 F (36.6 C)  SpO2: 97%   Wt Readings from Last 3 Encounters:  12/16/19 267 lb 11.2 oz (121.4 kg)  08/13/19 285 lb 6.4 oz (129.5 kg)  05/13/19 285 lb 9.6 oz (129.5 kg)   Body mass index is 40.7 kg/m.    GENERAL:alert, in no acute distress and comfortable SKIN: no acute rashes, no significant lesions EYES: conjunctiva are pink and non-injected, sclera anicteric OROPHARYNX: MMM, no exudates, no oropharyngeal erythema or ulceration NECK: supple, no JVD LYMPH:  no palpable lymphadenopathy in the cervical, axillary or inguinal regions LUNGS: clear to auscultation b/l with normal respiratory effort HEART: regular rate & rhythm ABDOMEN:  normoactive bowel sounds , non tender, not distended. No palpable hepatosplenomegaly.  Extremity: no pedal edema PSYCH: alert & oriented x 3 with fluent speech NEURO: no focal motor/sensory deficits  LABORATORY DATA:  I have reviewed the data as listed  . CBC Latest Ref Rng & Units  12/16/2019 08/13/2019 05/13/2019  WBC 4.0 - 10.5 K/uL 6.0 7.0 6.0  Hemoglobin 13.0 - 17.0 g/dL 14.6 14.5 15.2  Hematocrit 39 - 52 % 42.6 42.6 44.4  Platelets 150 - 400 K/uL 207 229 223    . CMP Latest Ref Rng & Units 12/16/2019 08/13/2019 05/13/2019  Glucose 70 - 99 mg/dL 102(H) 92 134(H)  BUN 6 - 20 mg/dL '12 16 13  ' Creatinine 0.61 - 1.24 mg/dL 0.96 0.98 0.84  Sodium 135 - 145 mmol/L 141 141 140  Potassium 3.5 - 5.1 mmol/L 4.3 3.9 4.1  Chloride 98 - 111 mmol/L 105 107 105  CO2 22 - 32 mmol/L '26 23 24  ' Calcium 8.9 - 10.3 mg/dL 9.7 9.2 9.6  Total Protein 6.5 - 8.1 g/dL 7.0 7.1 7.2  Total Bilirubin 0.3 - 1.2 mg/dL 0.5 0.6 0.5  Alkaline Phos 38 - 126 U/L 85 73 75  AST 15 - 41 U/L 35 39 24  ALT 0 - 44 U/L 45(H) 51(H) 32   Component     Latest Ref Rng & Units 10/30/2017  HIV Screen 4th Generation wRfx     Non Reactive Non Reactive  HCV Ab     0.0 - 0.9 s/co ratio <0.1  Hepatitis B Surface Ag     Negative Negative  Hep B Core Ab, Tot     Negative Negative      10/25/17 Tissue Flow Cytometry:    10/25/17 Needle/core Bx:   FISH Oncology  Order: [id number]  Status:  Final result Visible to patient:  No (Not Released) Next appt:  11/07/2017 at 09:00 AM in Radiology ([facility] PET) Dx:  Lymphocytosis  Component [id number]  Specimen Type Comment:   Comment: BLOOD  Cells Counted 200   Cells Analyzed 200   FISH Result Comment:   Comment: NORMAL: NO CYCLIN D1 OR IGH GENE REARRANGEMENT OBSERVED  Interpretation Comment:   Comment: (NOTE)        nuc ish 11q13(CCND1x2),14q32(IGHx2)[200]    The fluorescence in situ hybridization (FISH) result  was normal. Dual color dual fusion DNA FISH probes targeting  the cyclin D1 gene (CCND1 or BCL1)([employer] and the  heavy chain immunoglobulin gene (IgH) at 14q32 showed two  hybridization signals each with no fusion in all cells  examined. Thus, there was NO apparent rearrangement of the  primary genes involved with mantle cell lymphoma,  and to a  lesser extent myeloma. Marland Kitchen         RADIOGRAPHIC STUDIES: I have personally reviewed the radiological images as listed and agreed with the findings in the report. No results found.  ASSESSMENT & PLAN:   39 y.o. male with  1. 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  -Reviewed 10/11/17 CT of C/A/P which revealed lymphadenopathy throughout the chest, abdomen and pelvis. Most notably in the axilla and abdomen.   -Noted 13q deletion: 13q14.3 is at 47.67% for normal nuclei, and 73.56% with mono allelic deletion of [id number].  -ATM for 11q22.3 revealed 81.67% with normal nuclei and 18.33% with positive nuclei for del 11q22.3 ---> point more to CLL/SLL   02/26/18 CT C/A/P revealed  Marked response to therapy as evidenced by decrease in size or resolution of adenopathy throughout chest, abdomen and pelvis. 2. Hepatic steatosis. Slight marginal irregularity suggests cirrhosis. 3. Enlarged pulmonary arteries, indicative of pulmonary arterial hypertension.   S/p 6 cycles of BR completed on 04/19/18  06/04/18 CT C/A/P revealed Stable CT chest without thoracic lymphadenopathy. Continued further decrease in abdominal and pelvic lymphadenopathy. No new or progressive lymphadenopathy on today's exam. 2. Hepatic steatosis. Subtle nodularity of liver contour raises the question of cirrhosis. 3. Pulmonary arterial enlargement. Pulmonary arterial hypertension a distinct concern.  2. H/o Rituxan infusion reaction-  -Had significant flushing with rituxan needing steroids. Not a candidate in future for rapid infusion protocol.  3. Sleep apnea Notes he had a sleep study since his last clinic visit and diagnosis was confirmed. -Pt now uses CPAP  PLAN: -Discussed pt labwork today, 12/16/19;  blood counts and chemistries are nml, ALT is borderline high, LDH is WNL -No lab or clinical evidence of CLL/SLL progression or recurrence at this time.  -Not unreasonable for pt to receive Shingrix vaccine. Pt has discussed with PCP and insurance will not cover due to pt's age.  -Will space out f/u due to stability of labs and symptoms.  -Will see back in 6 months with labs    FOLLOW UP: RTC with Dr Irene Limbo with labs in 6 months   The total time spent in the appt was 20 minutes and more than 50% was on counseling and direct patient cares.  All of the patient's questions were answered with apparent satisfaction. The patient knows to call the clinic with any problems, questions or concerns.   Sullivan Lone MD [place] AAHIVMS [facility] Hematology/Oncology Physician [facility]  (Office):       [phone] (Work cell):  [phone] (Fax):           [phone]  12/16/2019 2:57 PM   I, Yevette Edwards, am acting as a scribe for Dr. Sullivan Lone.   .I have reviewed the above documentation for accuracy and completeness, and I agree with the above. Brunetta Genera MD

## 2019-12-23 ENCOUNTER — Telehealth: Payer: Self-pay | Admitting: Hematology

## 2019-12-23 NOTE — Telephone Encounter (Signed)
Scheduled per los, patient has been called and notified. 

## 2020-03-12 ENCOUNTER — Other Ambulatory Visit: Payer: Self-pay | Admitting: Hematology

## 2020-04-01 ENCOUNTER — Other Ambulatory Visit: Payer: Self-pay | Admitting: Hematology

## 2020-06-17 ENCOUNTER — Other Ambulatory Visit: Payer: Self-pay

## 2020-06-17 ENCOUNTER — Inpatient Hospital Stay: Payer: No Typology Code available for payment source | Attending: Hematology

## 2020-06-17 ENCOUNTER — Inpatient Hospital Stay: Payer: No Typology Code available for payment source | Admitting: Hematology

## 2020-06-17 VITALS — BP 114/75 | HR 88 | Temp 97.7°F | Resp 18 | Ht 68.0 in | Wt 277.5 lb

## 2020-06-17 DIAGNOSIS — C9191 Lymphoid leukemia, unspecified, in remission: Secondary | ICD-10-CM | POA: Insufficient documentation

## 2020-06-17 DIAGNOSIS — C8308 Small cell B-cell lymphoma, lymph nodes of multiple sites: Secondary | ICD-10-CM

## 2020-06-17 DIAGNOSIS — Z79899 Other long term (current) drug therapy: Secondary | ICD-10-CM | POA: Diagnosis not present

## 2020-06-17 DIAGNOSIS — G4730 Sleep apnea, unspecified: Secondary | ICD-10-CM | POA: Insufficient documentation

## 2020-06-17 DIAGNOSIS — I10 Essential (primary) hypertension: Secondary | ICD-10-CM | POA: Diagnosis not present

## 2020-06-17 LAB — CBC WITH DIFFERENTIAL/PLATELET
Abs Immature Granulocytes: 0.02 10*3/uL (ref 0.00–0.07)
Basophils Absolute: 0.1 10*3/uL (ref 0.0–0.1)
Basophils Relative: 1 %
Eosinophils Absolute: 0.2 10*3/uL (ref 0.0–0.5)
Eosinophils Relative: 2 %
HCT: 42.7 % (ref 39.0–52.0)
Hemoglobin: 14.2 g/dL (ref 13.0–17.0)
Immature Granulocytes: 0 %
Lymphocytes Relative: 54 %
Lymphs Abs: 5.1 10*3/uL — ABNORMAL HIGH (ref 0.7–4.0)
MCH: 30.1 pg (ref 26.0–34.0)
MCHC: 33.3 g/dL (ref 30.0–36.0)
MCV: 90.7 fL (ref 80.0–100.0)
Monocytes Absolute: 0.9 10*3/uL (ref 0.1–1.0)
Monocytes Relative: 10 %
Neutro Abs: 3.1 10*3/uL (ref 1.7–7.7)
Neutrophils Relative %: 33 %
Platelets: 223 10*3/uL (ref 150–400)
RBC: 4.71 MIL/uL (ref 4.22–5.81)
RDW: 12.5 % (ref 11.5–15.5)
WBC: 9.4 10*3/uL (ref 4.0–10.5)
nRBC: 0 % (ref 0.0–0.2)

## 2020-06-17 LAB — CMP (CANCER CENTER ONLY)
ALT: 31 U/L (ref 0–44)
AST: 29 U/L (ref 15–41)
Albumin: 4.4 g/dL (ref 3.5–5.0)
Alkaline Phosphatase: 75 U/L (ref 38–126)
Anion gap: 8 (ref 5–15)
BUN: 16 mg/dL (ref 6–20)
CO2: 26 mmol/L (ref 22–32)
Calcium: 9.5 mg/dL (ref 8.9–10.3)
Chloride: 106 mmol/L (ref 98–111)
Creatinine: 0.94 mg/dL (ref 0.61–1.24)
GFR, Estimated: >60 mL/min (ref >60)
Glucose, Bld: 95 mg/dL (ref 70–99)
Potassium: 4.2 mmol/L (ref 3.5–5.1)
Sodium: 140 mmol/L (ref 135–145)
Total Bilirubin: 0.9 mg/dL (ref 0.3–1.2)
Total Protein: 6.9 g/dL (ref 6.5–8.1)

## 2020-06-17 LAB — LACTATE DEHYDROGENASE: LDH: 206 U/L — ABNORMAL HIGH (ref 98–192)

## 2020-06-17 NOTE — Progress Notes (Signed)
HEMATOLOGY/ONCOLOGY CLINIC NOTE  Date of Service: 06/17/20  Patient Care Team: [name], MD as PCP - General (Family Medicine)  CHIEF COMPLAINTS/PURPOSE OF CONSULTATION:  F/u for mx of CLL/SLL  HISTORY OF PRESENTING ILLNESS:   Jordan Schroeder is a wonderful 39 y.o. 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 He is accompanied today by his mother. The pt reports that he is doing well overall.   The pt reports that he has not had any significant medical problems in the past. He notes that on an annual physical, his PCP noticed his WBC was high, and after repeat blood tests, he was referred to Dr. Nicholas Lose. Dr. Lindi Adie has already begun the initial work ups as noted below. The pt notes that he does not notice any change in his energy levels or feeling any different over the last 6 months.   He notes that he has some concerns for sleep apnea as he snores a lot at night but does not wake up due to SOB.   Of note prior to the patient's visit today, pt has had CT C/A/P completed on 10/11/17 with results revealing Lymphadenopathy throughout the chest, abdomen, and pelvis.   On 09/21/17 the pt also had a Flow Cytometry revealing MONOCLONAL B-CELL POPULATION IDENTIFIED.  Most recent lab results (09/21/17) of CBC  is as follows: all values are WNL except for WBC at 23.4k, Lymphs Abs at 15.9k, Monocytes Abs at 1.1k. LDH 09/21/17 was WNL at 214. FISH 09/21/12 is pending.  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 CL TP53 for 17p13 revealed 100% with normal nuclei.  ATM for 11q22.3 revealed 81.67% with normal nuclei and 18.33% with positive nuclei for del 11q22.3  On review of systems, pt reports some lower abdominal pain, good energy levels, and denies fevers, chills, night sweats, weight loss, diarrhea, blood in the stools,  black stools, problems passing urine, and any other symptoms.   On PMHx the pt reports HTN. . On Social Hx the pt denies smoking at any point, any ETOH consumption, and illicit drug use.  Interval History: Jordan Schroeder returns today for management and evaluation of his CLL/SLL after completing 6 planned cycles of BR treatment. The patient's last visit with [place] was on 12/16/2019. The pt reports that he is doing well overall.  The pt reports that he has been well and has had no new symptoms. Pt received his COVID19 vaccines but has not gotten his booster. He denies any recent infections or any residual Shingles sores.  Lab results today (06/17/20) of CBC w/diff and CMP is as follows: all values are WNL except for Lymphs Abs at 5.1K. 06/17/2020 LDH at 206  On review of systems, pt denies fevers, chills, night sweats, new lumps/bumps, fatigue, leg swelling, abdominal pain, back pain and any other symptoms.   MEDICAL HISTORY:  Past Medical History:  Diagnosis Date  . Hypertension     SURGICAL HISTORY: No past surgical history on file.  SOCIAL HISTORY: Social History   Socioeconomic History  . Marital status: Married    Spouse name: Not on file  . Number of children: Not on file  . Years of education: Not on file  . Highest education level: Not on file  Occupational History  . Not on file  Tobacco Use  .  Smoking status: Never Smoker  . Smokeless tobacco: Never Used  Vaping Use  . Vaping Use: Never used  Substance and Sexual Activity  . Alcohol use: Not Currently  . Drug use: Never  . Sexual activity: Not on file  Other Topics Concern  . Not on file  Social History Narrative  . Not on file   Social Determinants of Health   Financial Resource Strain: Not on file  Food Insecurity: Not on file  Transportation Needs: Not on file  Physical Activity: Not on file  Stress: Not on file  Social Connections: Not on file  Intimate Partner Violence: Not on file    FAMILY  HISTORY: No family history on file.  ALLERGIES:  has No Known Allergies.  MEDICATIONS:  Current Outpatient Medications  Medication Sig Dispense Refill  . acyclovir (ZOVIRAX) 400 MG tablet Take 1 tablet (400 mg total) by mouth daily. (Patient not taking: Reported on 08/13/2019) 30 tablet 3  . dexamethasone (DECADRON) 4 MG tablet Take 2 tablets (8 mg total) by mouth daily. Start the day after bendamustine chemotherapy for 2 days. Take with food. (Patient not taking: Reported on 08/13/2019) 30 tablet 1  . fenofibrate micronized (LOFIBRA) 67 MG capsule Take 67 mg by mouth daily before breakfast.    . gabapentin (NEURONTIN) 300 MG capsule Take 1 capsule (300 mg total) by mouth at bedtime. (Patient not taking: Reported on 08/13/2019) 60 capsule 1  . hydrOXYzine (ATARAX/VISTARIL) 25 MG tablet Take 1 tablet (25 mg total) by mouth 3 (three) times daily as needed for anxiety or nausea. (Patient not taking: Reported on 08/13/2019) 30 tablet 0  . lidocaine (XYLOCAINE) 5 % ointment Apply 1 application topically as needed. 1-2 times daily in thin layer over painful shingles rash on chest wall (Patient not taking: Reported on 08/13/2019) 35.44 g 0  . lisinopril (PRINIVIL,ZESTRIL) 10 MG tablet Take 10 mg by mouth daily.    Marland Kitchen oxyCODONE-acetaminophen (PERCOCET/ROXICET) 5-325 MG tablet Take 1 tablet by mouth every 4 (four) hours as needed for severe pain (severe pain from Shingles rash). (Patient not taking: Reported on 08/13/2019) 30 tablet 0  . prochlorperazine (COMPAZINE) 10 MG tablet Take 1 tablet (10 mg total) by mouth every 6 (six) hours as needed (Nausea or vomiting). (Patient not taking: Reported on 08/13/2019) 30 tablet 1  . sertraline (ZOLOFT) 100 MG tablet Take 100 mg by mouth daily.    . valACYclovir (VALTREX) 500 MG tablet Take 1 tablet (500 mg total) by mouth 2 (two) times daily. (Patient not taking: Reported on 08/13/2019) 60 tablet 2  . VITAMIN D PO Take 2,000 Units by mouth.     No current facility-administered  medications for this visit.    REVIEW OF SYSTEMS:   A 10+ POINT REVIEW OF SYSTEMS WAS OBTAINED including neurology, dermatology, psychiatry, cardiac, respiratory, lymph, extremities, GI, GU, Musculoskeletal, constitutional, breasts, reproductive, HEENT.  All pertinent positives are noted in the HPI.  All others are negative.   PHYSICAL EXAMINATION: Vitals:   06/17/20 1102  BP: 114/75  Pulse: 88  Resp: 18  Temp: 97.7 F (36.5 C)  SpO2: 98%   Wt Readings from Last 3 Encounters:  06/17/20 277 lb 8 oz (125.9 kg)  12/16/19 267 lb 11.2 oz (121.4 kg)  08/13/19 285 lb 6.4 oz (129.5 kg)   Body mass index is 42.19 kg/m.    GENERAL:alert, in no acute distress and comfortable SKIN: no acute rashes, no significant lesions EYES: conjunctiva are pink and non-injected, sclera anicteric OROPHARYNX:  MMM, no exudates, no oropharyngeal erythema or ulceration NECK: supple, no JVD LYMPH:  no palpable lymphadenopathy in the cervical, axillary or inguinal regions LUNGS: clear to auscultation b/l with normal respiratory effort HEART: regular rate & rhythm ABDOMEN:  normoactive bowel sounds , non tender, not distended. No palpable hepatosplenomegaly.  Extremity: no pedal edema PSYCH: alert & oriented x 3 with fluent speech NEURO: no focal motor/sensory deficits  LABORATORY DATA:  I have reviewed the data as listed  . CBC Latest Ref Rng & Units 06/17/2020 12/16/2019 08/13/2019  WBC 4.0 - 10.5 K/uL 9.4 6.0 7.0  Hemoglobin 13.0 - 17.0 g/dL 14.2 14.6 14.5  Hematocrit 39.0 - 52.0 % 42.7 42.6 42.6  Platelets 150 - 400 K/uL 223 207 229    . CMP Latest Ref Rng & Units 06/17/2020 12/16/2019 08/13/2019  Glucose 70 - 99 mg/dL 95 102(H) 92  BUN 6 - 20 mg/dL '16 12 16  ' Creatinine 0.61 - 1.24 mg/dL 0.94 0.96 0.98  Sodium 135 - 145 mmol/L 140 141 141  Potassium 3.5 - 5.1 mmol/L 4.2 4.3 3.9  Chloride 98 - 111 mmol/L 106 105 107  CO2 22 - 32 mmol/L '26 26 23  ' Calcium 8.9 - 10.3 mg/dL 9.5 9.7 9.2  Total  Protein 6.5 - 8.1 g/dL 6.9 7.0 7.1  Total Bilirubin 0.3 - 1.2 mg/dL 0.9 0.5 0.6  Alkaline Phos 38 - 126 U/L 75 85 73  AST 15 - 41 U/L 29 35 39  ALT 0 - 44 U/L 31 45(H) 51(H)   Component     Latest Ref Rng & Units 10/30/2017  HIV Screen 4th Generation wRfx     Non Reactive Non Reactive  HCV Ab     0.0 - 0.9 s/co ratio <0.1  Hepatitis B Surface Ag     Negative Negative  Hep B Core Ab, Tot     Negative Negative      10/25/17 Tissue Flow Cytometry:    10/25/17 Needle/core Bx:   FISH Oncology  Order: [id number]  Status:  Final result Visible to patient:  No (Not Released) Next appt:  11/07/2017 at 09:00 AM in Radiology ([facility] PET) Dx:  Lymphocytosis  Component 12moago  Specimen Type Comment:   Comment: BLOOD  Cells Counted 200   Cells Analyzed 200   FISH Result Comment:   Comment: NORMAL: NO CYCLIN D1 OR IGH GENE REARRANGEMENT OBSERVED  Interpretation Comment:   Comment: (NOTE)        nuc ish 11q13(CCND1x2),14q32(IGHx2)[200]    The fluorescence in situ hybridization (FISH) result  was normal. Dual color dual fusion DNA FISH probes targeting  the cyclin D1 gene (CCND1 or BCL1)([employer] and the  heavy chain immunoglobulin gene (IgH) at 14q32 showed two  hybridization signals each with no fusion in all cells  examined. Thus, there was NO apparent rearrangement of the  primary genes involved with mantle cell lymphoma, and to a  lesser extent myeloma. .Marland Kitchen        RADIOGRAPHIC STUDIES: I have personally reviewed the radiological images as listed and agreed with the findings in the report. No results found.  ASSESSMENT & PLAN:   39y.o. male with  1. 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  -Reviewed 10/11/17 CT of C/A/P which revealed  lymphadenopathy throughout  the chest, abdomen and pelvis. Most notably in the axilla and abdomen.   -Noted 13q deletion: 13q14.3 is at 47.67% for normal nuclei, and 90.30% with mono allelic deletion of [id number].  -ATM for 11q22.3 revealed 81.67% with normal nuclei and 18.33% with positive nuclei for del 11q22.3 ---> point more to CLL/SLL   02/26/18 CT C/A/P revealed  Marked response to therapy as evidenced by decrease in size or resolution of adenopathy throughout chest, abdomen and pelvis. 2. Hepatic steatosis. Slight marginal irregularity suggests cirrhosis. 3. Enlarged pulmonary arteries, indicative of pulmonary arterial hypertension.   S/p 6 cycles of BR completed on 04/19/18  06/04/18 CT C/A/P revealed Stable CT chest without thoracic lymphadenopathy. Continued further decrease in abdominal and pelvic lymphadenopathy. No new or progressive lymphadenopathy on today's exam. 2. Hepatic steatosis. Subtle nodularity of liver contour raises the question of cirrhosis. 3. Pulmonary arterial enlargement. Pulmonary arterial hypertension a distinct concern.  2. H/o Rituxan infusion reaction-  -Had significant flushing with rituxan needing steroids. Not a candidate in future for rapid infusion protocol.  3. Sleep apnea Notes he had a sleep study since his last clinic visit and diagnosis was confirmed. -Pt now uses CPAP  PLAN: -Discussed pt labwork today, 06/17/20; lymphocytes are borderline elevated, other blood counts and chemistries are nml, LDH is slightly elevated. -No lab or clinical evidence of CLL/SLL progression or recurrence at this time. -Discussed CDC guidelines regarding the COVID19 booster. Will give in clinic today.  -Pt advised to contact if any new symptoms prior to our next scheduled visit.  -Will see back in 6 months with labs   FOLLOW UP: Covid booster today RTC with Dr Irene Limbo with labs in 6 months   The total time spent in the appt was 20 minutes and more than 50% was on  counseling and direct patient cares.  All of the patient's questions were answered with apparent satisfaction. The patient knows to call the clinic with any problems, questions or concerns.   Sullivan Lone MD [place] [facility]AHIVMS [facility] Hematology/Oncology Physician [facility]  (Office):       [phone] (Work cell):  [phone] (Fax):           [phone]  06/17/2020 12:36 PM   I, Yevette Edwards, am acting as a scribe for Dr. Sullivan Lone.   .I have reviewed the above documentation for accuracy and completeness, and I agree with the above. Brunetta Genera MD

## 2020-10-29 ENCOUNTER — Telehealth: Payer: Self-pay | Admitting: Hematology

## 2020-10-29 NOTE — Telephone Encounter (Signed)
R/s per 12/15 los, Patient ware

## 2020-12-16 ENCOUNTER — Other Ambulatory Visit: Payer: No Typology Code available for payment source

## 2020-12-16 ENCOUNTER — Ambulatory Visit: Payer: No Typology Code available for payment source | Admitting: Hematology

## 2021-01-07 NOTE — Progress Notes (Signed)
HEMATOLOGY/ONCOLOGY CLINIC NOTE  Date of Service: 01/08/21  Patient Care Team: [name], MD as PCP - General (Family Medicine)  CHIEF COMPLAINTS/PURPOSE OF CONSULTATION:  F/u for mx of CLL/SLL  HISTORY OF PRESENTING ILLNESS:   Jordan Schroeder is a wonderful 40 y.o. 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 He is accompanied today by his mother. The pt reports that he is doing well overall.   The pt reports that he has not had any significant medical problems in the past. He notes that on an annual physical, his PCP noticed his WBC was high, and after repeat blood tests, he was referred to Dr. Serena Croissant. Dr. Pamelia Hoit has already begun the initial work ups as noted below. The pt notes that he does not notice any change in his energy levels or feeling any different over the last 6 months.   He notes that he has some concerns for sleep apnea as he snores a lot at night but does not wake up due to SOB.   Of note prior to the patient's visit today, pt has had CT C/A/P completed on 10/11/17 with results revealing Lymphadenopathy throughout the chest, abdomen, and pelvis.   On 09/21/17 the pt also had a Flow Cytometry revealing MONOCLONAL B-CELL POPULATION IDENTIFIED.  Most recent lab results (09/21/17) of CBC  is as follows: all values are WNL except for WBC at 23.4k, Lymphs Abs at 15.9k, Monocytes Abs at 1.1k. LDH 09/21/17 was WNL at 214. FISH 09/21/12 is pending.  FISH Oncology 10/05/17 revealed CEP 12 at 100%,  13q14.3 is at 47.67% for normal nuclei, and 35.67% with mono allelic deletion of [id number]. CL TP53 for 17p13 revealed 100% with normal nuclei.  ATM for 11q22.3 revealed 81.67% with normal nuclei and 18.33% with positive nuclei for del 11q22.3  On review of systems, pt reports some lower abdominal pain, good energy levels, and denies fevers, chills, night sweats, weight loss, diarrhea, blood in the stools,  black stools, problems passing urine, and any other symptoms.   On PMHx the pt reports HTN. . On Social Hx the pt denies smoking at any point, any ETOH consumption, and illicit drug use.  Interval History:  Cortlin Marano returns today for management and evaluation of his CLL/SLL after completing 6 planned cycles of BR treatment. The patient's last visit with [place] was on 06/17/2020. The pt reports that he is doing well overall.  The pt reports that he has been doing well over the last seven months with no new symptoms or concerns.   Lab results today 01/08/2021 of CBC w/diff and CMP is as follows: all values are WNL except for WBC of 38.0K, Lymphs Abs of 32.9K, Monocytes Abs of 1.7K, Glucose of 106, Total protein of 6.4. 01/08/2021 LDH of 202.  On review of systems, pt denies fevers, chills, night sweats, sudden weight loss, abdominal pain, back pain, leg swelling, new lumps/bumps, and any other symptoms.   MEDICAL HISTORY:  Past Medical History:  Diagnosis Date   Hypertension     SURGICAL HISTORY: No past surgical history on file.  SOCIAL HISTORY: Social History   Socioeconomic History   Marital status: Married    Spouse name: Not on file   Number of children: Not on file   Years of education: Not on file   Highest education level: Not on file  Occupational History   Not on file  Tobacco  Use   Smoking status: Never   Smokeless tobacco: Never  Vaping Use   Vaping Use: Never used  Substance and Sexual Activity   Alcohol use: Not Currently   Drug use: Never   Sexual activity: Not on file  Other Topics Concern   Not on file  Social History Narrative   Not on file   Social Determinants of Health   Financial Resource Strain: Not on file  Food Insecurity: Not on file  Transportation Needs: Not on file  Physical Activity: Not on file  Stress: Not on file  Social Connections: Not on file  Intimate Partner Violence: Not on file    FAMILY HISTORY: No family history  on file.  ALLERGIES:  has No Known Allergies.  MEDICATIONS:  Current Outpatient Medications  Medication Sig Dispense Refill   acyclovir (ZOVIRAX) 400 MG tablet Take 1 tablet (400 mg total) by mouth daily. (Patient not taking: Reported on 08/13/2019) 30 tablet 3   dexamethasone (DECADRON) 4 MG tablet Take 2 tablets (8 mg total) by mouth daily. Start the day after bendamustine chemotherapy for 2 days. Take with food. (Patient not taking: Reported on 08/13/2019) 30 tablet 1   fenofibrate micronized (LOFIBRA) 67 MG capsule Take 67 mg by mouth daily before breakfast.     gabapentin (NEURONTIN) 300 MG capsule Take 1 capsule (300 mg total) by mouth at bedtime. (Patient not taking: Reported on 08/13/2019) 60 capsule 1   hydrOXYzine (ATARAX/VISTARIL) 25 MG tablet Take 1 tablet (25 mg total) by mouth 3 (three) times daily as needed for anxiety or nausea. (Patient not taking: Reported on 08/13/2019) 30 tablet 0   lidocaine (XYLOCAINE) 5 % ointment Apply 1 application topically as needed. 1-2 times daily in thin layer over painful shingles rash on chest wall 35.44 g 0   lisinopril (PRINIVIL,ZESTRIL) 10 MG tablet Take 10 mg by mouth daily.     oxyCODONE-acetaminophen (PERCOCET/ROXICET) 5-325 MG tablet Take 1 tablet by mouth every 4 (four) hours as needed for severe pain (severe pain from Shingles rash). (Patient not taking: Reported on 08/13/2019) 30 tablet 0   prochlorperazine (COMPAZINE) 10 MG tablet Take 1 tablet (10 mg total) by mouth every 6 (six) hours as needed (Nausea or vomiting). (Patient not taking: Reported on 08/13/2019) 30 tablet 1   sertraline (ZOLOFT) 100 MG tablet Take 100 mg by mouth daily.     valACYclovir (VALTREX) 500 MG tablet Take 1 tablet (500 mg total) by mouth 2 (two) times daily. (Patient not taking: Reported on 08/13/2019) 60 tablet 2   VITAMIN D PO Take 2,000 Units by mouth.     No current facility-administered medications for this visit.    REVIEW OF SYSTEMS:   10 Point review of Systems  was done is negative except as noted above.  PHYSICAL EXAMINATION: Vitals:   01/08/21 1208  BP: 111/69  Pulse: 90  Resp: 18  Temp: 99.1 F (37.3 C)  SpO2: 97%    Wt Readings from Last 3 Encounters:  01/08/21 265 lb 3.2 oz (120.3 kg)  06/17/20 277 lb 8 oz (125.9 kg)  12/16/19 267 lb 11.2 oz (121.4 kg)   Body mass index is 40.32 kg/m.    NAD. GENERAL:alert, in no acute distress and comfortable SKIN: no acute rashes, no significant lesions EYES: conjunctiva are pink and non-injected, sclera anicteric OROPHARYNX: MMM, no exudates, no oropharyngeal erythema or ulceration NECK: supple, no JVD LYMPH:  no palpable lymphadenopathy in the cervical, axillary or inguinal regions LUNGS: clear to auscultation  b/l with normal respiratory effort HEART: regular rate & rhythm ABDOMEN:  normoactive bowel sounds , non tender, not distended. No palpable hepatosplenomegaly.  Extremity: no pedal edema PSYCH: alert & oriented x 3 with fluent speech NEURO: no focal motor/sensory deficits  LABORATORY DATA:  I have reviewed the data as listed  . CBC Latest Ref Rng & Units 01/08/2021 06/17/2020 12/16/2019  WBC 4.0 - 10.5 K/uL 38.0(H) 9.4 6.0  Hemoglobin 13.0 - 17.0 g/dL 14.5 14.2 14.6  Hematocrit 39.0 - 52.0 % 42.4 42.7 42.6  Platelets 150 - 400 K/uL 229 223 207    . CMP Latest Ref Rng & Units 01/08/2021 06/17/2020 12/16/2019  Glucose 70 - 99 mg/dL 106(H) 95 102(H)  BUN 6 - 20 mg/dL _0 Creatinine 0.61 - 1.24 mg/dL 0.84 0.94 0.96  Sodium 135 - 145 mmol/L 141 140 141  Potassium 3.5 - 5.1 mmol/L 4.2 4.2 4.3  Chloride 98 - 111 mmol/L 110 106 105  CO2 22 - 32 mmol/L _1 Calcium 8.9 - 10.3 mg/dL 9.2 9.5 9.7  Total Protein 6.5 - 8.1 g/dL 6.4(L) 6.9 7.0  Total Bilirubin 0.3 - 1.2 mg/dL 0.5 0.9 0.5  Alkaline Phos 38 - 126 U/L 73 75 85  AST 15 - 41 U/L 24 29 35  ALT 0 - 44 U/L 25 31 45(H)   Component     Latest Ref Rng & Units 10/30/2017  HIV Screen 4th Generation wRfx     Non  Reactive Non Reactive  HCV Ab     0.0 - 0.9 s/co ratio <0.1  Hepatitis B Surface Ag     Negative Negative  Hep B Core Ab, Tot     Negative Negative      10/25/17 Tissue Flow Cytometry:    10/25/17 Needle/core Bx:   FISH Oncology  Order: [id number]  Status:  Final result   Visible to patient:  No (Not Released) Next appt:  11/07/2017 at 09:00 AM in Radiology ([facility] PET) Dx:  Lymphocytosis  Component 28moago  Specimen Type Comment:   Comment: BLOOD  Cells Counted 200   Cells Analyzed 200   FISH Result Comment:   Comment: NORMAL:  NO CYCLIN D1 OR IGH GENE REARRANGEMENT OBSERVED  Interpretation Comment:   Comment: (NOTE)               nuc ish 11q13(CCND1x2),14q32(IGHx2)[200]       The fluorescence in situ hybridization (FISH) result  was normal. Dual color dual fusion DNA FISH probes targeting  the cyclin D1 gene (CCND1 or BCL1)([employer] and the  heavy chain immunoglobulin gene (IgH) at 14q32 showed two  hybridization signals each with no fusion in all cells  examined. Thus, there was NO apparent rearrangement of the  primary genes involved with mantle cell lymphoma, and to a  lesser extent myeloma.  .Marland Kitchen        RADIOGRAPHIC STUDIES: I have personally reviewed the radiological images as listed and agreed with the findings in the report. No results found.  ASSESSMENT & PLAN:   40y.o. male with  1. 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  -Reviewed 10/11/17 CT of C/A/P which revealed lymphadenopathy throughout the chest, abdomen and pelvis. Most notably in the axilla and abdomen.   -Noted  13q deletion: 13q14.3 is at 47.67% for normal nuclei, and 58.52% with mono allelic deletion of [id number].  -ATM for 11q22.3 revealed 81.67% with normal nuclei and 18.33%  with positive nuclei for del 11q22.3 ---> point more to CLL/SLL   02/26/18 CT C/A/P revealed  Marked response to therapy as evidenced by decrease in size or resolution of adenopathy throughout chest, abdomen and pelvis. 2. Hepatic steatosis. Slight marginal irregularity suggests cirrhosis. 3. Enlarged pulmonary arteries, indicative of pulmonary arterial hypertension.   S/p 6 cycles of BR completed on 04/19/18  06/04/18 CT C/A/P revealed Stable CT chest without thoracic lymphadenopathy. Continued further decrease in abdominal and pelvic lymphadenopathy. No new or progressive lymphadenopathy on today's exam. 2. Hepatic steatosis. Subtle nodularity of liver contour raises the question of cirrhosis. 3. Pulmonary arterial enlargement. Pulmonary arterial hypertension a distinct concern.  2. H/o Rituxan infusion reaction-  -Had significant flushing with rituxan needing steroids. Not a candidate in future for rapid infusion protocol.  3. Sleep apnea Notes he had a sleep study since his last clinic visit and diagnosis was confirmed. -Pt now uses CPAP  PLAN: -Discussed pt labwork today, 01/08/2021; lymphocytes keep increasing. Chemistries normal, LDH stable. -Recommended pt we get repeat scans prior to next visit. His last were 2.5 months ago. -Advised pt that we would not start to treat unless he meets the criteria for this. Will continue to monitor. -No lab or clinical evidence of CLL/SLL progression or recurrence at this time. -Pt advised to contact if any new symptoms prior to our next scheduled visit.  -Continue Vitamin D daily. Recommended 1000-2000 IU daily. -Will get CT CAP and Neck in 2.5 months. -Will see back in 3 months with labs.   FOLLOW UP: CT neck/chest/abd/pelvis in 11 weeks Labs in 11 weeks RTC with Dr Irene Limbo in 12 weeks   The total time spent in the appt was 20 minutes and more than 50% was on counseling and direct patient cares.  All of the patient's questions were answered  with apparent satisfaction. The patient knows to call the clinic with any problems, questions or concerns.   Sullivan Lone MD [place] [facility] Hematology/Oncology Physician [facility]  (Office):       [phone] (Work cell):  [phone] (Fax):           [phone]  01/08/2021 12:34 PM   I, Reinaldo Raddle, am acting as scribe for Dr. Sullivan Lone, MD.     .I have reviewed the above documentation for accuracy and completeness, and I agree with the above. Brunetta Genera MD

## 2021-01-08 ENCOUNTER — Other Ambulatory Visit: Payer: Self-pay

## 2021-01-08 ENCOUNTER — Inpatient Hospital Stay: Payer: No Typology Code available for payment source | Attending: Hematology

## 2021-01-08 ENCOUNTER — Inpatient Hospital Stay: Payer: No Typology Code available for payment source | Admitting: Hematology

## 2021-01-08 VITALS — BP 111/69 | HR 90 | Temp 99.1°F | Resp 18 | Wt 265.2 lb

## 2021-01-08 DIAGNOSIS — C8308 Small cell B-cell lymphoma, lymph nodes of multiple sites: Secondary | ICD-10-CM

## 2021-01-08 DIAGNOSIS — R109 Unspecified abdominal pain: Secondary | ICD-10-CM | POA: Insufficient documentation

## 2021-01-08 DIAGNOSIS — I10 Essential (primary) hypertension: Secondary | ICD-10-CM | POA: Diagnosis not present

## 2021-01-08 DIAGNOSIS — G4730 Sleep apnea, unspecified: Secondary | ICD-10-CM | POA: Diagnosis not present

## 2021-01-08 DIAGNOSIS — C9110 Chronic lymphocytic leukemia of B-cell type not having achieved remission: Secondary | ICD-10-CM | POA: Diagnosis not present

## 2021-01-08 DIAGNOSIS — Z79899 Other long term (current) drug therapy: Secondary | ICD-10-CM | POA: Insufficient documentation

## 2021-01-08 LAB — CBC WITH DIFFERENTIAL/PLATELET
Abs Immature Granulocytes: 0.05 10*3/uL (ref 0.00–0.07)
Basophils Absolute: 0.1 10*3/uL (ref 0.0–0.1)
Basophils Relative: 0 %
Eosinophils Absolute: 0.3 10*3/uL (ref 0.0–0.5)
Eosinophils Relative: 1 %
HCT: 42.4 % (ref 39.0–52.0)
Hemoglobin: 14.5 g/dL (ref 13.0–17.0)
Immature Granulocytes: 0 %
Lymphocytes Relative: 86 %
Lymphs Abs: 32.9 10*3/uL — ABNORMAL HIGH (ref 0.7–4.0)
MCH: 30.9 pg (ref 26.0–34.0)
MCHC: 34.2 g/dL (ref 30.0–36.0)
MCV: 90.4 fL (ref 80.0–100.0)
Monocytes Absolute: 1.7 10*3/uL — ABNORMAL HIGH (ref 0.1–1.0)
Monocytes Relative: 5 %
Neutro Abs: 3 10*3/uL (ref 1.7–7.7)
Neutrophils Relative %: 8 %
Platelets: 229 10*3/uL (ref 150–400)
RBC: 4.69 MIL/uL (ref 4.22–5.81)
RDW: 13.1 % (ref 11.5–15.5)
WBC: 38 10*3/uL — ABNORMAL HIGH (ref 4.0–10.5)
nRBC: 0 % (ref 0.0–0.2)

## 2021-01-08 LAB — CMP (CANCER CENTER ONLY)
ALT: 25 U/L (ref 0–44)
AST: 24 U/L (ref 15–41)
Albumin: 4 g/dL (ref 3.5–5.0)
Alkaline Phosphatase: 73 U/L (ref 38–126)
Anion gap: 7 (ref 5–15)
BUN: 16 mg/dL (ref 6–20)
CO2: 24 mmol/L (ref 22–32)
Calcium: 9.2 mg/dL (ref 8.9–10.3)
Chloride: 110 mmol/L (ref 98–111)
Creatinine: 0.84 mg/dL (ref 0.61–1.24)
GFR, Estimated: >60 mL/min (ref >60)
Glucose, Bld: 106 mg/dL — ABNORMAL HIGH (ref 70–99)
Potassium: 4.2 mmol/L (ref 3.5–5.1)
Sodium: 141 mmol/L (ref 135–145)
Total Bilirubin: 0.5 mg/dL (ref 0.3–1.2)
Total Protein: 6.4 g/dL — ABNORMAL LOW (ref 6.5–8.1)

## 2021-01-08 LAB — LACTATE DEHYDROGENASE: LDH: 202 U/L — ABNORMAL HIGH (ref 98–192)

## 2021-03-26 ENCOUNTER — Telehealth: Payer: Self-pay

## 2021-03-26 ENCOUNTER — Ambulatory Visit (HOSPITAL_COMMUNITY)
Admission: RE | Admit: 2021-03-26 | Discharge: 2021-03-26 | Disposition: A | Discharge status: Home / Self Care | Payer: No Typology Code available for payment source | Source: Ambulatory Visit | Attending: Hematology | Admitting: Hematology

## 2021-03-26 ENCOUNTER — Inpatient Hospital Stay: Payer: No Typology Code available for payment source | Attending: Hematology

## 2021-03-26 ENCOUNTER — Other Ambulatory Visit: Payer: Self-pay

## 2021-03-26 DIAGNOSIS — C9110 Chronic lymphocytic leukemia of B-cell type not having achieved remission: Secondary | ICD-10-CM

## 2021-03-26 DIAGNOSIS — C8308 Small cell B-cell lymphoma, lymph nodes of multiple sites: Secondary | ICD-10-CM | POA: Insufficient documentation

## 2021-03-26 LAB — CMP (CANCER CENTER ONLY)
ALT: 23 U/L (ref 0–44)
AST: 26 U/L (ref 15–41)
Albumin: 4.4 g/dL (ref 3.5–5.0)
Alkaline Phosphatase: 67 U/L (ref 38–126)
Anion gap: 7 (ref 5–15)
BUN: 13 mg/dL (ref 6–20)
CO2: 24 mmol/L (ref 22–32)
Calcium: 9.5 mg/dL (ref 8.9–10.3)
Chloride: 111 mmol/L (ref 98–111)
Creatinine: 0.94 mg/dL (ref 0.61–1.24)
GFR, Estimated: >60 mL/min (ref >60)
Glucose, Bld: 105 mg/dL — ABNORMAL HIGH (ref 70–99)
Potassium: 4.7 mmol/L (ref 3.5–5.1)
Sodium: 142 mmol/L (ref 135–145)
Total Bilirubin: 0.7 mg/dL (ref 0.3–1.2)
Total Protein: 6.5 g/dL (ref 6.5–8.1)

## 2021-03-26 LAB — CBC WITH DIFFERENTIAL (CANCER CENTER ONLY)
Abs Immature Granulocytes: 0.08 10*3/uL — ABNORMAL HIGH (ref 0.00–0.07)
Basophils Absolute: 0.1 10*3/uL (ref 0.0–0.1)
Basophils Relative: 0 %
Eosinophils Absolute: 0.3 10*3/uL (ref 0.0–0.5)
Eosinophils Relative: 1 %
HCT: 42.9 % (ref 39.0–52.0)
Hemoglobin: 14.2 g/dL (ref 13.0–17.0)
Immature Granulocytes: 0 %
Lymphocytes Relative: 87 %
Lymphs Abs: 47.3 10*3/uL — ABNORMAL HIGH (ref 0.7–4.0)
MCH: 30.4 pg (ref 26.0–34.0)
MCHC: 33.1 g/dL (ref 30.0–36.0)
MCV: 91.9 fL (ref 80.0–100.0)
Monocytes Absolute: 2.6 10*3/uL — ABNORMAL HIGH (ref 0.1–1.0)
Monocytes Relative: 5 %
Neutro Abs: 3.8 10*3/uL (ref 1.7–7.7)
Neutrophils Relative %: 7 %
Platelet Count: 223 10*3/uL (ref 150–400)
RBC: 4.67 MIL/uL (ref 4.22–5.81)
RDW: 13.2 % (ref 11.5–15.5)
WBC Count: 54 10*3/uL (ref 4.0–10.5)
nRBC: 0 % (ref 0.0–0.2)

## 2021-03-26 MED ORDER — IOHEXOL 350 MG/ML SOLN
80.0000 mL | Freq: Once | INTRAVENOUS | Status: AC | PRN
  Administered 2021-03-26: 80 mL via INTRAVENOUS

## 2021-03-26 NOTE — Telephone Encounter (Signed)
CRITICAL VALUE STICKER  CRITICAL VALUE: WBC 54.0  RECEIVER (on-site recipient of call): Jettie Booze  DATE & TIME NOTIFIED: 03/26/2021 8182  MESSENGER (representative from lab): Butch Penny  MD NOTIFIED: Dr. Irene Limbo   TIME OF NOTIFICATION: 0840   RESPONSE: Awaiting further orders.

## 2021-04-01 ENCOUNTER — Other Ambulatory Visit: Payer: Self-pay

## 2021-04-01 DIAGNOSIS — C8308 Small cell B-cell lymphoma, lymph nodes of multiple sites: Secondary | ICD-10-CM

## 2021-04-02 ENCOUNTER — Inpatient Hospital Stay (HOSPITAL_BASED_OUTPATIENT_CLINIC_OR_DEPARTMENT_OTHER): Payer: No Typology Code available for payment source | Admitting: Hematology

## 2021-04-02 ENCOUNTER — Other Ambulatory Visit: Payer: Self-pay

## 2021-04-02 VITALS — BP 122/79 | HR 91 | Temp 98.7°F | Resp 18 | Wt 269.8 lb

## 2021-04-02 DIAGNOSIS — M109 Gout, unspecified: Secondary | ICD-10-CM | POA: Insufficient documentation

## 2021-04-02 DIAGNOSIS — C9110 Chronic lymphocytic leukemia of B-cell type not having achieved remission: Secondary | ICD-10-CM | POA: Diagnosis not present

## 2021-04-02 DIAGNOSIS — I10 Essential (primary) hypertension: Secondary | ICD-10-CM | POA: Insufficient documentation

## 2021-04-02 DIAGNOSIS — G4730 Sleep apnea, unspecified: Secondary | ICD-10-CM | POA: Insufficient documentation

## 2021-04-02 DIAGNOSIS — Z79899 Other long term (current) drug therapy: Secondary | ICD-10-CM | POA: Insufficient documentation

## 2021-04-08 NOTE — Progress Notes (Signed)
HEMATOLOGY/ONCOLOGY CLINIC NOTE  Date of Service: .04/02/2021   Patient Care Team: Jordan Bien, MD as PCP - General (Family Medicine)  CHIEF COMPLAINTS/PURPOSE OF CONSULTATION:  F/u for mx of CLL/SLL  HISTORY OF PRESENTING ILLNESS:   Jordan Schroeder is a wonderful 40 y.o. 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 He is accompanied today by his mother. The pt reports that he is doing well overall.   The pt reports that he has not had any significant medical problems in the past. He notes that on an annual physical, his PCP noticed his WBC was high, and after repeat blood tests, he was referred to Dr. Nicholas Lose. Dr. Lindi Adie has already begun the initial work ups as noted below. The pt notes that he does not notice any change in his energy levels or feeling any different over the last 6 months.   He notes that he has some concerns for sleep apnea as he snores a lot at night but does not wake up due to SOB.   Of note prior to the patient's visit today, pt has had CT C/A/P completed on 10/11/17 with results revealing Lymphadenopathy throughout the chest, abdomen, and pelvis.   On 09/21/17 the pt also had a Flow Cytometry revealing MONOCLONAL B-CELL POPULATION IDENTIFIED.  Most recent lab results (09/21/17) of CBC  is as follows: all values are WNL except for WBC at 23.4k, Lymphs Abs at 15.9k, Monocytes Abs at 1.1k. LDH 09/21/17 was WNL at 214. FISH 09/21/12 is pending.  [facility] 10/05/17 revealed CEP 12 at 100%,  13q14.3 is at 47.67% for normal nuclei, and 75.17% with mono allelic deletion of [id number]. CL TP53 for 17p13 revealed 100% with normal nuclei.  ATM for 11q22.3 revealed 81.67% with normal nuclei and 18.33% with positive nuclei for del 11q22.3  On review of systems, pt reports some lower abdominal pain, good energy levels, and denies fevers, chills, night sweats, weight loss, diarrhea, blood in the  stools, black stools, problems passing urine, and any other symptoms.   On PMHx the pt reports HTN. . On Social Hx the pt denies smoking at any point, any ETOH consumption, and illicit drug use.  Interval History:  Jordan Schroeder returns today for management and evaluation of his CLL/SLL. The patient's last visit with [place] was on 01/08/2021. The pt reports that he is doing well overall.  The pt reports that he has been doing well over the last seven months with no new symptoms or concerns.   Lab results today 04/02/2021  were reviewed with the patient.  On review of systems, patient notes no fevers or chills no night sweats no unexpected weight loss no abdominal pain or distention.  No new fatigue or change in energy level.  MEDICAL HISTORY:  Past Medical History:  Diagnosis Date   Hypertension     SURGICAL HISTORY: No past surgical history on file.  SOCIAL HISTORY: Social History   Socioeconomic History   Marital status: Married    Spouse name: Not on file   Number of children: Not on file   Years of education: Not on file   Highest education level: Not on file  Occupational History   Not on file  Tobacco Use   Smoking status: Never   Smokeless tobacco: Never  Vaping Use   Vaping Use: Never used  Substance and Sexual Activity   Alcohol use: Not Currently  Drug use: Never   Sexual activity: Not on file  Other Topics Concern   Not on file  Social History Narrative   Not on file   Social Determinants of Health   Financial Resource Strain: Not on file  Food Insecurity: Not on file  Transportation Needs: Not on file  Physical Activity: Not on file  Stress: Not on file  Social Connections: Not on file  Intimate Partner Violence: Not on file    FAMILY HISTORY: No family history on file.  ALLERGIES:  has No Known Allergies.  MEDICATIONS:  Current Outpatient Medications  Medication Sig Dispense Refill   acyclovir (ZOVIRAX) 400 MG tablet Take 1 tablet (400 mg  total) by mouth daily. (Patient not taking: Reported on 08/13/2019) 30 tablet 3   dexamethasone (DECADRON) 4 MG tablet Take 2 tablets (8 mg total) by mouth daily. Start the day after bendamustine chemotherapy for 2 days. Take with food. (Patient not taking: Reported on 08/13/2019) 30 tablet 1   fenofibrate micronized (LOFIBRA) 67 MG capsule Take 67 mg by mouth daily before breakfast.     gabapentin (NEURONTIN) 300 MG capsule Take 1 capsule (300 mg total) by mouth at bedtime. (Patient not taking: Reported on 08/13/2019) 60 capsule 1   hydrOXYzine (ATARAX/VISTARIL) 25 MG tablet Take 1 tablet (25 mg total) by mouth 3 (three) times daily as needed for anxiety or nausea. (Patient not taking: Reported on 08/13/2019) 30 tablet 0   lidocaine (XYLOCAINE) 5 % ointment Apply 1 application topically as needed. 1-2 times daily in thin layer over painful shingles rash on chest wall 35.44 g 0   lisinopril (PRINIVIL,ZESTRIL) 10 MG tablet Take 10 mg by mouth daily.     oxyCODONE-acetaminophen (PERCOCET/ROXICET) 5-325 MG tablet Take 1 tablet by mouth every 4 (four) hours as needed for severe pain (severe pain from Shingles rash). (Patient not taking: Reported on 08/13/2019) 30 tablet 0   prochlorperazine (COMPAZINE) 10 MG tablet Take 1 tablet (10 mg total) by mouth every 6 (six) hours as needed (Nausea or vomiting). (Patient not taking: Reported on 08/13/2019) 30 tablet 1   sertraline (ZOLOFT) 100 MG tablet Take 100 mg by mouth daily.     valACYclovir (VALTREX) 500 MG tablet Take 1 tablet (500 mg total) by mouth 2 (two) times daily. (Patient not taking: Reported on 08/13/2019) 60 tablet 2   VITAMIN D PO Take 2,000 Units by mouth.     No current facility-administered medications for this visit.    REVIEW OF SYSTEMS:   .10 Point review of Systems was done is negative except as noted above.  PHYSICAL EXAMINATION: Vitals:   04/02/21 1310  BP: 122/79  Pulse: 91  Resp: 18  Temp: 98.7 F (37.1 C)  SpO2: 97%    Wt Readings  from Last 3 Encounters:  04/02/21 269 lb 12.8 oz (122.4 kg)  01/08/21 265 lb 3.2 oz (120.3 kg)  06/17/20 277 lb 8 oz (125.9 kg)   Body mass index is 41.02 kg/m.   Marland Kitchen GENERAL:alert, in no acute distress and comfortable SKIN: no acute rashes, no significant lesions EYES: conjunctiva are pink and non-injected, sclera anicteric OROPHARYNX: MMM, no exudates, no oropharyngeal erythema or ulceration NECK: supple, no JVD LYMPH:  no palpable lymphadenopathy in the cervical, axillary or inguinal regions LUNGS: clear to auscultation b/l with normal respiratory effort HEART: regular rate & rhythm ABDOMEN:  normoactive bowel sounds , non tender, not distended. Extremity: no pedal edema PSYCH: alert & oriented x 3 with fluent speech NEURO:  no focal motor/sensory deficits   LABORATORY DATA:  I have reviewed the data as listed  . CBC Latest Ref Rng & Units 03/26/2021 01/08/2021 06/17/2020  WBC 4.0 - 10.5 K/uL 54.0(HH) 38.0(H) 9.4  Hemoglobin 13.0 - 17.0 g/dL 14.2 14.5 14.2  Hematocrit 39.0 - 52.0 % 42.9 42.4 42.7  Platelets 150 - 400 K/uL 223 229 223    . CMP Latest Ref Rng & Units 03/26/2021 01/08/2021 06/17/2020  Glucose 70 - 99 mg/dL 105(H) 106(H) 95  BUN 6 - 20 mg/dL '13 16 16  ' Creatinine 0.61 - 1.24 mg/dL 0.94 0.84 0.94  Sodium 135 - 145 mmol/L 142 141 140  Potassium 3.5 - 5.1 mmol/L 4.7 4.2 4.2  Chloride 98 - 111 mmol/L 111 110 106  CO2 22 - 32 mmol/L '24 24 26  ' Calcium 8.9 - 10.3 mg/dL 9.5 9.2 9.5  Total Protein 6.5 - 8.1 g/dL 6.5 6.4(L) 6.9  Total Bilirubin 0.3 - 1.2 mg/dL 0.7 0.5 0.9  Alkaline Phos 38 - 126 U/L 67 73 75  AST 15 - 41 U/L '26 24 29  ' ALT 0 - 44 U/L '23 25 31   ' . Lab Results  Component Value Date   LDH 202 (H) 01/08/2021    Component     Latest Ref Rng & Units 10/30/2017  HIV Screen 4th Generation wRfx     Non Reactive Non Reactive  HCV Ab     0.0 - 0.9 s/co ratio <0.1  Hepatitis B Surface Ag     Negative Negative  Hep B Core Ab, Tot     Negative Negative       10/25/17 Tissue Flow Cytometry:    10/25/17 Needle/core Bx:   [facility] Oncology  Order: [id number]  Status:  Final result   Visible to patient:  No (Not Released) Next appt:  11/07/2017 at 09:00 AM in Radiology ([facility] PET) Dx:  Lymphocytosis  Component 9moago  Specimen Type Comment:   Comment: BLOOD  Cells Counted 200   Cells Analyzed 200   FISH Result Comment:   Comment: NORMAL:  NO CYCLIN D1 OR IGH GENE REARRANGEMENT OBSERVED  Interpretation Comment:   Comment: (NOTE)               nuc ish 11q13(CCND1x2),14q32(IGHx2)[200]       The fluorescence in situ hybridization (FISH) result  was normal. Dual color dual fusion DNA FISH probes targeting  the cyclin D1 gene (CCND1 or BCL1)([employer] and the  heavy chain immunoglobulin gene (IgH) at 14q32 showed two  hybridization signals each with no fusion in all cells  examined. Thus, there was NO apparent rearrangement of the  primary genes involved with mantle cell lymphoma, and to a  lesser extent myeloma.  .Marland Kitchen        RADIOGRAPHIC STUDIES: I have personally reviewed the radiological images as listed and agreed with the findings in the report. CT Soft Tissue Neck W Contrast  Result Date: 03/27/2021 CLINICAL DATA:  Hematologic malignancy, staging Plz evaluate for CLL/SLL progression. EXAM: CT NECK WITH CONTRAST TECHNIQUE: Multidetector CT imaging of the neck was performed using the standard protocol following the bolus administration of intravenous contrast. CONTRAST:  844mOMNIPAQUE IOHEXOL 350 MG/ML SOLN COMPARISON:  PET-CT Nov 07, 2017. FINDINGS: Pharynx and larynx: Normal. No mass or swelling. Salivary glands: No inflammation, mass, or stone. Thyroid: Normal. Lymph nodes: Increased number of nodes throughout the neck, many of which are enlarged. A few index left submandibular nodes may be slightly increased  in size. For example, left submandibular nodes measure 1.4 and 1.2 cm short axis on series 2, image 52 (previously 0.2 and  0.9 cm). Index left level II node measures 1.8 cm short axis on series 2, image 52, similar to prior. Index left level 3 node measures 1.8 cm on series 2, image 67, similar to prior. Index left level V node measures 1.1 cm on series 2, image 46, similar prior. Vascular: Limited evaluation due to non arterial timing. Major arteries in the neck appear to be grossly patent. Limited intracranial: Negative. Visualized orbits: Negative. Mastoids and visualized paranasal sinuses: Oval 2.1 x 1.3 cm mass in the posterior right nasal cavity (series 5, image 22) Skeleton: Multilevel degenerative disc disease in the mid cervical spine. Upper chest: Evaluated on concurrent CT chest/abdomen/pelvis. IMPRESSION: 1. Limited direct comparison to prior PET-CT (no prior neck CTs) with possible slight increase in size of a few left submandibular nodes. Otherwise, similar increased number of lymph nodes throughout the neck without substantial change in multiple mildly enlarged nodes (detailed above). 2. Oval 2.1 x 1.3 cm mass in the posterior right nasal cavity, nonspecific but potentially a polyp or retention cyst. Recommend correlation with direct inspection. Electronically Signed   By: Margaretha Sheffield M.D.   On: 03/27/2021 09:02   CT CHEST ABDOMEN PELVIS W CONTRAST  Result Date: 03/29/2021 CLINICAL DATA:  Small B-cell lymphoma. Chronic lymphocytic leukemia. Restaging. EXAM: CT CHEST, ABDOMEN, AND PELVIS WITH CONTRAST TECHNIQUE: Multidetector CT imaging of the chest, abdomen and pelvis was performed following the standard protocol during bolus administration of intravenous contrast. CONTRAST:  5m OMNIPAQUE IOHEXOL 350 MG/ML SOLN COMPARISON:  06/04/2018. FINDINGS: CT CHEST FINDINGS Cardiovascular: The heart size is normal. No substantial pericardial effusion. No thoracic aortic aneurysm. Enlargement of the pulmonary outflow tract and main pulmonary arteries suggests pulmonary arterial hypertension. Mediastinum/Nodes: Interval  development of mediastinal lymphadenopathy. 17 mm short axis subcarinal node identified on 26/2. 11 mm short axis prevascular node visible on 16/2. Bulky axillary lymphadenopathy is evident bilaterally with index node in the right axilla measuring 22 mm short axis on image 7/series 2. Index left axillary node measures 17 mm short axis on 08/02. small lymph nodes are seen in the supraclavicular regions bilaterally. The esophagus has normal imaging features. Lungs/Pleura: No suspicious pulmonary nodule or mass. The diffuse mosaic attenuation seen previously has resolved in the interval. 2-3 mm left upper lobe nodule on 32/6 was largely obscured on the prior study. No focal airspace consolidation. No pleural effusion. Musculoskeletal: No worrisome lytic or sclerotic osseous abnormality. CT ABDOMEN PELVIS FINDINGS Hepatobiliary: Liver measures 17.7 cm craniocaudal length, upper normal to borderline enlarged. There is no evidence for gallstones, gallbladder wall thickening, or pericholecystic fluid. No intrahepatic or extrahepatic biliary dilation. Pancreas: No focal mass lesion. No dilatation of the main duct. No intraparenchymal cyst. No peripancreatic edema. Spleen: 11.5 cm craniocaudal length. No splenomegaly. No focal mass lesion. Adrenals/Urinary Tract: No adrenal nodule or mass. Kidneys unremarkable. No evidence for hydroureter. Bladder is decompressed. Stomach/Bowel: Stomach is unremarkable. No gastric wall thickening. No evidence of outlet obstruction. Duodenum is normally positioned as is the ligament of Treitz. No small bowel wall thickening. No small bowel dilatation. The terminal ileum is normal. The appendix is normal. No gross colonic mass. No colonic wall thickening. Vascular/Lymphatic: No abdominal aortic aneurysm. Markedly bulky lymphadenopathy is identified in the central abdomen. 14.2 x 11.1 cm nodal mass is identified anterior to the right kidney on 64/2. 14.6 x 8.5 cm nodal mass is  identified in the  region of the gastrohepatic ligament on 55/2. Additional retroperitoneal and mesenteric lymphadenopathy is evident including a 3.6 x 2.8 cm lymph node in the ileocolic mesentery. Bulky pelvic sidewall lymphadenopathy evident with 9.6 x 3.7 cm left external iliac node and 4.9 x 3.8 cm right external iliac node on 01/14/2. Mild lymphadenopathy noted in both groin regions. Reproductive: The prostate gland and seminal vesicles are unremarkable. Other: No intraperitoneal free fluid. Musculoskeletal: No worrisome lytic or sclerotic osseous abnormality. IMPRESSION: 1. Interval development of bulky lymphadenopathy in the chest, abdomen, and pelvis. Imaging features compatible with recurrent lymphoma. 2. No hepatosplenomegaly. 3. Enlargement of the pulmonary outflow tract and main pulmonary arteries suggests pulmonary arterial hypertension. 4. 2-3 mm left upper lobe pulmonary nodule was largely obscured on the prior study. Most likely benign, attention on follow-up recommended. Electronically Signed   By: Misty Stanley M.D.   On: 03/29/2021 08:01    ASSESSMENT & PLAN:   40 y.o. male with  1. 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  -Reviewed 10/11/17 CT of C/A/P which revealed lymphadenopathy throughout the chest, abdomen and pelvis. Most notably in the axilla and abdomen.   -Noted 13q deletion: 13q14.3 is at 47.67% for normal nuclei, and 22.44% with mono allelic deletion of [id number].  -ATM for 11q22.3 revealed 81.67% with normal nuclei and 18.33% with positive nuclei for del 11q22.3 ---> point more to CLL/SLL   02/26/18 CT C/A/P revealed  Marked response to therapy as evidenced by decrease in size or resolution of adenopathy throughout chest, abdomen and pelvis. 2. Hepatic steatosis. Slight marginal  irregularity suggests cirrhosis. 3. Enlarged pulmonary arteries, indicative of pulmonary arterial hypertension.   S/p 6 cycles of BR completed on 04/19/18  06/04/18 CT C/A/P revealed Stable CT chest without thoracic lymphadenopathy. Continued further decrease in abdominal and pelvic lymphadenopathy. No new or progressive lymphadenopathy on today's exam. 2. Hepatic steatosis. Subtle nodularity of liver contour raises the question of cirrhosis. 3. Pulmonary arterial enlargement. Pulmonary arterial hypertension a distinct concern.  2. H/o Rituxan infusion reaction-  -Had significant flushing with rituxan needing steroids. Not a candidate in future for rapid infusion protocol.  3. Sleep apnea Notes he had a sleep study since his last clinic visit and diagnosis was confirmed. -Pt now uses CPAP  PLAN: -Discussed pt labwork today, 04/02/2021; CBC shows increasing leukocytosis now up to 54k hemoglobin is normal at 14.2 and platelets are normal at 223k. Chemistries stable. -Patient had CT chest abdomen pelvis done on 03/26/2021 which shows Interval development of bulky lymphadenopathy in the chest, abdomen, and pelvis. Imaging features compatible with recurrent lymphoma. 2. No hepatosplenomegaly. 3. Enlargement of the pulmonary outflow tract and main pulmonary arteries suggests pulmonary arterial hypertension. 4. 2-3 mm left upper lobe pulmonary nodule was largely obscured on the prior study. Most likely benign, attention on follow-up recommended.  -Patient notes no symptomatic progression of his CLL at this time. -No cytopenias no constitutional symptoms but the patient does have recurrent bulky lymphadenopathy. -We discussed that Of treatment would typically be Gazyva plus venetoclax.  -He would like to think start treatment we will follow-up with him on the phone again. -Pt advised to contact if any new symptoms prior to our next scheduled visit.  -Continue Vitamin D daily. Recommended 1000-2000 IU  daily. -Will  see back in 3 months with labs.   FOLLOW UP: RTC with Dr Irene Limbo in 1st week of Jan 2023. With labs  . The total time spent in the appointment was 30 minutes and more than 50% was on counseling and direct patient cares.  All of the patient's questions were answered with apparent satisfaction. The patient knows to call the clinic with any problems, questions or concerns.   Sullivan Lone MD [place] AAHIVMS [facility] Hematology/Oncology Physician [facility]  (Office):       [phone] (Work cell):  [phone] (Fax):           [phone]

## 2021-04-23 ENCOUNTER — Telehealth: Payer: Self-pay | Admitting: Hematology

## 2021-04-23 NOTE — Telephone Encounter (Signed)
Call patient several times. left message to call back. Monitor discuss his CT chest abdomen pelvis and CT neck in more detail after review with radiologist. He does have significant bulky disease which is not clinically symptomatic at this time but might need closer follow-up. Will have my nurse try to schedule him sooner within the next 1-2 weeks to discuss potential initiation of treatment with venetoclax and Gazyva.

## 2021-04-23 NOTE — Telephone Encounter (Signed)
Scheduled per 10/21 in basket, pt has been called and confirmed appt

## 2021-04-30 ENCOUNTER — Other Ambulatory Visit: Payer: Self-pay

## 2021-04-30 ENCOUNTER — Inpatient Hospital Stay: Payer: No Typology Code available for payment source | Attending: Hematology

## 2021-04-30 DIAGNOSIS — C9110 Chronic lymphocytic leukemia of B-cell type not having achieved remission: Secondary | ICD-10-CM | POA: Diagnosis present

## 2021-04-30 DIAGNOSIS — C8308 Small cell B-cell lymphoma, lymph nodes of multiple sites: Secondary | ICD-10-CM

## 2021-04-30 LAB — CMP (CANCER CENTER ONLY)
ALT: 15 U/L (ref 0–44)
AST: 20 U/L (ref 15–41)
Albumin: 4.3 g/dL (ref 3.5–5.0)
Alkaline Phosphatase: 73 U/L (ref 38–126)
Anion gap: 7 (ref 5–15)
BUN: 16 mg/dL (ref 6–20)
CO2: 25 mmol/L (ref 22–32)
Calcium: 9.7 mg/dL (ref 8.9–10.3)
Chloride: 107 mmol/L (ref 98–111)
Creatinine: 0.92 mg/dL (ref 0.61–1.24)
GFR, Estimated: >60 mL/min (ref >60)
Glucose, Bld: 107 mg/dL — ABNORMAL HIGH (ref 70–99)
Potassium: 4.1 mmol/L (ref 3.5–5.1)
Sodium: 139 mmol/L (ref 135–145)
Total Bilirubin: 0.5 mg/dL (ref 0.3–1.2)
Total Protein: 6.5 g/dL (ref 6.5–8.1)

## 2021-04-30 LAB — CBC WITH DIFFERENTIAL (CANCER CENTER ONLY)
Abs Immature Granulocytes: 0.13 10*3/uL — ABNORMAL HIGH (ref 0.00–0.07)
Basophils Absolute: 0.1 10*3/uL (ref 0.0–0.1)
Basophils Relative: 0 %
Eosinophils Absolute: 0.4 10*3/uL (ref 0.0–0.5)
Eosinophils Relative: 1 %
HCT: 43 % (ref 39.0–52.0)
Hemoglobin: 13.9 g/dL (ref 13.0–17.0)
Immature Granulocytes: 0 %
Lymphocytes Relative: 89 %
Lymphs Abs: 61.5 10*3/uL — ABNORMAL HIGH (ref 0.7–4.0)
MCH: 29.8 pg (ref 26.0–34.0)
MCHC: 32.3 g/dL (ref 30.0–36.0)
MCV: 92.3 fL (ref 80.0–100.0)
Monocytes Absolute: 2.5 10*3/uL — ABNORMAL HIGH (ref 0.1–1.0)
Monocytes Relative: 4 %
Neutro Abs: 4 10*3/uL (ref 1.7–7.7)
Neutrophils Relative %: 6 %
Platelet Count: 251 10*3/uL (ref 150–400)
RBC: 4.66 MIL/uL (ref 4.22–5.81)
RDW: 13.3 % (ref 11.5–15.5)
WBC Count: 68.6 10*3/uL (ref 4.0–10.5)
nRBC: 0 % (ref 0.0–0.2)

## 2021-04-30 LAB — LACTATE DEHYDROGENASE: LDH: 229 U/L — ABNORMAL HIGH (ref 98–192)

## 2021-04-30 NOTE — Progress Notes (Signed)
CRITICAL VALUE STICKER  CRITICAL VALUE: WBC 68.6  ATE & TIME NOTIFIED: 04/30/21 03:55  MD NOTIFIED: Dr Irene Limbo  TIME OF NOTIFICATION: 03:55 pm

## 2021-05-04 ENCOUNTER — Inpatient Hospital Stay: Payer: No Typology Code available for payment source | Attending: Hematology | Admitting: Hematology

## 2021-05-04 ENCOUNTER — Other Ambulatory Visit: Payer: Self-pay

## 2021-05-04 DIAGNOSIS — C9110 Chronic lymphocytic leukemia of B-cell type not having achieved remission: Secondary | ICD-10-CM | POA: Diagnosis not present

## 2021-05-04 DIAGNOSIS — Z5111 Encounter for antineoplastic chemotherapy: Secondary | ICD-10-CM | POA: Diagnosis present

## 2021-05-04 DIAGNOSIS — Z7189 Other specified counseling: Secondary | ICD-10-CM

## 2021-05-10 NOTE — Progress Notes (Addendum)
HEMATOLOGY/ONCOLOGY CLINIC NOTE  Date of Service: .05/04/2021   Patient Care Team: [name], MD as PCP - General (Family Medicine)  CHIEF COMPLAINTS/PURPOSE OF CONSULTATION:  F/u for mx of CLL/SLL  HISTORY OF PRESENTING ILLNESS:   Jordan Schroeder is a wonderful 40 y.o. 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 He is accompanied today by his mother. The pt reports that he is doing well overall.   The pt reports that he has not had any significant medical problems in the past. He notes that on an annual physical, his PCP noticed his WBC was high, and after repeat blood tests, he was referred to Dr. Nicholas Lose. Dr. Lindi Adie has already begun the initial work ups as noted below. The pt notes that he does not notice any change in his energy levels or feeling any different over the last 6 months.   He notes that he has some concerns for sleep apnea as he snores a lot at night but does not wake up due to SOB.   Of note prior to the patient's visit today, pt has had CT C/A/P completed on 10/11/17 with results revealing Lymphadenopathy throughout the chest, abdomen, and pelvis.   On 09/21/17 the pt also had a Flow Cytometry revealing MONOCLONAL B-CELL POPULATION IDENTIFIED.  Most recent lab results (09/21/17) of CBC  is as follows: all values are WNL except for WBC at 23.4k, Lymphs Abs at 15.9k, Monocytes Abs at 1.1k. LDH 09/21/17 was WNL at 214. FISH 09/21/12 is pending.  [facility] 10/05/17 revealed CEP 12 at 100%,  13q14.3 is at 47.67% for normal nuclei, and 60.10% with mono allelic deletion of [id number]. CL TP53 for 17p13 revealed 100% with normal nuclei.  ATM for 11q22.3 revealed 81.67% with normal nuclei and 18.33% with positive nuclei for del 11q22.3  On review of systems, pt reports some lower abdominal pain, good energy levels, and denies fevers, chills, night sweats, weight loss, diarrhea, blood in the  stools, black stools, problems passing urine, and any other symptoms.   On PMHx the pt reports HTN. . On Social Hx the pt denies smoking at any point, any ETOH consumption, and illicit drug use.  Interval History:  .I connected with Jordan Schroeder on 05/12/21 at  3:20 PM EDT by telephone visit and verified that I am speaking with the correct person using two identifiers.   I discussed the limitations, risks, security and privacy concerns of performing an evaluation and management service by telemedicine and the availability of in-person appointments. I also discussed with the patient that there may be a patient responsible charge related to this service. The patient expressed understanding and agreed to proceed.   Other persons participating in the visit and their role in the encounter: none   Patient's location: Home  Provider's location: [facility]   Patient was called to follow-up on his CLL .  He notes no acute new symptoms.  Continues to have some night sweats.  No new shortness of breath or chest pain.  No abdominal pain or abdominal distention. Labs done on 04/30/2021 show CBC with elevation of white counts up to 68.6k, hemoglobin of 13.9 platelets of 251k CMP unremarkable LDH 229.  We discussed that after review with the radiologist his CT neck chest abdomen pelvis from 03/26/2021 showed . Interval development of bulky lymphadenopathy in the chest,abdomen, and pelvis. Imaging features compatible with recurrent lymphoma.  We discussed that given his bulky lymphadenopathy and rapidly increasing lymphocyte count that we would offer him second line treatment with Gazyva plus venetoclax.  We discussed this treatment option in detail and patient is enthusiastic to proceed with this treatment.  MEDICAL HISTORY:  Past Medical History:  Diagnosis Date   Hypertension     SURGICAL HISTORY: No past surgical history on file.  SOCIAL HISTORY: Social History   Socioeconomic  History   Marital status: Married    Spouse name: Not on file   Number of children: Not on file   Years of education: Not on file   Highest education level: Not on file  Occupational History   Not on file  Tobacco Use   Smoking status: Never   Smokeless tobacco: Never  Vaping Use   Vaping Use: Never used  Substance and Sexual Activity   Alcohol use: Not Currently   Drug use: Never   Sexual activity: Not on file  Other Topics Concern   Not on file  Social History Narrative   Not on file   Social Determinants of Health   Financial Resource Strain: Not on file  Food Insecurity: Not on file  Transportation Needs: Not on file  Physical Activity: Not on file  Stress: Not on file  Social Connections: Not on file  Intimate Partner Violence: Not on file    FAMILY HISTORY: No family history on file.  ALLERGIES:  has No Known Allergies.  MEDICATIONS:  Current Outpatient Medications  Medication Sig Dispense Refill   acyclovir (ZOVIRAX) 400 MG tablet Take 1 tablet (400 mg total) by mouth daily. (Patient not taking: Reported on 08/13/2019) 30 tablet 3   dexamethasone (DECADRON) 4 MG tablet Take 2 tablets (8 mg total) by mouth daily. Start the day after bendamustine chemotherapy for 2 days. Take with food. (Patient not taking: Reported on 08/13/2019) 30 tablet 1   fenofibrate micronized (LOFIBRA) 67 MG capsule Take 67 mg by mouth daily before breakfast.     gabapentin (NEURONTIN) 300 MG capsule Take 1 capsule (300 mg total) by mouth at bedtime. (Patient not taking: Reported on 08/13/2019) 60 capsule 1   hydrOXYzine (ATARAX/VISTARIL) 25 MG tablet Take 1 tablet (25 mg total) by mouth 3 (three) times daily as needed for anxiety or nausea. (Patient not taking: Reported on 08/13/2019) 30 tablet 0   lidocaine (XYLOCAINE) 5 % ointment Apply 1 application topically as needed. 1-2 times daily in thin layer over painful shingles rash on chest wall 35.44 g 0   lisinopril (PRINIVIL,ZESTRIL) 10 MG tablet  Take 10 mg by mouth daily.     oxyCODONE-acetaminophen (PERCOCET/ROXICET) 5-325 MG tablet Take 1 tablet by mouth every 4 (four) hours as needed for severe pain (severe pain from Shingles rash). (Patient not taking: Reported on 08/13/2019) 30 tablet 0   prochlorperazine (COMPAZINE) 10 MG tablet Take 1 tablet (10 mg total) by mouth every 6 (six) hours as needed (Nausea or vomiting). (Patient not taking: Reported on 08/13/2019) 30 tablet 1   sertraline (ZOLOFT) 100 MG tablet Take 100 mg by mouth daily.     valACYclovir (VALTREX) 500 MG tablet Take 1 tablet (500 mg total) by mouth 2 (two) times daily. (Patient not taking: Reported on 08/13/2019) 60 tablet 2   VITAMIN D PO Take 2,000 Units by mouth.     No current facility-administered medications for this visit.    REVIEW OF SYSTEMS:   .10 Point review of Systems was done is negative except as noted above.  PHYSICAL EXAMINATION: Telemedicine visit  LABORATORY DATA:  I have reviewed the data as listed  . CBC Latest Ref Rng & Units 04/30/2021 03/26/2021 01/08/2021  WBC 4.0 - 10.5 K/uL 68.6(HH) 54.0(HH) 38.0(H)  Hemoglobin 13.0 - 17.0 g/dL 13.9 14.2 14.5  Hematocrit 39.0 - 52.0 % 43.0 42.9 42.4  Platelets 150 - 400 K/uL 251 223 229    . CMP Latest Ref Rng & Units 04/30/2021 03/26/2021 01/08/2021  Glucose 70 - 99 mg/dL 107(H) 105(H) 106(H)  BUN 6 - 20 mg/dL _0 Creatinine 0.61 - 1.24 mg/dL 0.92 0.94 0.84  Sodium 135 - 145 mmol/L 139 142 141  Potassium 3.5 - 5.1 mmol/L 4.1 4.7 4.2  Chloride 98 - 111 mmol/L 107 111 110  CO2 22 - 32 mmol/L _1 Calcium 8.9 - 10.3 mg/dL 9.7 9.5 9.2  Total Protein 6.5 - 8.1 g/dL 6.5 6.5 6.4(L)  Total Bilirubin 0.3 - 1.2 mg/dL 0.5 0.7 0.5  Alkaline Phos 38 - 126 U/L 73 67 73  AST 15 - 41 U/L _2 ALT 0 - 44 U/L _3 . Lab Results  Component Value Date   LDH 229 (H) 04/30/2021    Component     Latest Ref Rng & Units 10/30/2017  HIV Screen 4th Generation wRfx     Non Reactive Non Reactive   HCV Ab     0.0 - 0.9 s/co ratio <0.1  Hepatitis B Surface Ag     Negative Negative  Hep B Core Ab, Tot     Negative Negative      10/25/17 Tissue Flow Cytometry:    10/25/17 Needle/core Bx:   [facility]  Order: [id number]  Status:  Final result   Visible to patient:  No (Not Released) Next appt:  11/07/2017 at 09:00 AM in Radiology ([facility] PET) Dx:  Lymphocytosis  Component 64moago  Specimen Type Comment:   Comment: BLOOD  Cells Counted 200   Cells Analyzed 200   FISH Result Comment:   Comment: NORMAL:  NO CYCLIN D1 OR IGH GENE REARRANGEMENT OBSERVED  Interpretation Comment:   Comment: (NOTE)               nuc ish 11q13(CCND1x2),14q32(IGHx2)[200]       The fluorescence in situ hybridization (FISH) result  was normal. Dual color dual fusion DNA FISH probes targeting  the cyclin D1 gene (CCND1 or BCL1)([employer] and the  heavy chain immunoglobulin gene (IgH) at 14q32 showed two  hybridization signals each with no fusion in all cells  examined. Thus, there was NO apparent rearrangement of the  primary genes involved with mantle cell lymphoma, and to a  lesser extent myeloma.  .Marland Kitchen        RADIOGRAPHIC STUDIES: I have personally reviewed the radiological images as listed and agreed with the findings in the report. No results found. CT chest abdomen pelvis 03/26/2021: IMPRESSION: 1. Interval development of bulky lymphadenopathy in the chest, abdomen, and pelvis. Imaging features compatible with recurrent lymphoma. 2. No hepatosplenomegaly. 3. Enlargement of the pulmonary outflow tract and main pulmonary arteries suggests pulmonary arterial hypertension. 4. 2-3 mm left upper lobe pulmonary nodule was largely obscured on the prior study. Most likely benign, attention on follow-up recommended.     Electronically Signed   By: EMisty StanleyM.D.  CT neck 03/26/2021 IMPRESSION: 1. Limited direct comparison to prior PET-CT (no prior neck CTs) with possible slight  increase in size  of a few left submandibular nodes. Otherwise, similar increased number of lymph nodes throughout the neck without substantial change in multiple mildly enlarged nodes (detailed above). 2. Oval 2.1 x 1.3 cm mass in the posterior right nasal cavity, nonspecific but potentially a polyp or retention cyst. Recommend correlation with direct inspection.     ASSESSMENT & PLAN:   40 y.o. male with  1. 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  -Reviewed 10/11/17 CT of C/A/P which revealed lymphadenopathy throughout the chest, abdomen and pelvis. Most notably in the axilla and abdomen.   -Noted 13q deletion: 13q14.3 is at 47.67% for normal nuclei, and 29.56% with mono allelic deletion of [id number].  -ATM for 11q22.3 revealed 81.67% with normal nuclei and 18.33% with positive nuclei for del 11q22.3 ---> point more to CLL/SLL   02/26/18 CT C/A/P revealed  Marked response to therapy as evidenced by decrease in size or resolution of adenopathy throughout chest, abdomen and pelvis. 2. Hepatic steatosis. Slight marginal irregularity suggests cirrhosis. 3. Enlarged pulmonary arteries, indicative of pulmonary arterial hypertension.   S/p 6 cycles of BR completed on 04/19/18  06/04/18 CT C/A/P revealed Stable CT chest without thoracic lymphadenopathy. Continued further decrease in abdominal and pelvic lymphadenopathy. No new or progressive lymphadenopathy on today's exam. 2. Hepatic steatosis. Subtle nodularity of liver contour raises the question of cirrhosis. 3. Pulmonary arterial enlargement. Pulmonary arterial hypertension a distinct concern.  2. H/o Rituxan infusion reaction-  -Had significant flushing with rituxan needing steroids. Not a candidate in future for rapid infusion  protocol.  3. Sleep apnea Notes he had a sleep study since his last clinic visit and diagnosis was confirmed. -Pt now uses CPAP  PLAN: -Discussed pt labwork 04/30/2021  -Patient had CT chest abdomen pelvis done on 03/26/2021 which shows Interval development of bulky lymphadenopathy in the chest, abdomen, and pelvis. Imaging features compatible with recurrent lymphoma. 2. No hepatosplenomegaly. 3. Enlargement of the pulmonary outflow tract and main pulmonary arteries suggests pulmonary arterial hypertension. 4. 2-3 mm left upper lobe pulmonary nodule was largely obscured on the prior study. Most likely benign, attention on follow-up recommended.  -Patient notes no symptomatic progression of his CLL at this time. -Given his rapidly progressive lymphocytosis and bulky lymphadenopathy in the chest abdomen and pelvis we offered him the option of treatment with Gazyva plus venetoclax and he is enthusiastic about proceeding with treatment. -I discussed the risks and benefits of treatment and the indications and alternatives. -Continue Vitamin D daily. Recommended 1000-2000 IU daily. -Will see back in 3 months with labs.   FOLLOW UP: Please schedule to start patient on Gazyva C1 (D1,D2,D8 and D15)  Labs with each treatment MD visit C1D8 for toxicity check    . The total time spent in the appointment was 32 minutes and more than 50% was on counseling and direct patient cares.   All of the patient's questions were answered with apparent satisfaction. The patient knows to call the clinic with any problems, questions or concerns.   Sullivan Lone MD MS AAHIVMS [facility] Hematology/Oncology Physician [facility]     .Marland Kitchen

## 2021-05-12 ENCOUNTER — Encounter: Payer: Self-pay | Admitting: Hematology

## 2021-05-12 DIAGNOSIS — C9110 Chronic lymphocytic leukemia of B-cell type not having achieved remission: Secondary | ICD-10-CM | POA: Insufficient documentation

## 2021-05-12 NOTE — Progress Notes (Signed)
DISCONTINUE ON PATHWAY REGIMEN - Lymphoma and CLL     A cycle is every 28 days:     Bendamustine      Rituximab      Rituximab   **Always confirm dose/schedule in your pharmacy ordering system**  REASON: Disease Progression PRIOR TREATMENT: [id number]: Bendamustine + Rituximab IV (90/500) q28 Days x 4-6 Cycles TREATMENT RESPONSE: Complete Response (CR)  START ON PATHWAY REGIMEN - Lymphoma and CLL     Cycle 1: A cycle is 28 days:     Venetoclax      Obinutuzumab      Obinutuzumab      Obinutuzumab    Cycle 2: A cycle is 28 days:     Venetoclax      Venetoclax      Venetoclax      Venetoclax      Obinutuzumab    Cycles 3 through 6: A cycle is 28 days:     Venetoclax      Obinutuzumab    Cycles 7 through 12: A cycle is 28 days:     Venetoclax   **Always confirm dose/schedule in your pharmacy ordering system**  Patient Characteristics: Small Lymphocytic Lymphoma (SLL), Treatment Indicated, First Line, 17p del (+) or ATM Deletion or TP53 Mutation Positive Disease Type: Small Lymphocytic Lymphoma (SLL) Disease Type: Not Applicable Disease Type: Not Applicable Treatment Indicated<= Treatment Indicated Line of Therapy: First Line ATM (11q22) Deletion Status: Deletion 17p Deletion Status: Negative TP53 Mutation Status: Negative Intent of Therapy: Non-Curative / Palliative Intent, Discussed with Patient

## 2021-05-12 NOTE — Addendum Note (Signed)
Addended by: Sullivan Lone on: 05/12/2021 05:03 AM   Modules accepted: Orders

## 2021-05-18 ENCOUNTER — Telehealth: Payer: Self-pay | Admitting: Hematology

## 2021-05-18 NOTE — Telephone Encounter (Signed)
Scheduled follow-up appointments per 11/1 los. Patient is aware. 

## 2021-05-25 NOTE — Progress Notes (Signed)
Pharmacist Chemotherapy Monitoring - Initial Assessment    Anticipated start date: 06/02/22    The following has been reviewed per standard work regarding the patient's treatment regimen: The patient's diagnosis, treatment plan and drug doses, and organ/hematologic function Lab orders and baseline tests specific to treatment regimen  The treatment plan start date, drug sequencing, and pre-medications Prior authorization status  Patient's documented medication list, including drug-drug interaction screen and prescriptions for anti-emetics and supportive care specific to the treatment regimen The drug concentrations, fluid compatibility, administration routes, and timing of the medications to be used The patient's access for treatment and lifetime cumulative dose history, if applicable  The patient's medication allergies and previous infusion related reactions, if applicable   Changes made to treatment plan:  N/A  Follow up needed:  Need allopurinol home rx ?   Raul Del [place], [place], BCPS, BCOP 05/25/2021  4:02 PM

## 2021-06-02 ENCOUNTER — Telehealth: Payer: Self-pay | Admitting: *Deleted

## 2021-06-02 ENCOUNTER — Inpatient Hospital Stay: Payer: No Typology Code available for payment source

## 2021-06-02 ENCOUNTER — Other Ambulatory Visit: Payer: Self-pay | Admitting: Hematology

## 2021-06-02 ENCOUNTER — Ambulatory Visit (HOSPITAL_BASED_OUTPATIENT_CLINIC_OR_DEPARTMENT_OTHER): Payer: No Typology Code available for payment source | Admitting: Physician Assistant

## 2021-06-02 ENCOUNTER — Other Ambulatory Visit: Payer: Self-pay

## 2021-06-02 VITALS — BP 102/67 | HR 101 | Temp 97.6°F | Resp 20 | Wt 266.8 lb

## 2021-06-02 DIAGNOSIS — T50905A Adverse effect of unspecified drugs, medicaments and biological substances, initial encounter: Secondary | ICD-10-CM

## 2021-06-02 DIAGNOSIS — C8308 Small cell B-cell lymphoma, lymph nodes of multiple sites: Secondary | ICD-10-CM

## 2021-06-02 DIAGNOSIS — C9110 Chronic lymphocytic leukemia of B-cell type not having achieved remission: Secondary | ICD-10-CM

## 2021-06-02 DIAGNOSIS — Z5111 Encounter for antineoplastic chemotherapy: Secondary | ICD-10-CM | POA: Diagnosis not present

## 2021-06-02 DIAGNOSIS — Z7189 Other specified counseling: Secondary | ICD-10-CM

## 2021-06-02 LAB — CBC WITH DIFFERENTIAL (CANCER CENTER ONLY)
Abs Immature Granulocytes: 0.16 10*3/uL — ABNORMAL HIGH (ref 0.00–0.07)
Basophils Absolute: 0.1 10*3/uL (ref 0.0–0.1)
Basophils Relative: 0 %
Eosinophils Absolute: 0.3 10*3/uL (ref 0.0–0.5)
Eosinophils Relative: 0 %
HCT: 42.9 % (ref 39.0–52.0)
Hemoglobin: 14 g/dL (ref 13.0–17.0)
Immature Granulocytes: 0 %
Lymphocytes Relative: 92 %
Lymphs Abs: 71.9 10*3/uL — ABNORMAL HIGH (ref 0.7–4.0)
MCH: 29.8 pg (ref 26.0–34.0)
MCHC: 32.6 g/dL (ref 30.0–36.0)
MCV: 91.3 fL (ref 80.0–100.0)
Monocytes Absolute: 1.9 10*3/uL — ABNORMAL HIGH (ref 0.1–1.0)
Monocytes Relative: 2 %
Neutro Abs: 4.3 10*3/uL (ref 1.7–7.7)
Neutrophils Relative %: 6 %
Platelet Count: 246 10*3/uL (ref 150–400)
RBC: 4.7 MIL/uL (ref 4.22–5.81)
RDW: 12.9 % (ref 11.5–15.5)
Smear Review: NORMAL
WBC Count: 78.6 10*3/uL (ref 4.0–10.5)
nRBC: 0 % (ref 0.0–0.2)

## 2021-06-02 LAB — CMP (CANCER CENTER ONLY)
ALT: 23 U/L (ref 0–44)
AST: 24 U/L (ref 15–41)
Albumin: 4.4 g/dL (ref 3.5–5.0)
Alkaline Phosphatase: 60 U/L (ref 38–126)
Anion gap: 8 (ref 5–15)
BUN: 16 mg/dL (ref 6–20)
CO2: 23 mmol/L (ref 22–32)
Calcium: 9.1 mg/dL (ref 8.9–10.3)
Chloride: 108 mmol/L (ref 98–111)
Creatinine: 0.85 mg/dL (ref 0.61–1.24)
GFR, Estimated: >60 mL/min (ref >60)
Glucose, Bld: 107 mg/dL — ABNORMAL HIGH (ref 70–99)
Potassium: 4.2 mmol/L (ref 3.5–5.1)
Sodium: 139 mmol/L (ref 135–145)
Total Bilirubin: 0.6 mg/dL (ref 0.3–1.2)
Total Protein: 6.7 g/dL (ref 6.5–8.1)

## 2021-06-02 LAB — LACTATE DEHYDROGENASE: LDH: 183 U/L (ref 98–192)

## 2021-06-02 MED ORDER — ACETAMINOPHEN 325 MG PO TABS
650.0000 mg | ORAL_TABLET | Freq: Once | ORAL | Status: AC
  Administered 2021-06-02: 650 mg via ORAL
  Filled 2021-06-02: qty 2

## 2021-06-02 MED ORDER — FAMOTIDINE 20 MG IN NS 100 ML IVPB
20.0000 mg | Freq: Once | INTRAVENOUS | Status: AC
  Administered 2021-06-02: 20 mg via INTRAVENOUS
  Filled 2021-06-02: qty 100

## 2021-06-02 MED ORDER — MEPERIDINE HCL 25 MG/ML IJ SOLN
12.5000 mg | Freq: Once | INTRAMUSCULAR | Status: AC
  Administered 2021-06-02: 12.5 mg via INTRAVENOUS
  Filled 2021-06-02: qty 1

## 2021-06-02 MED ORDER — SODIUM CHLORIDE 0.9 % IV SOLN
100.0000 mg | Freq: Once | INTRAVENOUS | Status: AC
  Administered 2021-06-02: 100 mg via INTRAVENOUS
  Filled 2021-06-02: qty 4

## 2021-06-02 MED ORDER — DIPHENHYDRAMINE HCL 50 MG/ML IJ SOLN
50.0000 mg | Freq: Once | INTRAMUSCULAR | Status: AC
  Administered 2021-06-02: 50 mg via INTRAVENOUS
  Filled 2021-06-02: qty 1

## 2021-06-02 MED ORDER — SODIUM CHLORIDE 0.9 % IV SOLN
20.0000 mg | Freq: Once | INTRAVENOUS | Status: AC
  Administered 2021-06-02: 20 mg via INTRAVENOUS
  Filled 2021-06-02: qty 20

## 2021-06-02 MED ORDER — MONTELUKAST SODIUM 10 MG PO TABS
10.0000 mg | ORAL_TABLET | Freq: Once | ORAL | Status: AC
  Administered 2021-06-02: 10 mg via ORAL
  Filled 2021-06-02: qty 1

## 2021-06-02 MED ORDER — SODIUM CHLORIDE 0.9 % IV SOLN: Freq: Once | INTRAVENOUS | Status: AC

## 2021-06-02 NOTE — Progress Notes (Signed)
Pt stable at discharge.  VSS, A&O, no complaints.  Pt ambulated to lobby.  Aware of tomorrow's appts.

## 2021-06-02 NOTE — Progress Notes (Signed)
Gazyva infusion restarted at 11:37, paused at 11:55 due to rigors. Demerol order received from [place] PA at chairside and administered (see MAR). Patient rigors then resolved.

## 2021-06-02 NOTE — Telephone Encounter (Signed)
CRITICAL VALUE STICKER  CRITICAL VALUE: WBC 78.6  RECEIVER (on-site recipient of call): Janetta Hora RN  DATE & TIME NOTIFIED: 06/02/21 @ 0810  MESSENGER (representative from lab):  MD NOTIFIED: Dr. Burr Medico  TIME OF NOTIFICATION:0819  RESPONSE:  Dr. Burr Medico aware, will inform Dr. Irene Limbo on his arrival

## 2021-06-02 NOTE — Progress Notes (Signed)
Hypersensitivity Reaction note  Date of event: 06/02/21 Time of event: 1055 Generic name of drug involved: obinutuzumab Name of provider notified of the hypersensitivity reaction: Irene Limbo Was agent that likely caused hypersensitivity reaction added to Allergies List within EMR? yes Chain of events including reaction signs/symptoms, treatment administered, and outcome (e.g., drug resumed; drug discontinued; sent to Emergency Department; etc.)   Started gazyva at 1014 at 64ml/hr. At 1055 Pt c/o dypnea, chest pain and "feeling hot." Pt appeared red and diaphoretic. Infusion stopped. IVF and 02 started and [place], [place] called. See MAR for medication administration. Anda Kraft to discuss re-challenge with Dr. Irene Limbo. Re-challenge [id number]. Stopped due to rigors. Second dose of demoral 12.5mg  given and second re-challenge started 1242. Pt tolerated well and infusion continuing at original rate.  Gillian Shields, RN 06/02/2021 12:39 PM

## 2021-06-02 NOTE — Patient Instructions (Signed)
[facility]  Discharge Instructions: Thank you for choosing [facility] to provide your oncology and hematology care.   If you have a lab appointment with the [facility], please go directly to the [facility] and check in at the registration area.   Wear comfortable clothing and clothing appropriate for easy access to any Portacath or PICC line.   We strive to give you quality time with your provider. You may need to reschedule your appointment if you arrive late (15 or more minutes).  Arriving late affects you and other patients whose appointments are after yours.  Also, if you miss three or more appointments without notifying the office, you may be dismissed from the clinic at the provider's discretion.      For prescription refill requests, have your pharmacy contact our office and allow 72 hours for refills to be completed.    Today you received the following chemotherapy and/or immunotherapy agents gazyva      To help prevent nausea and vomiting after your treatment, we encourage you to take your nausea medication as directed.  BELOW ARE SYMPTOMS THAT SHOULD BE REPORTED IMMEDIATELY: *FEVER GREATER THAN 100.4 F (38 C) OR HIGHER *CHILLS OR SWEATING *NAUSEA AND VOMITING THAT IS NOT CONTROLLED WITH YOUR NAUSEA MEDICATION *UNUSUAL SHORTNESS OF BREATH *UNUSUAL BRUISING OR BLEEDING *URINARY PROBLEMS (pain or burning when urinating, or frequent urination) *BOWEL PROBLEMS (unusual diarrhea, constipation, pain near the anus) TENDERNESS IN MOUTH AND THROAT WITH OR WITHOUT PRESENCE OF ULCERS (sore throat, sores in mouth, or a toothache) UNUSUAL RASH, SWELLING OR PAIN  UNUSUAL VAGINAL DISCHARGE OR ITCHING   Items with * indicate a potential emergency and should be followed up as soon as possible or go to the Emergency Department if any problems should occur.  Please show the CHEMOTHERAPY ALERT CARD or IMMUNOTHERAPY ALERT CARD at check-in to the  Emergency Department and triage nurse.  Should you have questions after your visit or need to cancel or reschedule your appointment, please contact [facility]  Dept: [phone]  and follow the prompts.  Office hours are 8:00 a.m. to 4:30 p.m. Monday - Friday. Please note that voicemails left after 4:00 p.m. may not be returned until the following business day.  We are closed weekends and major holidays. You have access to a nurse at all times for urgent questions. Please call the main number to the clinic Dept: [phone] and follow the prompts.   For any non-urgent questions, you may also contact your provider using MyChart. We now offer e-Visits for anyone 14 and older to request care online for non-urgent symptoms. For details visit [email or web address].   Also download the MyChart app! Go to the app store, search "MyChart", open the app, select [facility], and log in with your MyChart username and password.  Due to Covid, a mask is required upon entering the hospital/clinic. If you do not have a mask, one will be given to you upon arrival. For doctor visits, patients may have 1 support person aged 21 or older with them. For treatment visits, patients cannot have anyone with them due to current Covid guidelines and our immunocompromised population.

## 2021-06-02 NOTE — Progress Notes (Signed)
DATE:  06/02/21   X CHEMO/IMMUNOTHERAPY REACTION           MD: Dr. Irene Limbo   AGENT/BLOOD PRODUCT RECEIVING TODAY:         first time obinutuzumab Dyann Kief)      AGENT/BLOOD PRODUCT RECEIVING IMMEDIATELY PRIOR TO REACTION:          obinutuzumab   VS: BP:     121/81             P:       80       SPO2:       92% on RA  BP:      113/81  P:       81       SPO2:       100% on 1L [place]     REACTION(S):           chest pain, shortness of breath, diaphoresis, flushing   PREMEDS:     pepcid 20 mg, Singulair 10 mg, tylenol 650 mg, Benadryl 50 mg, Decadron 20 mg   INTERVENTION: Solumedrol 60 mg, Pepcid 20 mg demerol 12.5 mg   Review of Systems  Review of Systems  Constitutional:  Positive for chills and diaphoresis. Negative for fever.  HENT:  Negative for facial swelling.   Eyes:  Negative for visual disturbance.  Respiratory:  Positive for shortness of breath. Negative for wheezing.   Cardiovascular:  Positive for chest pain.  Gastrointestinal:  Negative for abdominal pain, diarrhea, nausea and vomiting.  Musculoskeletal:  Negative for arthralgias, myalgias and neck pain.  Skin:  Positive for color change (flushing).  Neurological:  Negative for dizziness, seizures, speech difficulty and headaches.  Psychiatric/Behavioral:  The patient is not nervous/anxious.     Physical Exam  Physical Exam Constitutional:      Appearance: He is diaphoretic.  HENT:     Head: Normocephalic.  Eyes:     Conjunctiva/sclera: Conjunctivae normal.     Pupils: Pupils are equal, round, and reactive to light.  Cardiovascular:     Rate and Rhythm: Normal rate and regular rhythm.     Pulses: Normal pulses.     Heart sounds: Normal heart sounds.  Pulmonary:     Effort: Pulmonary effort is normal. No respiratory distress.     Breath sounds: Normal breath sounds. No stridor. No wheezing, rhonchi or rales.  Chest:     Chest wall: No tenderness.  Abdominal:     Palpations: Abdomen is soft.  Musculoskeletal:      Cervical back: Normal range of motion.  Skin:    Findings: Erythema present.  Neurological:     General: No focal deficit present.     Mental Status: He is alert and oriented to person, place, and time.  Psychiatric:        Mood and Affect: Mood normal.    OUTCOME:      Patient seen twice in infusion for reactions. First at 1101 and the second at 1155.  1101: Infusion stopped, fluids started, solumedrol given. Symptoms resolved, returned to baseline. Dr. Irene Limbo advises to retrial. I remained at the bedside while therapy restarted. Patient tolerated well.   1155: Received call from RN for patient having rigors. No respiratory symptoms and lung exam clear. Patient given demerol 12.5 mg and placed on O2 for comfort. Rigors resolved after intervention. Patient assessed by MD and another 12.5 mg demerol given and then treatment restarted. Patient closely monitored by RN and myself for remaining  of treatment and tolerated without further complications.

## 2021-06-03 ENCOUNTER — Inpatient Hospital Stay: Payer: No Typology Code available for payment source | Attending: Hematology

## 2021-06-03 ENCOUNTER — Other Ambulatory Visit: Payer: Self-pay | Admitting: Hematology

## 2021-06-03 VITALS — BP 123/61 | HR 73 | Temp 97.8°F | Resp 24 | Ht 68.0 in | Wt 272.8 lb

## 2021-06-03 DIAGNOSIS — I10 Essential (primary) hypertension: Secondary | ICD-10-CM | POA: Diagnosis not present

## 2021-06-03 DIAGNOSIS — G4730 Sleep apnea, unspecified: Secondary | ICD-10-CM | POA: Insufficient documentation

## 2021-06-03 DIAGNOSIS — Z5111 Encounter for antineoplastic chemotherapy: Secondary | ICD-10-CM | POA: Diagnosis present

## 2021-06-03 DIAGNOSIS — Z7189 Other specified counseling: Secondary | ICD-10-CM

## 2021-06-03 DIAGNOSIS — K760 Fatty (change of) liver, not elsewhere classified: Secondary | ICD-10-CM | POA: Diagnosis not present

## 2021-06-03 DIAGNOSIS — Z806 Family history of leukemia: Secondary | ICD-10-CM | POA: Insufficient documentation

## 2021-06-03 DIAGNOSIS — C9110 Chronic lymphocytic leukemia of B-cell type not having achieved remission: Secondary | ICD-10-CM | POA: Insufficient documentation

## 2021-06-03 DIAGNOSIS — Z79899 Other long term (current) drug therapy: Secondary | ICD-10-CM | POA: Diagnosis not present

## 2021-06-03 DIAGNOSIS — E7800 Pure hypercholesterolemia, unspecified: Secondary | ICD-10-CM | POA: Diagnosis not present

## 2021-06-03 MED ORDER — ACETAMINOPHEN 500 MG PO TABS
1000.0000 mg | ORAL_TABLET | Freq: Once | ORAL | Status: AC
  Administered 2021-06-03: 1000 mg via ORAL
  Filled 2021-06-03: qty 2

## 2021-06-03 MED ORDER — SODIUM CHLORIDE 0.9 % IV SOLN
900.0000 mg | Freq: Once | INTRAVENOUS | Status: AC
  Administered 2021-06-03: 900 mg via INTRAVENOUS
  Filled 2021-06-03: qty 36

## 2021-06-03 MED ORDER — SODIUM CHLORIDE 0.9 % IV SOLN
Freq: Once | INTRAVENOUS | Status: AC
Start: 2021-06-03 — End: 2021-06-03

## 2021-06-03 MED ORDER — MONTELUKAST SODIUM 10 MG PO TABS
10.0000 mg | ORAL_TABLET | Freq: Once | ORAL | Status: AC
  Administered 2021-06-03: 10 mg via ORAL
  Filled 2021-06-03: qty 1

## 2021-06-03 MED ORDER — DIPHENHYDRAMINE HCL 50 MG/ML IJ SOLN
50.0000 mg | Freq: Once | INTRAMUSCULAR | Status: AC
  Administered 2021-06-03: 50 mg via INTRAVENOUS
  Filled 2021-06-03: qty 1

## 2021-06-03 MED ORDER — SODIUM CHLORIDE 0.9 % IV SOLN
20.0000 mg | Freq: Once | INTRAVENOUS | Status: AC
  Administered 2021-06-03: 20 mg via INTRAVENOUS
  Filled 2021-06-03: qty 20

## 2021-06-03 MED ORDER — MEPERIDINE HCL 25 MG/ML IJ SOLN
25.0000 mg | Freq: Once | INTRAMUSCULAR | Status: AC
  Administered 2021-06-03: 25 mg via INTRAVENOUS
  Filled 2021-06-03: qty 1

## 2021-06-03 MED ORDER — FAMOTIDINE 20 MG IN NS 100 ML IVPB
20.0000 mg | Freq: Once | INTRAVENOUS | Status: AC
  Administered 2021-06-03: 20 mg via INTRAVENOUS
  Filled 2021-06-03: qty 100

## 2021-06-03 NOTE — Progress Notes (Signed)
Pt. tolerated second day of Gazyva infusion without any issues. Titrated per pharmacy, started at 25 mg/hour then 50 mg per hour then 50 mg increments every 30 minutes until max dose 400 mg/hr.

## 2021-06-03 NOTE — Patient Instructions (Signed)
[facility]  Discharge Instructions: Thank you for choosing [facility] to provide your oncology and hematology care.   If you have a lab appointment with the [facility], please go directly to the [facility] and check in at the registration area.   Wear comfortable clothing and clothing appropriate for easy access to any Portacath or PICC line.   We strive to give you quality time with your provider. You may need to reschedule your appointment if you arrive late (15 or more minutes).  Arriving late affects you and other patients whose appointments are after yours.  Also, if you miss three or more appointments without notifying the office, you may be dismissed from the clinic at the provider's discretion.      For prescription refill requests, have your pharmacy contact our office and allow 72 hours for refills to be completed.    Today you received the following chemotherapy and/or immunotherapy agent: Obinutuzumab Dyann Kief).   To help prevent nausea and vomiting after your treatment, we encourage you to take your nausea medication as directed.  BELOW ARE SYMPTOMS THAT SHOULD BE REPORTED IMMEDIATELY: *FEVER GREATER THAN 100.4 F (38 C) OR HIGHER *CHILLS OR SWEATING *NAUSEA AND VOMITING THAT IS NOT CONTROLLED WITH YOUR NAUSEA MEDICATION *UNUSUAL SHORTNESS OF BREATH *UNUSUAL BRUISING OR BLEEDING *URINARY PROBLEMS (pain or burning when urinating, or frequent urination) *BOWEL PROBLEMS (unusual diarrhea, constipation, pain near the anus) TENDERNESS IN MOUTH AND THROAT WITH OR WITHOUT PRESENCE OF ULCERS (sore throat, sores in mouth, or a toothache) UNUSUAL RASH, SWELLING OR PAIN  UNUSUAL VAGINAL DISCHARGE OR ITCHING   Items with * indicate a potential emergency and should be followed up as soon as possible or go to the Emergency Department if any problems should occur.  Please show the CHEMOTHERAPY ALERT CARD or IMMUNOTHERAPY ALERT CARD at  check-in to the Emergency Department and triage nurse.  Should you have questions after your visit or need to cancel or reschedule your appointment, please contact [facility]  Dept: [phone]  and follow the prompts.  Office hours are 8:00 a.m. to 4:30 p.m. Monday - Friday. Please note that voicemails left after 4:00 p.m. may not be returned until the following business day.  We are closed weekends and major holidays. You have access to a nurse at all times for urgent questions. Please call the main number to the clinic Dept: [phone] and follow the prompts.   For any non-urgent questions, you may also contact your provider using MyChart. We now offer e-Visits for anyone 27 and older to request care online for non-urgent symptoms. For details visit [email or web address].   Also download the MyChart app! Go to the app store, search "MyChart", open the app, select , and log in with your MyChart username and password.  Due to Covid, a mask is required upon entering the hospital/clinic. If you do not have a mask, one will be given to you upon arrival. For doctor visits, patients may have 1 support person aged 18 or older with them. For treatment visits, patients cannot have anyone with them due to current Covid guidelines and our immunocompromised population.

## 2021-06-05 ENCOUNTER — Emergency Department (HOSPITAL_COMMUNITY)
Admission: EM | Admit: 2021-06-05 | Discharge: 2021-06-05 | Disposition: A | Discharge status: Home / Self Care | Payer: No Typology Code available for payment source | Attending: Emergency Medicine | Admitting: Emergency Medicine

## 2021-06-05 ENCOUNTER — Encounter (HOSPITAL_COMMUNITY): Payer: Self-pay

## 2021-06-05 ENCOUNTER — Emergency Department (HOSPITAL_COMMUNITY): Payer: No Typology Code available for payment source

## 2021-06-05 ENCOUNTER — Other Ambulatory Visit: Payer: Self-pay

## 2021-06-05 DIAGNOSIS — R509 Fever, unspecified: Secondary | ICD-10-CM | POA: Diagnosis present

## 2021-06-05 DIAGNOSIS — Z20822 Contact with and (suspected) exposure to covid-19: Secondary | ICD-10-CM | POA: Diagnosis not present

## 2021-06-05 DIAGNOSIS — I10 Essential (primary) hypertension: Secondary | ICD-10-CM | POA: Diagnosis not present

## 2021-06-05 DIAGNOSIS — R000 Tachycardia, unspecified: Secondary | ICD-10-CM | POA: Diagnosis not present

## 2021-06-05 DIAGNOSIS — H9202 Otalgia, left ear: Secondary | ICD-10-CM | POA: Diagnosis not present

## 2021-06-05 DIAGNOSIS — J101 Influenza due to other identified influenza virus with other respiratory manifestations: Secondary | ICD-10-CM | POA: Diagnosis not present

## 2021-06-05 DIAGNOSIS — Z79899 Other long term (current) drug therapy: Secondary | ICD-10-CM | POA: Insufficient documentation

## 2021-06-05 HISTORY — DX: Pure hypercholesterolemia, unspecified: E78.00

## 2021-06-05 HISTORY — DX: Leukemia, unspecified not having achieved remission: C95.90

## 2021-06-05 LAB — CBC WITH DIFFERENTIAL/PLATELET
Abs Immature Granulocytes: 0.05 10*3/uL (ref 0.00–0.07)
Basophils Absolute: 0 10*3/uL (ref 0.0–0.1)
Basophils Relative: 1 %
Eosinophils Absolute: 0.1 10*3/uL (ref 0.0–0.5)
Eosinophils Relative: 2 %
HCT: 43.5 % (ref 39.0–52.0)
Hemoglobin: 14.7 g/dL (ref 13.0–17.0)
Immature Granulocytes: 1 %
Lymphocytes Relative: 32 %
Lymphs Abs: 1.9 10*3/uL (ref 0.7–4.0)
MCH: 30.9 pg (ref 26.0–34.0)
MCHC: 33.8 g/dL (ref 30.0–36.0)
MCV: 91.4 fL (ref 80.0–100.0)
Monocytes Absolute: 0.4 10*3/uL (ref 0.1–1.0)
Monocytes Relative: 6 %
Neutro Abs: 3.5 10*3/uL (ref 1.7–7.7)
Neutrophils Relative %: 58 %
Platelets: 141 10*3/uL — ABNORMAL LOW (ref 150–400)
RBC: 4.76 MIL/uL (ref 4.22–5.81)
RDW: 13.5 % (ref 11.5–15.5)
WBC: 6 10*3/uL (ref 4.0–10.5)
nRBC: 0 % (ref 0.0–0.2)

## 2021-06-05 LAB — URINALYSIS, ROUTINE W REFLEX MICROSCOPIC
Bacteria, UA: NONE SEEN
Bilirubin Urine: NEGATIVE
Glucose, UA: NEGATIVE mg/dL
Hgb urine dipstick: NEGATIVE
Ketones, ur: NEGATIVE mg/dL
Leukocytes,Ua: NEGATIVE
Nitrite: NEGATIVE
Protein, ur: NEGATIVE mg/dL
Specific Gravity, Urine: 1.013 (ref 1.005–1.030)
pH: 7 (ref 5.0–8.0)

## 2021-06-05 LAB — COMPREHENSIVE METABOLIC PANEL
ALT: 19 U/L (ref 0–44)
AST: 24 U/L (ref 15–41)
Albumin: 4.2 g/dL (ref 3.5–5.0)
Alkaline Phosphatase: 49 U/L (ref 38–126)
Anion gap: 7 (ref 5–15)
BUN: 14 mg/dL (ref 6–20)
CO2: 25 mmol/L (ref 22–32)
Calcium: 8.9 mg/dL (ref 8.9–10.3)
Chloride: 102 mmol/L (ref 98–111)
Creatinine, Ser: 0.88 mg/dL (ref 0.61–1.24)
GFR, Estimated: >60 mL/min (ref >60)
Glucose, Bld: 101 mg/dL — ABNORMAL HIGH (ref 70–99)
Potassium: 4.1 mmol/L (ref 3.5–5.1)
Sodium: 134 mmol/L — ABNORMAL LOW (ref 135–145)
Total Bilirubin: 0.9 mg/dL (ref 0.3–1.2)
Total Protein: 6.2 g/dL — ABNORMAL LOW (ref 6.5–8.1)

## 2021-06-05 LAB — RESP PANEL BY RT-PCR (FLU A&B, COVID) ARPGX2
Influenza A by PCR: POSITIVE — AB
Influenza B by PCR: NEGATIVE
SARS Coronavirus 2 by RT PCR: NEGATIVE

## 2021-06-05 LAB — LACTIC ACID, PLASMA: Lactic Acid, Venous: 0.8 mmol/L (ref 0.5–1.9)

## 2021-06-05 MED ORDER — METHYLPREDNISOLONE SODIUM SUCC 125 MG IJ SOLR
125.0000 mg | Freq: Once | INTRAMUSCULAR | Status: AC
  Administered 2021-06-05: 125 mg via INTRAVENOUS
  Filled 2021-06-05: qty 2

## 2021-06-05 MED ORDER — ACETAMINOPHEN 500 MG PO TABS
1000.0000 mg | ORAL_TABLET | Freq: Once | ORAL | Status: AC
  Administered 2021-06-05: 1000 mg via ORAL
  Filled 2021-06-05: qty 2

## 2021-06-05 MED ORDER — SODIUM CHLORIDE 0.9 % IV BOLUS
1000.0000 mL | Freq: Once | INTRAVENOUS | Status: AC
  Administered 2021-06-05: 1000 mL via INTRAVENOUS

## 2021-06-05 MED ORDER — ONDANSETRON 4 MG PO TBDP: ORAL_TABLET | ORAL | 0 refills | Status: AC

## 2021-06-05 MED ORDER — OSELTAMIVIR PHOSPHATE 75 MG PO CAPS
75.0000 mg | ORAL_CAPSULE | Freq: Once | ORAL | Status: AC
  Administered 2021-06-05: 75 mg via ORAL
  Filled 2021-06-05: qty 1

## 2021-06-05 MED ORDER — DIPHENHYDRAMINE HCL 50 MG/ML IJ SOLN
25.0000 mg | Freq: Once | INTRAMUSCULAR | Status: AC
  Administered 2021-06-05: 25 mg via INTRAVENOUS
  Filled 2021-06-05: qty 1

## 2021-06-05 MED ORDER — FAMOTIDINE IN NACL 20-0.9 MG/50ML-% IV SOLN
20.0000 mg | Freq: Once | INTRAVENOUS | Status: AC
  Administered 2021-06-05: 20 mg via INTRAVENOUS
  Filled 2021-06-05: qty 50

## 2021-06-05 MED ORDER — OSELTAMIVIR PHOSPHATE 75 MG PO CAPS: 75.0000 mg | ORAL_CAPSULE | Freq: Two times a day (BID) | ORAL | 0 refills | Status: DC

## 2021-06-05 NOTE — ED Notes (Signed)
Provider at bedside to speak with pt

## 2021-06-05 NOTE — ED Notes (Signed)
Pt mild distress noted, some sweat visible to forehead. A/ox4, states has had 2 infusions for leukemia and has had hot and cold sweats, flushing and cough since then. Per his oncology team, they state these are normal reactions to the medicines but if he continues to experience them at home to go to ED. Pt denies dizziness, CP, SOB, ABD pain, n/v/d.

## 2021-06-05 NOTE — ED Notes (Signed)
Pt NAD, a/ox4. Pt verbalizes understanding of all DC and f/u instructions. All questions answered. Pt walks with steady gait to lobby at DC.  ? ?

## 2021-06-05 NOTE — ED Provider Notes (Signed)
[facility] DEPT Provider Note   CSN: [id number] Arrival date & time: 06/05/21  1503     History Chief Complaint  Patient presents with   Medication Reaction    S/p leukemia infusions    Jordan Schroeder is a 40 y.o. male.  Jordan Schroeder is a 40 y.o. male with history of CLL, hypertension, hyperlipidemia, who presents to the emergency department via EMS with concern for possible medication reaction.  Patient reports he was previously in remission with his leukemia but recently had to restart immunologic infusions.  He reports he received Gazyva for the first time on Wednesday 11/30 at the cancer center.  He reports while receiving this medication he began having an infusion reaction with hot sweats, flushing and chills.  He also reports he was having a mild cough during this reaction.  He was treated with Solu-Medrol and Demerol and symptoms subsided and he was able to complete infusion and on Thursday had additional 8-hour infusion of the same medication, was given aggressive premedication regimen and did not have any infusion reactions.  He reports when he went home after the infusion he was doing fine but has started to have continued intermittent cold chills and hot flashes, flushing, sweats and worsening cough yesterday evening and today despite not having any of the medication for the past 48 hours.  He has not started on any other new medications.  Went to [facility] earlier today and was told he had a left ear infection does report he started having some mild ear pain this morning.  He was prescribed cefdinir for this which she has began taking.  He reports he is started to have a worsening cough and mild nasal congestion.  No chest pain or shortness of breath.  No abdominal pain, nausea or vomiting.  He has not noted any hives, itching or other skin changes and never during infusion reaction did he experience any facial swelling, difficulty swallowing,  throat closing sensation or difficulty breathing.  He is concerned if this reaction is persisting even 2 days after he received this medication or if something else may be contributing.  He is followed by Dr. Irene Limbo with oncology.  The history is provided by the patient and medical records.      Past Medical History:  Diagnosis Date   High cholesterol    Hypertension    Leukemia [facility])     Patient Active Problem List   Diagnosis Date Noted   Hypertension 06/05/2021   CLL (chronic lymphocytic leukemia) ([facility]) 05/12/2021   Morbid obesity with BMI of 40.0-44.9, adult (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 No pertinent surgical history.     History reviewed. No pertinent family history.  Social History   Tobacco Use   Smoking status: Never   Smokeless tobacco: Never  Vaping Use   Vaping Use: Never used  Substance Use Topics   Alcohol use: Not Currently   Drug use: Never    Home Medications Prior to Admission medications   Medication Sig Start Date End Date Taking? Authorizing Provider  ondansetron (ZOFRAN-ODT) 4 MG disintegrating tablet 4mg  ODT q4 hours prn nausea/vomit 06/05/21  Yes Jacqlyn Larsen, PA-C  oseltamivir (TAMIFLU) 75 MG capsule Take 1 capsule (75 mg total) by mouth every 12 (twelve) hours.  06/05/21  Yes Jacqlyn Larsen, PA-C  acyclovir (ZOVIRAX) 400 MG tablet Take 1 tablet (400 mg total) by mouth daily. Patient not taking: Reported on 08/13/2019 03/26/18   Brunetta Genera, MD  dexamethasone (DECADRON) 4 MG tablet Take 2 tablets (8 mg total) by mouth daily. Start the day after bendamustine chemotherapy for 2 days. Take with food. Patient not taking: Reported on 08/13/2019 10/30/17   Brunetta Genera, MD  fenofibrate micronized (LOFIBRA) 67 MG capsule  Take 67 mg by mouth daily before breakfast.    [provider]  gabapentin (NEURONTIN) 300 MG capsule Take 1 capsule (300 mg total) by mouth at bedtime. Patient not taking: Reported on 08/13/2019 06/05/19   Brunetta Genera, MD  hydrOXYzine (ATARAX/VISTARIL) 25 MG tablet Take 1 tablet (25 mg total) by mouth 3 (three) times daily as needed for anxiety or nausea. Patient not taking: Reported on 08/13/2019 04/19/18   Brunetta Genera, MD  lidocaine (XYLOCAINE) 5 % ointment Apply 1 application topically as needed. 1-2 times daily in thin layer over painful shingles rash on chest wall 05/23/19   Brunetta Genera, MD  lisinopril (PRINIVIL,ZESTRIL) 10 MG tablet Take 10 mg by mouth daily. 08/22/17   [provider]  oxyCODONE-acetaminophen (PERCOCET/ROXICET) 5-325 MG tablet Take 1 tablet by mouth every 4 (four) hours as needed for severe pain (severe pain from Shingles rash). Patient not taking: Reported on 08/13/2019 05/13/19   Brunetta Genera, MD  prochlorperazine (COMPAZINE) 10 MG tablet Take 1 tablet (10 mg total) by mouth every 6 (six) hours as needed (Nausea or vomiting). Patient not taking: Reported on 08/13/2019 10/30/17   Brunetta Genera, MD  sertraline (ZOLOFT) 100 MG tablet Take 100 mg by mouth daily. 08/21/17   [provider]  valACYclovir (VALTREX) 500 MG tablet Take 1 tablet (500 mg total) by mouth 2 (two) times daily. Patient not taking: Reported on 08/13/2019 06/05/19   Brunetta Genera, MD  VITAMIN D PO Take 2,000 Units by mouth.    [provider]    Allergies    Gazyva [obinutuzumab]  Review of Systems   Review of Systems  Constitutional:  Positive for chills and diaphoresis. Negative for fever.  HENT:  Positive for ear pain and rhinorrhea. Negative for congestion and facial swelling.   Respiratory:  Positive for cough. Negative for shortness of breath.   Cardiovascular:  Negative for chest pain.  Gastrointestinal:  Negative for  abdominal pain, diarrhea, nausea and vomiting.  Genitourinary:  Negative for dysuria.  Musculoskeletal:  Negative for arthralgias and myalgias.  Skin:  Negative for color change and rash.  Neurological:  Negative for dizziness, syncope, light-headedness and headaches.  All other systems reviewed and are negative.  Physical Exam Updated Vital Signs BP (!) 107/55   Pulse (!) 101   Temp 98.4 F (36.9 C) (Oral)   Resp (!) 27   Ht 5\' 8"  (1.727 m)   Wt 122 kg   SpO2 93%   BMI 40.90 kg/m   Physical Exam Vitals and nursing note reviewed.  Constitutional:      General: He is not in acute distress.    Appearance: Normal appearance. He is well-developed. He is diaphoretic.     Comments: Alert, mild diaphoresis noted but otherwise well-appearing and not in acute distress  HENT:     Head: Normocephalic and atraumatic.     Ears:     Comments: Left TM erythematous and bulging, right ear unremarkable    Nose:  Rhinorrhea present.     Mouth/Throat:     Mouth: Mucous membranes are moist.     Pharynx: Oropharynx is clear. No posterior oropharyngeal erythema.     Comments: No facial swelling, posterior oropharynx clear, normal phonation, tolerating secretions Eyes:     General:        Right eye: No discharge.        Left eye: No discharge.     Pupils: Pupils are equal, round, and reactive to light.  Cardiovascular:     Rate and Rhythm: Regular rhythm. Tachycardia present.     Pulses: Normal pulses.     Heart sounds: Normal heart sounds. No murmur heard.   No friction rub. No gallop.     Comments: Tachycardia with regular rhythm on arrival. Pulmonary:     Effort: Pulmonary effort is normal. No respiratory distress.     Breath sounds: Normal breath sounds. No wheezing or rales.     Comments: Respirations equal and unlabored, patient able to speak in full sentences, lungs clear to auscultation bilaterally, occasional dry cough on exam Abdominal:     General: Bowel sounds are normal. There  is no distension.     Palpations: Abdomen is soft. There is no mass.     Tenderness: There is no abdominal tenderness. There is no guarding.     Comments: Abdomen soft, nondistended, nontender to palpation in all quadrants without guarding or peritoneal signs  Musculoskeletal:        General: No deformity.     Cervical back: Neck supple.  Skin:    General: Skin is warm.     Capillary Refill: Capillary refill takes less than 2 seconds.     Findings: No erythema or rash.     Comments: No erythema, urticaria or other rash noted  Neurological:     Mental Status: He is alert and oriented to person, place, and time.     Coordination: Coordination normal.     Comments: Speech is clear, able to follow commands Moves extremities without ataxia, coordination intact  Psychiatric:        Mood and Affect: Mood normal.        Behavior: Behavior normal.    ED Results / Procedures / Treatments   Labs (all labs ordered are listed, but only abnormal results are displayed) Labs Reviewed  RESP PANEL BY RT-PCR (FLU A&B, COVID) ARPGX2 - Abnormal; Notable for the following components:      Result Value   Influenza A by PCR POSITIVE (*)    All other components within normal limits  COMPREHENSIVE METABOLIC PANEL - Abnormal; Notable for the following components:   Sodium 134 (*)    Glucose, Bld 101 (*)    Total Protein 6.2 (*)    All other components within normal limits  CBC WITH DIFFERENTIAL/PLATELET - Abnormal; Notable for the following components:   Platelets 141 (*)    All other components within normal limits  CULTURE, BLOOD (ROUTINE X 2)  CULTURE, BLOOD (ROUTINE X 2)  LACTIC ACID, PLASMA  URINALYSIS, ROUTINE W REFLEX MICROSCOPIC    EKG EKG Interpretation  Date/Time:  Saturday June 05 2021 15:24:46 EST Ventricular Rate:  111 PR Interval:  139 QRS Duration: 96 QT Interval:  320 QTC Calculation: 435 R Axis:   23 Text Interpretation: Sinus tachycardia S1,S2,S3 pattern ST elev,  probable normal early repol pattern Confirmed by Regan Lemming ([id number]) on 06/07/2021 11:30:28 AM  Radiology DG Chest 2 View  Result Date: 06/05/2021 CLINICAL DATA:  Cough, infusion reaction.  Leukemia. EXAM: CHEST - 2 VIEW COMPARISON:  Chest x-ray dated 11/07/2017. FINDINGS: Study is hypoinspiratory with crowding of the perihilar and bibasilar bronchovascular markings. Given the low lung volumes, lungs appear clear. No pleural effusion or pneumothorax is seen. Heart size and mediastinal contours are within normal limits. Osseous structures about the chest are unremarkable. IMPRESSION: Low lung volumes. No active cardiopulmonary disease. No evidence of pneumonia or pulmonary edema. Electronically Signed   By: Franki Cabot M.D.   On: 06/05/2021 16:06    Procedures Procedures   Medications Ordered in ED Medications  sodium chloride 0.9 % bolus 1,000 mL (0 mLs Intravenous Stopped 06/05/21 1820)  famotidine (PEPCID) IVPB 20 mg premix (0 mg Intravenous Stopped 06/05/21 1712)  methylPREDNISolone sodium succinate (SOLU-MEDROL) 125 mg/2 mL injection 125 mg (125 mg Intravenous Given 06/05/21 1630)  diphenhydrAMINE (BENADRYL) injection 25 mg (25 mg Intravenous Given 06/05/21 1632)  acetaminophen (TYLENOL) tablet 1,000 mg (1,000 mg Oral Given 06/05/21 1911)  oseltamivir (TAMIFLU) capsule 75 mg (75 mg Oral Given 06/05/21 1911)    ED Course  I have reviewed the triage vital signs and the nursing notes.  Pertinent labs & imaging results that were available during my care of the patient were reviewed by me and considered in my medical decision making (see chart for details).    MDM Rules/Calculators/A&P                           40 year old male with CLL, recently started new immunologic infusion for treatment of his leukemia, had infusion reaction with flushing, diaphoresis and chills when this medication was initially administered on 1130, received premedications the following day and was able to complete  entire infusion, but yesterday evening and today he has continued to have persistent flushing, hot sweats, cold chills and a started to have worsening cough and is worried that infusion reaction could be continuing.  Patient also reports he went to a minute clinic today and was diagnosed with otitis media of the left ear and prescribed cefdinir.  On arrival patient is mildly diaphoretic but in no acute distress.  He is afebrile but is tachycardic to 118 and mildly tachypneic.  Normotensive, no hypoxia.  I reviewed documentation from patient's infusion reaction.  Given that it has been about 48 hours since he received this infusion it seems unlikely that this would be continuing to cause any symptoms especially if symptoms resolved initially with treatment at the infusion center.  Will assess for potential underlying infection or other cause for symptoms.  In the meantime we will treat with fluids, Solu-Medrol, Benadryl, Pepcid and Tylenol.  I have independently ordered, reviewed and interpreted all labs and imaging: CBC: No leukocytosis, prior white count before receiving Gazyva was 78.6, this is now resolved, normal hemoglobin and platelet count CMP: No significant electrolyte derangements, normal renal and liver function Lactic: WNL UA: No evidence of infection Blood culture sent.  Respiratory panel is positive for influenza A, I certainly think this could be contributing to patient's symptoms.  Chest x-ray is clear with no active cardiopulmonary disease  Patient's tachycardia has resolved and he is feeling improved.  I called and discussed case with patient's oncologist Dr. Irene Limbo, he agrees symptoms are probably more likely related to influenza rather than continued infusion reaction.  Recommends treating patient with Tamiflu given high risk for complication.  Also discussed symptomatic treatment with patient and will have him continue cefdinir for otitis media.  Stressed strict return precautions and  encourage patient to follow-up closely with his oncologist.  He expresses understanding and agreement.  Discharged home in good condition  Final Clinical Impression(s) / ED Diagnoses Final diagnoses:  Influenza A    Rx / DC Orders ED Discharge Orders          Ordered    oseltamivir (TAMIFLU) 75 MG capsule  Every 12 hours        06/05/21 1913    ondansetron (ZOFRAN-ODT) 4 MG disintegrating tablet        06/05/21 1913             Jacqlyn Larsen, [place] 06/07/21 1342    Regan Lemming, MD 06/07/21 1347

## 2021-06-05 NOTE — Discharge Instructions (Addendum)
You have influenza this may be contributing to a lot of your symptoms since you have not had any additional doses of the new infusion medication since Thursday.  Take Tamiflu twice daily for the next 5 days and you can use the prescribed cough medication, complete course of antibiotics for ear infection.  You can use Motrin and Tylenol to treat fevers and chills.  Use prescribed cough medication as needed.  The rest of your lab work and evaluation was reassuring.  Call your oncology clinic for any concerns, return for worsening symptoms.

## 2021-06-05 NOTE — ED Triage Notes (Signed)
Pt BIB EMS for medication reaction/ pt a/ox4, states he has leukemia and was in remission but is now receiving infusions again. Pt states receiving Gazyva infusions. States reaction includes hot/cold flashes, sweats, cough. Denies any uticaria, itching in mouth or swallowing/breathing difficulty.

## 2021-06-09 ENCOUNTER — Ambulatory Visit: Payer: No Typology Code available for payment source

## 2021-06-09 ENCOUNTER — Ambulatory Visit: Payer: No Typology Code available for payment source | Admitting: Hematology

## 2021-06-09 ENCOUNTER — Other Ambulatory Visit: Payer: No Typology Code available for payment source

## 2021-06-10 LAB — CULTURE, BLOOD (ROUTINE X 2): Culture: NO GROWTH

## 2021-06-16 ENCOUNTER — Other Ambulatory Visit: Payer: Self-pay

## 2021-06-16 ENCOUNTER — Inpatient Hospital Stay: Payer: No Typology Code available for payment source

## 2021-06-16 ENCOUNTER — Inpatient Hospital Stay: Payer: No Typology Code available for payment source | Admitting: Hematology

## 2021-06-16 VITALS — BP 108/73 | HR 99 | Temp 99.5°F | Resp 18 | Ht 68.0 in | Wt 268.5 lb

## 2021-06-16 VITALS — BP 112/72 | HR 91 | Temp 98.0°F | Resp 18

## 2021-06-16 DIAGNOSIS — C9110 Chronic lymphocytic leukemia of B-cell type not having achieved remission: Secondary | ICD-10-CM

## 2021-06-16 DIAGNOSIS — Z5111 Encounter for antineoplastic chemotherapy: Secondary | ICD-10-CM | POA: Diagnosis not present

## 2021-06-16 DIAGNOSIS — C8308 Small cell B-cell lymphoma, lymph nodes of multiple sites: Secondary | ICD-10-CM

## 2021-06-16 DIAGNOSIS — Z7189 Other specified counseling: Secondary | ICD-10-CM

## 2021-06-16 LAB — CMP (CANCER CENTER ONLY)
ALT: 31 U/L (ref 0–44)
AST: 25 U/L (ref 15–41)
Albumin: 3.8 g/dL (ref 3.5–5.0)
Alkaline Phosphatase: 63 U/L (ref 38–126)
Anion gap: 7 (ref 5–15)
BUN: 16 mg/dL (ref 6–20)
CO2: 22 mmol/L (ref 22–32)
Calcium: 8.9 mg/dL (ref 8.9–10.3)
Chloride: 111 mmol/L (ref 98–111)
Creatinine: 0.88 mg/dL (ref 0.61–1.24)
GFR, Estimated: >60 mL/min (ref >60)
Glucose, Bld: 119 mg/dL — ABNORMAL HIGH (ref 70–99)
Potassium: 4 mmol/L (ref 3.5–5.1)
Sodium: 140 mmol/L (ref 135–145)
Total Bilirubin: 0.6 mg/dL (ref 0.3–1.2)
Total Protein: 6 g/dL — ABNORMAL LOW (ref 6.5–8.1)

## 2021-06-16 LAB — CBC WITH DIFFERENTIAL (CANCER CENTER ONLY)
Abs Immature Granulocytes: 0.08 10*3/uL — ABNORMAL HIGH (ref 0.00–0.07)
Basophils Absolute: 0 10*3/uL (ref 0.0–0.1)
Basophils Relative: 1 %
Eosinophils Absolute: 0.1 10*3/uL (ref 0.0–0.5)
Eosinophils Relative: 3 %
HCT: 40 % (ref 39.0–52.0)
Hemoglobin: 13.8 g/dL (ref 13.0–17.0)
Immature Granulocytes: 2 %
Lymphocytes Relative: 26 %
Lymphs Abs: 1.3 10*3/uL (ref 0.7–4.0)
MCH: 30.8 pg (ref 26.0–34.0)
MCHC: 34.5 g/dL (ref 30.0–36.0)
MCV: 89.3 fL (ref 80.0–100.0)
Monocytes Absolute: 0.5 10*3/uL (ref 0.1–1.0)
Monocytes Relative: 10 %
Neutro Abs: 2.9 10*3/uL (ref 1.7–7.7)
Neutrophils Relative %: 58 %
Platelet Count: 184 10*3/uL (ref 150–400)
RBC: 4.48 MIL/uL (ref 4.22–5.81)
RDW: 13.2 % (ref 11.5–15.5)
WBC Count: 4.9 10*3/uL (ref 4.0–10.5)
nRBC: 0 % (ref 0.0–0.2)

## 2021-06-16 LAB — LACTATE DEHYDROGENASE: LDH: 216 U/L — ABNORMAL HIGH (ref 98–192)

## 2021-06-16 MED ORDER — SODIUM CHLORIDE 0.9 % IV SOLN: Freq: Once | INTRAVENOUS | Status: AC

## 2021-06-16 MED ORDER — FAMOTIDINE 20 MG IN NS 100 ML IVPB
20.0000 mg | Freq: Once | INTRAVENOUS | Status: AC
  Administered 2021-06-16: 11:00:00 20 mg via INTRAVENOUS
  Filled 2021-06-16: qty 100

## 2021-06-16 MED ORDER — ACETAMINOPHEN 500 MG PO TABS
1000.0000 mg | ORAL_TABLET | Freq: Once | ORAL | Status: AC
  Administered 2021-06-16: 11:00:00 1000 mg via ORAL
  Filled 2021-06-16: qty 2

## 2021-06-16 MED ORDER — DIPHENHYDRAMINE HCL 50 MG/ML IJ SOLN
50.0000 mg | Freq: Once | INTRAMUSCULAR | Status: AC
  Administered 2021-06-16: 11:00:00 50 mg via INTRAVENOUS
  Filled 2021-06-16: qty 1

## 2021-06-16 MED ORDER — MEPERIDINE HCL 25 MG/ML IJ SOLN
25.0000 mg | Freq: Once | INTRAMUSCULAR | Status: AC
  Administered 2021-06-16: 11:00:00 25 mg via INTRAVENOUS
  Filled 2021-06-16: qty 1

## 2021-06-16 MED ORDER — SODIUM CHLORIDE 0.9 % IV SOLN
20.0000 mg | Freq: Once | INTRAVENOUS | Status: AC
  Administered 2021-06-16: 11:00:00 20 mg via INTRAVENOUS
  Filled 2021-06-16: qty 20

## 2021-06-16 MED ORDER — SODIUM CHLORIDE 0.9 % IV SOLN
1000.0000 mg | Freq: Once | INTRAVENOUS | Status: AC
  Administered 2021-06-16: 12:00:00 1000 mg via INTRAVENOUS
  Filled 2021-06-16: qty 40

## 2021-06-16 MED ORDER — MONTELUKAST SODIUM 10 MG PO TABS
10.0000 mg | ORAL_TABLET | Freq: Once | ORAL | Status: AC
  Administered 2021-06-16: 11:00:00 10 mg via ORAL
  Filled 2021-06-16: qty 1

## 2021-06-16 NOTE — Patient Instructions (Signed)
[facility]COL[facility]   Discharge Instructions: Thank you for choosing [facility] to provide your oncology and hematology care.   If you have a lab appointment with the [facility], please go directly to the [facility] and check in at the registration area.   Wear comfortable clothing and clothing appropriate for easy access to any Portacath or PICC line.   We strive to give you quality time with your provider. You may need to reschedule your appointment if you arrive late (15 or more minutes).  Arriving late affects you and other patients whose appointments are after yours.  Also, if you miss three or more appointments without notifying the office, you may be dismissed from the clinic at the providers discretion.      For prescription refill requests, have your pharmacy contact our office and allow 72 hours for refills to be completed.    Today you received the following chemotherapy and/or immunotherapy agents: Obinutuzumab Dyann Kief)      To help prevent nausea and vomiting after your treatment, we encourage you to take your nausea medication as directed.  BELOW ARE SYMPTOMS THAT SHOULD BE REPORTED IMMEDIATELY: *FEVER GREATER THAN 100.4 F (38 C) OR HIGHER *CHILLS OR SWEATING *NAUSEA AND VOMITING THAT IS NOT CONTROLLED WITH YOUR NAUSEA MEDICATION *UNUSUAL SHORTNESS OF BREATH *UNUSUAL BRUISING OR BLEEDING *URINARY PROBLEMS (pain or burning when urinating, or frequent urination) *BOWEL PROBLEMS (unusual diarrhea, constipation, pain near the anus) TENDERNESS IN MOUTH AND THROAT WITH OR WITHOUT PRESENCE OF ULCERS (sore throat, sores in mouth, or a toothache) UNUSUAL RASH, SWELLING OR PAIN  UNUSUAL VAGINAL DISCHARGE OR ITCHING   Items with * indicate a potential emergency and should be followed up as soon as possible or go to the Emergency Department if any problems should occur.  Please show the CHEMOTHERAPY ALERT CARD or IMMUNOTHERAPY ALERT CARD  at check-in to the Emergency Department and triage nurse.  Should you have questions after your visit or need to cancel or reschedule your appointment, please contact [facility]  Dept: [phone]  and follow the prompts.  Office hours are 8:00 a.m. to 4:30 p.m. Monday - Friday. Please note that voicemails left after 4:00 p.m. may not be returned until the following business day.  We are closed weekends and major holidays. You have access to a nurse at all times for urgent questions. Please call the main number to the clinic Dept: [phone] and follow the prompts.   For any non-urgent questions, you may also contact your provider using MyChart. We now offer e-Visits for anyone 64 and older to request care online for non-urgent symptoms. For details visit [email or web address].   Also download the MyChart app! Go to the app store, search "MyChart", open the app, select [facility], and log in with your MyChart username and password.  Due to Covid, a mask is required upon entering the hospital/clinic. If you do not have a mask, one will be given to you upon arrival. For doctor visits, patients may have 1 support person aged 88 or older with them. For treatment visits, patients cannot have anyone with them due to current Covid guidelines and our immunocompromised population.

## 2021-06-18 ENCOUNTER — Encounter: Payer: Self-pay | Admitting: Pharmacist

## 2021-06-18 NOTE — Telephone Encounter (Signed)
Erroneous encounter

## 2021-06-22 ENCOUNTER — Telehealth: Payer: Self-pay | Admitting: Hematology

## 2021-06-22 NOTE — Telephone Encounter (Signed)
Scheduled follow-up appointments per 12/14 los. Patient is aware.

## 2021-06-23 ENCOUNTER — Other Ambulatory Visit: Payer: Self-pay

## 2021-06-23 ENCOUNTER — Inpatient Hospital Stay: Payer: No Typology Code available for payment source

## 2021-06-23 VITALS — BP 104/66 | HR 87 | Temp 98.0°F | Resp 17 | Wt 269.0 lb

## 2021-06-23 DIAGNOSIS — Z7189 Other specified counseling: Secondary | ICD-10-CM

## 2021-06-23 DIAGNOSIS — C9110 Chronic lymphocytic leukemia of B-cell type not having achieved remission: Secondary | ICD-10-CM

## 2021-06-23 DIAGNOSIS — Z5111 Encounter for antineoplastic chemotherapy: Secondary | ICD-10-CM | POA: Diagnosis not present

## 2021-06-23 DIAGNOSIS — C8308 Small cell B-cell lymphoma, lymph nodes of multiple sites: Secondary | ICD-10-CM

## 2021-06-23 LAB — LACTATE DEHYDROGENASE: LDH: 155 U/L (ref 98–192)

## 2021-06-23 LAB — CBC WITH DIFFERENTIAL (CANCER CENTER ONLY)
Abs Immature Granulocytes: 0.16 10*3/uL — ABNORMAL HIGH (ref 0.00–0.07)
Basophils Absolute: 0 10*3/uL (ref 0.0–0.1)
Basophils Relative: 1 %
Eosinophils Absolute: 0.1 10*3/uL (ref 0.0–0.5)
Eosinophils Relative: 2 %
HCT: 41.5 % (ref 39.0–52.0)
Hemoglobin: 13.9 g/dL (ref 13.0–17.0)
Immature Granulocytes: 4 %
Lymphocytes Relative: 18 %
Lymphs Abs: 0.8 10*3/uL (ref 0.7–4.0)
MCH: 30.3 pg (ref 26.0–34.0)
MCHC: 33.5 g/dL (ref 30.0–36.0)
MCV: 90.6 fL (ref 80.0–100.0)
Monocytes Absolute: 0.3 10*3/uL (ref 0.1–1.0)
Monocytes Relative: 7 %
Neutro Abs: 3.1 10*3/uL (ref 1.7–7.7)
Neutrophils Relative %: 68 %
Platelet Count: 204 10*3/uL (ref 150–400)
RBC: 4.58 MIL/uL (ref 4.22–5.81)
RDW: 13.3 % (ref 11.5–15.5)
WBC Count: 4.6 10*3/uL (ref 4.0–10.5)
nRBC: 0 % (ref 0.0–0.2)

## 2021-06-23 LAB — CMP (CANCER CENTER ONLY)
ALT: 29 U/L (ref 0–44)
AST: 23 U/L (ref 15–41)
Albumin: 4.2 g/dL (ref 3.5–5.0)
Alkaline Phosphatase: 57 U/L (ref 38–126)
Anion gap: 7 (ref 5–15)
BUN: 15 mg/dL (ref 6–20)
CO2: 25 mmol/L (ref 22–32)
Calcium: 9.8 mg/dL (ref 8.9–10.3)
Chloride: 108 mmol/L (ref 98–111)
Creatinine: 0.8 mg/dL (ref 0.61–1.24)
GFR, Estimated: >60 mL/min (ref >60)
Glucose, Bld: 136 mg/dL — ABNORMAL HIGH (ref 70–99)
Potassium: 4.4 mmol/L (ref 3.5–5.1)
Sodium: 140 mmol/L (ref 135–145)
Total Bilirubin: 0.6 mg/dL (ref 0.3–1.2)
Total Protein: 6.3 g/dL — ABNORMAL LOW (ref 6.5–8.1)

## 2021-06-23 MED ORDER — SODIUM CHLORIDE 0.9 % IV SOLN: Freq: Once | INTRAVENOUS | Status: AC

## 2021-06-23 MED ORDER — MEPERIDINE HCL 25 MG/ML IJ SOLN
25.0000 mg | Freq: Once | INTRAMUSCULAR | Status: AC
  Administered 2021-06-23: 11:00:00 25 mg via INTRAVENOUS
  Filled 2021-06-23: qty 1

## 2021-06-23 MED ORDER — MONTELUKAST SODIUM 10 MG PO TABS
10.0000 mg | ORAL_TABLET | Freq: Once | ORAL | Status: AC
  Administered 2021-06-23: 10:00:00 10 mg via ORAL
  Filled 2021-06-23: qty 1

## 2021-06-23 MED ORDER — SODIUM CHLORIDE 0.9 % IV SOLN
1000.0000 mg | Freq: Once | INTRAVENOUS | Status: AC
  Administered 2021-06-23: 12:00:00 1000 mg via INTRAVENOUS
  Filled 2021-06-23: qty 40

## 2021-06-23 MED ORDER — DIPHENHYDRAMINE HCL 50 MG/ML IJ SOLN
50.0000 mg | Freq: Once | INTRAMUSCULAR | Status: AC
  Administered 2021-06-23: 10:00:00 50 mg via INTRAVENOUS
  Filled 2021-06-23: qty 1

## 2021-06-23 MED ORDER — ACETAMINOPHEN 500 MG PO TABS
1000.0000 mg | ORAL_TABLET | Freq: Once | ORAL | Status: AC
  Administered 2021-06-23: 10:00:00 1000 mg via ORAL
  Filled 2021-06-23: qty 2

## 2021-06-23 MED ORDER — FAMOTIDINE 20 MG IN NS 100 ML IVPB
20.0000 mg | Freq: Once | INTRAVENOUS | Status: AC
  Administered 2021-06-23: 11:00:00 20 mg via INTRAVENOUS
  Filled 2021-06-23: qty 100

## 2021-06-23 MED ORDER — SODIUM CHLORIDE 0.9 % IV SOLN
20.0000 mg | Freq: Once | INTRAVENOUS | Status: AC
  Administered 2021-06-23: 10:00:00 20 mg via INTRAVENOUS
  Filled 2021-06-23: qty 20

## 2021-07-04 ENCOUNTER — Encounter: Payer: Self-pay | Admitting: Hematology

## 2021-07-04 NOTE — Progress Notes (Signed)
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  Interval History:  [name] is here for follow-up prior to his delayed cycle 1 day 8 of Gazyva.  His treatment last week was deferred since he developed influenza A infection which has been treated with Tamiflu.  He notes that most of his respiratory symptoms have resolved and he is keen to proceed with his treatment. He had some rigors with his first treatment but he did not have any issues with his second treatment after adjusting his premedications. Currently no fevers no chills no night sweats.  Labs today showed normalization of his WBC count at 4.9k, hemoglobin of 13.8 and platelets of 184k CMP unremarkable.  No other acute new symptoms. No other new prohibitive toxicities from his treatment.  MEDICAL HISTORY:  Past Medical History:  Diagnosis Date   High cholesterol    Hypertension    Leukemia ([place])     SURGICAL HISTORY: No past surgical history on file.  SOCIAL HISTORY: Social History   Socioeconomic History   Marital status: Married    Spouse name: Not on file   Number of children: Not on file   Years of education: Not on file   Highest education level: Not on file  Occupational History   Not on file  Tobacco Use   Smoking status: Never   Smokeless tobacco: Never  Vaping Use   Vaping Use: Never used  Substance and Sexual Activity   Alcohol use: Not Currently   Drug use: Never   Sexual activity: Not on file  Other Topics Concern   Not on file  Social History Narrative   Not on file   Social Determinants of Health   Financial Resource Strain: Not on file  Food Insecurity: Not on file   Transportation Needs: Not on file  Physical Activity: Not on file  Stress: Not on file  Social Connections: Not on file  Intimate Partner Violence: Not on file    FAMILY HISTORY: No family history on file.  ALLERGIES:  is allergic to gazyva [obinutuzumab].  MEDICATIONS:  Current Outpatient Medications  Medication Sig Dispense Refill   acyclovir (ZOVIRAX) 400 MG tablet Take 1 tablet (400 mg total) by mouth daily. (Patient not taking: Reported on 08/13/2019) 30 tablet 3   dexamethasone (DECADRON) 4 MG tablet Take 2 tablets (8 mg total) by mouth daily. Start the day after bendamustine chemotherapy for 2 days. Take with food. (Patient not taking: Reported on 08/13/2019) 30 tablet 1   fenofibrate micronized (LOFIBRA) 67 MG capsule Take 67 mg by mouth daily before breakfast.     gabapentin (NEURONTIN) 300 MG capsule Take 1 capsule (300 mg total) by mouth at bedtime. (Patient not taking: Reported on 08/13/2019) 60 capsule 1   hydrOXYzine (ATARAX/VISTARIL) 25 MG tablet Take 1 tablet (25 mg total) by mouth 3 (three) times daily as needed for anxiety or nausea. (Patient not taking: Reported on 08/13/2019) 30 tablet 0   lidocaine (XYLOCAINE) 5 % ointment Apply 1 application topically as needed. 1-2 times daily in thin layer over painful shingles rash on chest wall 35.44 g 0   lisinopril (PRINIVIL,ZESTRIL) 10 MG tablet Take 10 mg by mouth daily.     ondansetron (ZOFRAN-ODT)  4 MG disintegrating tablet 82m ODT q4 hours prn nausea/vomit 10 tablet 0   oseltamivir (TAMIFLU) 75 MG capsule Take 1 capsule (75 mg total) by mouth every 12 (twelve) hours. 10 capsule 0   oxyCODONE-acetaminophen (PERCOCET/ROXICET) 5-325 MG tablet Take 1 tablet by mouth every 4 (four) hours as needed for severe pain (severe pain from Shingles rash). (Patient not taking: Reported on 08/13/2019) 30 tablet 0   prochlorperazine (COMPAZINE) 10 MG tablet Take 1 tablet (10 mg total) by mouth every 6 (six) hours as needed (Nausea or vomiting).  (Patient not taking: Reported on 08/13/2019) 30 tablet 1   sertraline (ZOLOFT) 100 MG tablet Take 100 mg by mouth daily.     valACYclovir (VALTREX) 500 MG tablet Take 1 tablet (500 mg total) by mouth 2 (two) times daily. (Patient not taking: Reported on 08/13/2019) 60 tablet 2   VITAMIN D PO Take 2,000 Units by mouth.     No current facility-administered medications for this visit.    REVIEW OF SYSTEMS:   .10 Point review of Systems was done is negative except as noted above.   PHYSICAL EXAMINATION: .BP 108/73 (BP Location: Left Arm, Patient Position: Sitting)    Pulse 99    Temp 99.5 F (37.5 C) (Temporal)    Resp 18    Ht '5\' 8"'  (1.727 m)    Wt 268 lb 8 oz (121.8 kg)    SpO2 100%    BMI 40.83 kg/m  . GENERAL:alert, in no acute distress and comfortable SKIN: no acute rashes, no significant lesions EYES: conjunctiva are pink and non-injected, sclera anicteric OROPHARYNX: MMM, no exudates, no oropharyngeal erythema or ulceration NECK: supple, no JVD LYMPH: Palpable bilateral likely lymph nodes  LUNGS: clear to auscultation b/l with normal respiratory effort HEART: regular rate & rhythm ABDOMEN:  normoactive bowel sounds , non tender, not distended. Extremity: no pedal edema PSYCH: alert & oriented x 3 with fluent speech NEURO: no focal motor/sensory deficits   LABORATORY DATA:  I have reviewed the data as listed  . CBC Latest Ref Rng & Units 06/16/2021 06/05/2021  WBC 4.0 - 10.5 K/uL 4.9 6.0  Hemoglobin 13.0 - 17.0 g/dL 13.8 14.7  Hematocrit 39.0 - 52.0 % 40.0 43.5  Platelets 150 - 400 K/uL 184 141(L)    . CMP Latest Ref Rng & Units 06/16/2021 06/05/2021  Glucose 70 - 99 mg/dL 119(H) 101(H)  BUN 6 - 20 mg/dL 16 14  Creatinine 0.61 - 1.24 mg/dL 0.88 0.88  Sodium 135 - 145 mmol/L 140 134(L)  Potassium 3.5 - 5.1 mmol/L 4.0 4.1  Chloride 98 - 111 mmol/L 111 102  CO2 22 - 32 mmol/L 22 25  Calcium 8.9 - 10.3 mg/dL 8.9 8.9  Total Protein 6.5 - 8.1 g/dL 6.0(L) 6.2(L)  Total  Bilirubin 0.3 - 1.2 mg/dL 0.6 0.9  Alkaline Phos 38 - 126 U/L 63 49  AST 15 - 41 U/L 25 24  ALT 0 - 44 U/L 31 19   . Lab Results  Component Value Date   LDH 155 06/23/2021    Component     Latest Ref Rng & Units 10/30/2017  HIV Screen 4th Generation wRfx     Non Reactive Non Reactive  HCV Ab     0.0 - 0.9 s/co ratio <0.1  Hepatitis B Surface Ag     Negative Negative  Hep B Core Ab, Tot     Negative Negative      10/25/17 Tissue Flow Cytometry:  10/25/17 Needle/core Bx:   FISH Oncology  Order: [id number]  Status:  Final result   Visible to patient:  No (Not Released) Next appt:  11/07/2017 at 09:00 AM in Radiology ([facility] PET) Dx:  Lymphocytosis  Component 59moago  Specimen Type Comment:   Comment: BLOOD  Cells Counted 200   Cells Analyzed 200   FISH Result Comment:   Comment: NORMAL:  NO CYCLIN D1 OR IGH GENE REARRANGEMENT OBSERVED  Interpretation Comment:   Comment: (NOTE)               nuc ish 11q13(CCND1x2),14q32(IGHx2)[200]       The fluorescence in situ hybridization (FISH) result  was normal. Dual color dual fusion DNA FISH probes targeting  the cyclin D1 gene (CCND1 or BCL1)([employer] and the  heavy chain immunoglobulin gene (IgH) at 14q32 showed two  hybridization signals each with no fusion in all cells  examined. Thus, there was NO apparent rearrangement of the  primary genes involved with mantle cell lymphoma, and to a  lesser extent myeloma.  .Marland Kitchen        RADIOGRAPHIC STUDIES: I have personally reviewed the radiological images as listed and agreed with the findings in the report. DG Chest 2 View  Result Date: 06/05/2021 CLINICAL DATA:  Cough, infusion reaction.  Leukemia. EXAM: CHEST - 2 VIEW COMPARISON:  Chest x-ray dated 11/07/2017. FINDINGS: Study is hypoinspiratory with crowding of the perihilar and bibasilar bronchovascular markings. Given the low lung volumes, lungs appear clear. No pleural effusion or pneumothorax is seen. Heart size and  mediastinal contours are within normal limits. Osseous structures about the chest are unremarkable. IMPRESSION: Low lung volumes. No active cardiopulmonary disease. No evidence of pneumonia or pulmonary edema. Electronically Signed   By: SFranki CabotM.D.   On: 06/05/2021 16:06   CT chest abdomen pelvis 03/26/2021: IMPRESSION: 1. Interval development of bulky lymphadenopathy in the chest, abdomen, and pelvis. Imaging features compatible with recurrent lymphoma. 2. No hepatosplenomegaly. 3. Enlargement of the pulmonary outflow tract and main pulmonary arteries suggests pulmonary arterial hypertension. 4. 2-3 mm left upper lobe pulmonary nodule was largely obscured on the prior study. Most likely benign, attention on follow-up recommended.     Electronically Signed   By: EMisty StanleyM.D.  CT neck 03/26/2021 IMPRESSION: 1. Limited direct comparison to prior PET-CT (no prior neck CTs) with possible slight increase in size of a few left submandibular nodes. Otherwise, similar increased number of lymph nodes throughout the neck without substantial change in multiple mildly enlarged nodes (detailed above). 2. Oval 2.1 x 1.3 cm mass in the posterior right nasal cavity, nonspecific but potentially a polyp or retention cyst. Recommend correlation with direct inspection.     ASSESSMENT & PLAN:   41y.o. male with  1.  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  -Labs upon initial presentation from 09/21/17; WBC at 23.4k, Lymphs Abs at 15.9k -Reviewed 09/21/17 Flow Cytometry results with pt which revealed a CD5+ monoclonal B-Cell population.  -Reviewed 10/11/17 CT of C/A/P which revealed lymphadenopathy throughout the chest, abdomen and pelvis. Most notably in the axilla and abdomen.   -Noted 13q deletion: 13q14.3 is at 47.67% for normal nuclei, and 317.51%with mono allelic deletion of [id number]  -ATM for 11q22.3 revealed 81.67%  with normal nuclei and 18.33% with positive nuclei for del 11q22.3   02/26/18 CT C/A/P revealed  Marked response to therapy as evidenced by decrease  in size or resolution of adenopathy throughout chest, abdomen and pelvis. 2. Hepatic steatosis. Slight marginal irregularity suggests cirrhosis. 3. Enlarged pulmonary arteries, indicative of pulmonary arterial hypertension.   S/p 6 cycles of BR completed on 04/19/18  06/04/18 CT C/A/P revealed Stable CT chest without thoracic lymphadenopathy. Continued further decrease in abdominal and pelvic lymphadenopathy. No new or progressive lymphadenopathy on today's exam. 2. Hepatic steatosis. Subtle nodularity of liver contour raises the question of cirrhosis. 3. Pulmonary arterial enlargement. Pulmonary arterial hypertension a distinct concern.  2. H/o Rituxan infusion reaction-  -Had significant flushing with rituxan needing steroids. Not a candidate in future for rapid infusion protocol.  3.  History of some rigors and flushing with Gazyva controlled with adding Demerol and adjusting premedications.  4. Sleep apnea Notes he had a sleep study since his last clinic visit and diagnosis was confirmed. -uses CPAP  5.  Recent history of influenza A about 7 to 10 days ago treated with Tamiflu.  Symptoms resolved.  PLAN: -Patient tolerated his cycle 1 day 2 of Gazyva without recurrent rigors or flushing after adjusting his premedications and adding Demerol to his premedications. -Labs today show normal CBC and unremarkable CMP. -We reviewed his recent influenza A infection which has been adequately treated and the patient has minimal residual symptoms at this time. -Patient is appropriate to proceed with his delayed cycle 1 day 8 of Gazyva today with appropriate premedications. -We will continue other supportive medications as with his last treatment. -He will monitor his respiratory status and let us know if he develops any fevers or worsening respiratory  status that might suggest secondary bacterial infection after his recent influenza. -Patient's Gazyva orders were reviewed and signed. -We will plan to add venetoclax from cycle 3 of Gazyva to reduce risk of tumor lysis syndrome.    FOLLOW UP: Patient getting cycle 1 day 8 of Gazyva today Please schedule for labs and cycle 1 day 15 of Gazyva in 1 week. Evusheld injection in 1 week with cycle 1 day 15 of Gazyva. Please schedule cycle 2 Day 1 of Gazyva with labs and MD visit as per orders.  Total time spent 33 minutes in reviewing his toxicity from previous treatment, recent influenza infection and treatment, ordering and management of Gazyva treatment.  All of the patient's questions were answered with apparent satisfaction. The patient knows to call the clinic with any problems, questions or concerns.   Sullivan Lone MD MS AAHIVMS [facility] Hematology/Oncology Physician [facility]     .Marland Kitchen

## 2021-07-06 ENCOUNTER — Other Ambulatory Visit: Payer: Self-pay

## 2021-07-06 ENCOUNTER — Inpatient Hospital Stay: Payer: No Typology Code available for payment source

## 2021-07-06 ENCOUNTER — Inpatient Hospital Stay: Payer: No Typology Code available for payment source | Attending: Hematology | Admitting: Hematology

## 2021-07-06 VITALS — BP 109/69 | HR 99 | Temp 97.6°F | Resp 18 | Ht 68.0 in | Wt 272.7 lb

## 2021-07-06 DIAGNOSIS — J40 Bronchitis, not specified as acute or chronic: Secondary | ICD-10-CM

## 2021-07-06 DIAGNOSIS — I2721 Secondary pulmonary arterial hypertension: Secondary | ICD-10-CM | POA: Diagnosis not present

## 2021-07-06 DIAGNOSIS — G4730 Sleep apnea, unspecified: Secondary | ICD-10-CM | POA: Diagnosis not present

## 2021-07-06 DIAGNOSIS — Z5111 Encounter for antineoplastic chemotherapy: Secondary | ICD-10-CM | POA: Insufficient documentation

## 2021-07-06 DIAGNOSIS — E7800 Pure hypercholesterolemia, unspecified: Secondary | ICD-10-CM | POA: Insufficient documentation

## 2021-07-06 DIAGNOSIS — R7402 Elevation of levels of lactic acid dehydrogenase (LDH): Secondary | ICD-10-CM | POA: Diagnosis not present

## 2021-07-06 DIAGNOSIS — Z806 Family history of leukemia: Secondary | ICD-10-CM | POA: Insufficient documentation

## 2021-07-06 DIAGNOSIS — I10 Essential (primary) hypertension: Secondary | ICD-10-CM | POA: Diagnosis not present

## 2021-07-06 DIAGNOSIS — C8308 Small cell B-cell lymphoma, lymph nodes of multiple sites: Secondary | ICD-10-CM

## 2021-07-06 DIAGNOSIS — R7989 Other specified abnormal findings of blood chemistry: Secondary | ICD-10-CM

## 2021-07-06 DIAGNOSIS — C9110 Chronic lymphocytic leukemia of B-cell type not having achieved remission: Secondary | ICD-10-CM | POA: Diagnosis not present

## 2021-07-06 LAB — CBC WITH DIFFERENTIAL (CANCER CENTER ONLY)
Abs Immature Granulocytes: 0.12 10*3/uL — ABNORMAL HIGH (ref 0.00–0.07)
Basophils Absolute: 0.1 10*3/uL (ref 0.0–0.1)
Basophils Relative: 1 %
Eosinophils Absolute: 0.2 10*3/uL (ref 0.0–0.5)
Eosinophils Relative: 2 %
HCT: 41.3 % (ref 39.0–52.0)
Hemoglobin: 13.6 g/dL (ref 13.0–17.0)
Immature Granulocytes: 1 %
Lymphocytes Relative: 15 %
Lymphs Abs: 1.4 10*3/uL (ref 0.7–4.0)
MCH: 30.3 pg (ref 26.0–34.0)
MCHC: 32.9 g/dL (ref 30.0–36.0)
MCV: 92 fL (ref 80.0–100.0)
Monocytes Absolute: 1.3 10*3/uL — ABNORMAL HIGH (ref 0.1–1.0)
Monocytes Relative: 14 %
Neutro Abs: 6 10*3/uL (ref 1.7–7.7)
Neutrophils Relative %: 67 %
Platelet Count: 155 10*3/uL (ref 150–400)
RBC: 4.49 MIL/uL (ref 4.22–5.81)
RDW: 13.5 % (ref 11.5–15.5)
WBC Count: 9 10*3/uL (ref 4.0–10.5)
nRBC: 0 % (ref 0.0–0.2)

## 2021-07-06 LAB — CMP (CANCER CENTER ONLY)
ALT: 66 U/L — ABNORMAL HIGH (ref 0–44)
AST: 53 U/L — ABNORMAL HIGH (ref 15–41)
Albumin: 4 g/dL (ref 3.5–5.0)
Alkaline Phosphatase: 64 U/L (ref 38–126)
Anion gap: 7 (ref 5–15)
BUN: 16 mg/dL (ref 6–20)
CO2: 27 mmol/L (ref 22–32)
Calcium: 9.6 mg/dL (ref 8.9–10.3)
Chloride: 105 mmol/L (ref 98–111)
Creatinine: 0.88 mg/dL (ref 0.61–1.24)
GFR, Estimated: >60 mL/min (ref >60)
Glucose, Bld: 92 mg/dL (ref 70–99)
Potassium: 4.4 mmol/L (ref 3.5–5.1)
Sodium: 139 mmol/L (ref 135–145)
Total Bilirubin: 0.6 mg/dL (ref 0.3–1.2)
Total Protein: 6.4 g/dL — ABNORMAL LOW (ref 6.5–8.1)

## 2021-07-06 LAB — LACTATE DEHYDROGENASE: LDH: 200 U/L — ABNORMAL HIGH (ref 98–192)

## 2021-07-06 MED ORDER — AZITHROMYCIN 250 MG PO TABS: ORAL_TABLET | ORAL | 0 refills | Status: DC

## 2021-07-07 ENCOUNTER — Inpatient Hospital Stay (HOSPITAL_BASED_OUTPATIENT_CLINIC_OR_DEPARTMENT_OTHER): Payer: No Typology Code available for payment source

## 2021-07-07 ENCOUNTER — Other Ambulatory Visit: Payer: Self-pay | Admitting: Hematology

## 2021-07-07 VITALS — BP 96/60 | HR 94 | Temp 97.9°F | Resp 18

## 2021-07-07 DIAGNOSIS — Z5111 Encounter for antineoplastic chemotherapy: Secondary | ICD-10-CM | POA: Diagnosis not present

## 2021-07-07 DIAGNOSIS — C9110 Chronic lymphocytic leukemia of B-cell type not having achieved remission: Secondary | ICD-10-CM

## 2021-07-07 DIAGNOSIS — Z7189 Other specified counseling: Secondary | ICD-10-CM

## 2021-07-07 MED ORDER — MONTELUKAST SODIUM 10 MG PO TABS
10.0000 mg | ORAL_TABLET | Freq: Once | ORAL | Status: AC
  Administered 2021-07-07: 10 mg via ORAL
  Filled 2021-07-07: qty 1

## 2021-07-07 MED ORDER — TIXAGEVIMAB (PART OF EVUSHELD) INJECTION
300.0000 mg | Freq: Once | INTRAMUSCULAR | Status: AC
  Administered 2021-07-07: 300 mg via INTRAMUSCULAR
  Filled 2021-07-07: qty 3

## 2021-07-07 MED ORDER — CILGAVIMAB (PART OF EVUSHELD) INJECTION
300.0000 mg | Freq: Once | INTRAMUSCULAR | Status: AC
  Administered 2021-07-07: 300 mg via INTRAMUSCULAR
  Filled 2021-07-07: qty 3

## 2021-07-07 MED ORDER — EPINEPHRINE 0.3 MG/0.3ML IJ SOAJ: 0.3000 mg | Freq: Once | INTRAMUSCULAR | Status: DC | PRN

## 2021-07-07 MED ORDER — SODIUM CHLORIDE 0.9 % IV SOLN
20.0000 mg | Freq: Once | INTRAVENOUS | Status: AC
  Administered 2021-07-07: 20 mg via INTRAVENOUS
  Filled 2021-07-07: qty 20

## 2021-07-07 MED ORDER — SODIUM CHLORIDE 0.9 % IV SOLN: Freq: Once | INTRAVENOUS | Status: AC

## 2021-07-07 MED ORDER — MEPERIDINE HCL 25 MG/ML IJ SOLN
25.0000 mg | Freq: Once | INTRAMUSCULAR | Status: AC
  Administered 2021-07-07: 25 mg via INTRAVENOUS
  Filled 2021-07-07: qty 1

## 2021-07-07 MED ORDER — ACETAMINOPHEN 500 MG PO TABS
1000.0000 mg | ORAL_TABLET | Freq: Once | ORAL | Status: AC
  Administered 2021-07-07: 1000 mg via ORAL
  Filled 2021-07-07: qty 2

## 2021-07-07 MED ORDER — SODIUM CHLORIDE 0.9 % IV SOLN
1000.0000 mg | Freq: Once | INTRAVENOUS | Status: AC
  Administered 2021-07-07: 1000 mg via INTRAVENOUS
  Filled 2021-07-07: qty 40

## 2021-07-07 MED ORDER — FAMOTIDINE 20 MG IN NS 100 ML IVPB
20.0000 mg | Freq: Once | INTRAVENOUS | Status: AC
  Administered 2021-07-07: 20 mg via INTRAVENOUS
  Filled 2021-07-07: qty 100

## 2021-07-07 MED ORDER — ALBUTEROL SULFATE HFA 108 (90 BASE) MCG/ACT IN AERS: 2.0000 | INHALATION_SPRAY | Freq: Once | RESPIRATORY_TRACT | Status: DC | PRN

## 2021-07-07 MED ORDER — DIPHENHYDRAMINE HCL 50 MG/ML IJ SOLN
50.0000 mg | Freq: Once | INTRAMUSCULAR | Status: AC
  Administered 2021-07-07: 50 mg via INTRAVENOUS
  Filled 2021-07-07: qty 1

## 2021-07-07 NOTE — Patient Instructions (Signed)
[facility] ONCOLOGY   Discharge Instructions: Thank you for choosing [facility] to provide your oncology and hematology care.   If you have a lab appointment with the [facility], please go directly to the [facility] and check in at the registration area.   Wear comfortable clothing and clothing appropriate for easy access to any Portacath or PICC line.   We strive to give you quality time with your provider. You may need to reschedule your appointment if you arrive late (15 or more minutes).  Arriving late affects you and other patients whose appointments are after yours.  Also, if you miss three or more appointments without notifying the office, you may be dismissed from the clinic at the providers discretion.      For prescription refill requests, have your pharmacy contact our office and allow 72 hours for refills to be completed.    Today you received the following chemotherapy and/or immunotherapy agents: Obinutuzumab Dyann Kief)      To help prevent nausea and vomiting after your treatment, we encourage you to take your nausea medication as directed.  BELOW ARE SYMPTOMS THAT SHOULD BE REPORTED IMMEDIATELY: *FEVER GREATER THAN 100.4 F (38 C) OR HIGHER *CHILLS OR SWEATING *NAUSEA AND VOMITING THAT IS NOT CONTROLLED WITH YOUR NAUSEA MEDICATION *UNUSUAL SHORTNESS OF BREATH *UNUSUAL BRUISING OR BLEEDING *URINARY PROBLEMS (pain or burning when urinating, or frequent urination) *BOWEL PROBLEMS (unusual diarrhea, constipation, pain near the anus) TENDERNESS IN MOUTH AND THROAT WITH OR WITHOUT PRESENCE OF ULCERS (sore throat, sores in mouth, or a toothache) UNUSUAL RASH, SWELLING OR PAIN  UNUSUAL VAGINAL DISCHARGE OR ITCHING   Items with * indicate a potential emergency and should be followed up as soon as possible or go to the Emergency Department if any problems should occur.  Please show the CHEMOTHERAPY ALERT CARD or IMMUNOTHERAPY ALERT CARD  at check-in to the Emergency Department and triage nurse.  Should you have questions after your visit or need to cancel or reschedule your appointment, please contact [facility]  Dept: [phone]  and follow the prompts.  Office hours are 8:00 a.m. to 4:30 p.m. Monday - Friday. Please note that voicemails left after 4:00 p.m. may not be returned until the following business day.  We are closed weekends and major holidays. You have access to a nurse at all times for urgent questions. Please call the main number to the clinic Dept: [phone] and follow the prompts.   For any non-urgent questions, you may also contact your provider using MyChart. We now offer e-Visits for anyone 24 and older to request care online for non-urgent symptoms. For details visit [email or web address].   Also download the MyChart app! Go to the app store, search "MyChart", open the app, select [facility], and log in with your MyChart username and password.  Due to Covid, a mask is required upon entering the hospital/clinic. If you do not have a mask, one will be given to you upon arrival. For doctor visits, patients may have 1 support person aged 60 or older with them. For treatment visits, patients cannot have anyone with them due to current Covid guidelines and our immunocompromised population.

## 2021-07-08 ENCOUNTER — Telehealth: Payer: Self-pay | Admitting: Hematology

## 2021-07-08 NOTE — Telephone Encounter (Signed)
Scheduled follow-up appointments per 1/3 los. Patient is aware. °

## 2021-07-09 ENCOUNTER — Other Ambulatory Visit: Payer: No Typology Code available for payment source

## 2021-07-09 ENCOUNTER — Ambulatory Visit: Payer: No Typology Code available for payment source | Admitting: Hematology

## 2021-07-12 ENCOUNTER — Other Ambulatory Visit (HOSPITAL_COMMUNITY): Payer: Self-pay

## 2021-07-12 ENCOUNTER — Encounter: Payer: Self-pay | Admitting: Hematology

## 2021-07-12 ENCOUNTER — Telehealth: Payer: Self-pay | Admitting: Pharmacist

## 2021-07-12 MED ORDER — VENETOCLAX 10 & 50 & 100 MG PO TBPK
ORAL_TABLET | ORAL | 0 refills | Status: DC
  Filled 2021-07-12: qty 42, fill #0
  Filled 2021-07-28: qty 42, 28d supply, fill #0

## 2021-07-12 NOTE — Progress Notes (Signed)
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  Interval History:  [name] is is here for follow-up of his CLL/SLL prior to cycle 2 of Gazyva treatment.  He tolerated his last dose of Gazyva much better after adjustment of his premedications to address his rigors and infusion reaction characterized by flushing. He notes he did not have any significant issues with his last treatment. No persistent respiratory symptoms he notes no fevers no chills no night sweats. Still has some persistent cough with increased secretions.  Discussed and placed patient on Z-Pak for short course of antibiotics. We discussed holding off on adding the venetoclax at this time and to be considered with his third cycle of treatment. No other acute new symptoms. No new lumps or bumps noted. Labs done today reviewed with the patient CBC within normal limits, CMP shows mild elevations of AST and ALT in the context of recent influenza A.  LDH 200.  MEDICAL HISTORY:  Past Medical History:  Diagnosis Date   High cholesterol    Hypertension    Leukemia ([place])     SURGICAL HISTORY: No past surgical history on file.  SOCIAL HISTORY: Social History   Socioeconomic History   Marital status: Married    Spouse name: Not on file   Number of children: Not on file   Years of education: Not on file   Highest education level: Not on file  Occupational History   Not on file  Tobacco Use   Smoking status: Never   Smokeless tobacco: Never  Vaping Use   Vaping Use: Never used  Substance and Sexual Activity   Alcohol use: Not Currently   Drug use: Never   Sexual activity: Not on file  Other Topics  Concern   Not on file  Social History Narrative   Not on file   Social Determinants of Health   Financial Resource Strain: Not on file  Food Insecurity: Not on file  Transportation Needs: Not on file  Physical Activity: Not on file  Stress: Not on file  Social Connections: Not on file  Intimate Partner Violence: Not on file    FAMILY HISTORY: No family history on file.  ALLERGIES:  is allergic to gazyva [obinutuzumab].  MEDICATIONS:  Current Outpatient Medications  Medication Sig Dispense Refill   azithromycin (ZITHROMAX) 250 MG tablet 2 tab (539m) on day 1 then 1 tab (2510m po daily for 4 days 6 each 0   acyclovir (ZOVIRAX) 400 MG tablet Take 1 tablet (400 mg total) by mouth daily. (Patient not taking: Reported on 08/13/2019) 30 tablet 3   dexamethasone (DECADRON) 4 MG tablet Take 2 tablets (8 mg total) by mouth daily. Start the day after bendamustine chemotherapy for 2 days. Take with food. (Patient not taking: Reported on 08/13/2019) 30 tablet 1   fenofibrate micronized (LOFIBRA) 67 MG capsule Take 67 mg by mouth daily before breakfast.     gabapentin (NEURONTIN) 300 MG capsule Take 1 capsule (300 mg total) by mouth at bedtime. (Patient not taking: Reported on 08/13/2019) 60 capsule 1   hydrOXYzine (ATARAX/VISTARIL) 25 MG tablet Take 1 tablet (25 mg total) by mouth 3 (three) times daily as needed for anxiety or nausea. (Patient not taking:  Reported on 08/13/2019) 30 tablet 0   lidocaine (XYLOCAINE) 5 % ointment Apply 1 application topically as needed. 1-2 times daily in thin layer over painful shingles rash on chest wall 35.44 g 0   lisinopril (PRINIVIL,ZESTRIL) 10 MG tablet Take 10 mg by mouth daily.     ondansetron (ZOFRAN-ODT) 4 MG disintegrating tablet 71m ODT q4 hours prn nausea/vomit 10 tablet 0   oseltamivir (TAMIFLU) 75 MG capsule Take 1 capsule (75 mg total) by mouth every 12 (twelve) hours. 10 capsule 0   oxyCODONE-acetaminophen (PERCOCET/ROXICET) 5-325 MG tablet Take 1  tablet by mouth every 4 (four) hours as needed for severe pain (severe pain from Shingles rash). (Patient not taking: Reported on 08/13/2019) 30 tablet 0   prochlorperazine (COMPAZINE) 10 MG tablet Take 1 tablet (10 mg total) by mouth every 6 (six) hours as needed (Nausea or vomiting). (Patient not taking: Reported on 08/13/2019) 30 tablet 1   sertraline (ZOLOFT) 100 MG tablet Take 100 mg by mouth daily.     valACYclovir (VALTREX) 500 MG tablet Take 1 tablet (500 mg total) by mouth 2 (two) times daily. (Patient not taking: Reported on 08/13/2019) 60 tablet 2   VITAMIN D PO Take 2,000 Units by mouth.     No current facility-administered medications for this visit.    REVIEW OF SYSTEMS:   .10 Point review of Systems was done is negative except as noted above.  PHYSICAL EXAMINATION: .BP 109/69 (BP Location: Left Arm, Patient Position: Sitting)    Pulse 99    Temp 97.6 F (36.4 C) (Oral)    Resp 18    Ht _0  (1.727 m)    Wt 272 lb 11.2 oz (123.7 kg)    SpO2 99%    BMI 41.46 kg/m   GENERAL:alert, in no acute distress and comfortable SKIN: no acute rashes, no significant lesions EYES: conjunctiva are pink and non-injected, sclera anicteric OROPHARYNX: MMM, no exudates, no oropharyngeal erythema or ulceration NECK: supple, no JVD LYMPH:  no palpable lymphadenopathy in the cervical, axillary or inguinal regions LUNGS: clear to auscultation b/l with normal respiratory effort HEART: regular rate & rhythm ABDOMEN:  normoactive bowel sounds , non tender, not distended. Extremity: no pedal edema PSYCH: alert & oriented x 3 with fluent speech NEURO: no focal motor/sensory deficits    LABORATORY DATA:  I have reviewed the data as listed  ..Marland KitchenCBC Latest Ref Rng & Units 07/06/2021 06/23/2021 06/16/2021  WBC 4.0 - 10.5 K/uL 9.0 4.6 4.9  Hemoglobin 13.0 - 17.0 g/dL 13.6 13.9 13.8  Hematocrit 39.0 - 52.0 % 41.3 41.5 40.0  Platelets 150 - 400 K/uL 155 204 184   . CMP Latest Ref Rng & Units 07/06/2021  06/23/2021 06/16/2021  Glucose 70 - 99 mg/dL 92 136(H) 119(H)  BUN 6 - 20 mg/dL _1 Creatinine 0.61 - 1.24 mg/dL 0.88 0.80 0.88  Sodium 135 - 145 mmol/L 139 140 140  Potassium 3.5 - 5.1 mmol/L 4.4 4.4 4.0  Chloride 98 - 111 mmol/L 105 108 111  CO2 22 - 32 mmol/L _2 Calcium 8.9 - 10.3 mg/dL 9.6 9.8 8.9  Total Protein 6.5 - 8.1 g/dL 6.4(L) 6.3(L) 6.0(L)  Total Bilirubin 0.3 - 1.2 mg/dL 0.6 0.6 0.6  Alkaline Phos 38 - 126 U/L 64 57 63  AST 15 - 41 U/L 53(H) 23 25  ALT 0 - 44 U/L 66(H) 29 31    . Lab Results  Component Value Date   LDH  200 (H) 07/06/2021    Component     Latest Ref Rng & Units 10/30/2017  HIV Screen 4th Generation wRfx     Non Reactive Non Reactive  HCV Ab     0.0 - 0.9 s/co ratio <0.1  Hepatitis B Surface Ag     Negative Negative  Hep B Core Ab, Tot     Negative Negative      10/25/17 Tissue Flow Cytometry:    10/25/17 Needle/core Bx:   FISH Oncology  Order: [id number]  Status:  Final result   Visible to patient:  No (Not Released) Next appt:  11/07/2017 at 09:00 AM in Radiology ([facility] PET) Dx:  Lymphocytosis  Component 72moago  Specimen Type Comment:   Comment: BLOOD  Cells Counted 200   Cells Analyzed 200   FISH Result Comment:   Comment: NORMAL:  NO CYCLIN D1 OR IGH GENE REARRANGEMENT OBSERVED  Interpretation Comment:   Comment: (NOTE)               nuc ish 11q13(CCND1x2),14q32(IGHx2)[200]       The fluorescence in situ hybridization (FISH) result  was normal. Dual color dual fusion DNA FISH probes targeting  the cyclin D1 gene (CCND1 or BCL1)([employer] and the  heavy chain immunoglobulin gene (IgH) at 14q32 showed two  hybridization signals each with no fusion in all cells  examined. Thus, there was NO apparent rearrangement of the  primary genes involved with mantle cell lymphoma, and to a  lesser extent myeloma.  .Marland Kitchen        RADIOGRAPHIC STUDIES: I have personally reviewed the radiological images as listed and agreed  with the findings in the report. No results found. CT chest abdomen pelvis 03/26/2021: IMPRESSION: 1. Interval development of bulky lymphadenopathy in the chest, abdomen, and pelvis. Imaging features compatible with recurrent lymphoma. 2. No hepatosplenomegaly. 3. Enlargement of the pulmonary outflow tract and main pulmonary arteries suggests pulmonary arterial hypertension. 4. 2-3 mm left upper lobe pulmonary nodule was largely obscured on the prior study. Most likely benign, attention on follow-up recommended.     Electronically Signed   By: EMisty StanleyM.D.  CT neck 03/26/2021 IMPRESSION: 1. Limited direct comparison to prior PET-CT (no prior neck CTs) with possible slight increase in size of a few left submandibular nodes. Otherwise, similar increased number of lymph nodes throughout the neck without substantial change in multiple mildly enlarged nodes (detailed above). 2. Oval 2.1 x 1.3 cm mass in the posterior right nasal cavity, nonspecific but potentially a polyp or retention cyst. Recommend correlation with direct inspection.     ASSESSMENT & PLAN:   41y.o. male with  1.  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 Noted 13q deletion: 13q14.3 is at 47.67% for normal nuclei, and 340.98%with mono allelic deletion of [id number]  -ATM for 11q22.3 revealed 81.67% with normal nuclei and 18.33% with positive nuclei for del 11q22.3   02/26/18 CT C/A/P revealed  Marked response to therapy as evidenced by decrease in size or resolution of adenopathy throughout chest, abdomen and pelvis. 2. Hepatic steatosis. Slight marginal irregularity suggests cirrhosis. 3. Enlarged pulmonary arteries, indicative of pulmonary arterial hypertension.   S/p 6 cycles of BR completed on 04/19/18 for first-line treatment  06/04/18 CT C/A/P revealed Stable CT chest without thoracic lymphadenopathy. Continued further decrease in abdominal and  pelvic lymphadenopathy. No new or progressive lymphadenopathy on today's exam. 2. Hepatic steatosis. Subtle  nodularity of liver contour raises the question of cirrhosis. 3. Pulmonary arterial enlargement. Pulmonary arterial hypertension a distinct concern.  2. H/o Rituxan infusion reaction-  -Had significant flushing with rituxan needing steroids. Not a candidate in future for rapid infusion protocol.  3.  History of some rigors and flushing with Gazyva controlled with adding Demerol and adjusting premedications.  4. Sleep apnea Notes he had a sleep study since his last clinic visit and diagnosis was confirmed. -uses CPAP  5.  Recent history of influenza A about 7 to 10 days ago treated with Tamiflu.  Symptoms resolved.  He now has some mild persistent cough with increased secretions possible mild bronchitis -Given prescription for Z-Pak  6.  Mildly elevated transaminases less than 2 times upper limit of normal likely related to his recent influenza A and Tamiflu.  We will monitor PLAN: -Patient tolerated cycle 1 day 15 of Gazyva without recurrent rigors or flushing after adjusting his premedications and adding Demerol to his premeds. -Labs today show normal CBC and CMP with mild elevations in transaminases which will be monitored. -He notes some symptoms of mild bronchitis for which he was given a Z-Pak since he is on immunosuppressive therapies. -Patient will proceed with cycle 2-day 1 of Gazyva today. -He will call with any other acute new symptoms. -We shall plan to delay starting his venetoclax until cycle 3 to allow him to recover from his respiratory illness and his recent influenza A as well as to reduce risk of uncontrolled tumor lysis syndrome. -I shall go ahead and order his venetoclax starter pack to have this available to start with cycle 3 of treatment   FOLLOW UP: Cycle 2 of Gazyva scheduled tomorrow. Please schedule cycle 3 of Gazyva with labs and MD visit in 4 weeks as  ordered  . The total time spent in the appointment was 30 mins including review of lab results, evaluation and management of bronchitis, toxicity check and ordering and management of Gazyva and venetoclax treatment, patient counseling and documentation.   All of the patient's questions were answered with apparent satisfaction. The patient knows to call the clinic with any problems, questions or concerns.   Sullivan Lone MD MS AAHIVMS [facility] Hematology/Oncology Physician [facility]     .Marland Kitchen

## 2021-07-12 NOTE — Telephone Encounter (Signed)
Oral Oncology Pharmacist Encounter  Received new prescription for Venclexta (venetoclax) for the treatment of CLL in conjunction with obinutuzumab, planned duration of venetoclax likely ~1 year.  CMP and CBC w/ Diff from 07/06/21 assessed, noted AST and ALT slightly above ULN (AST 53 U/L and ALT 66 U/L), but T. Bili WNL. LDH from 07/06/21 also assessed - noted this was 200 U/L (up from 155 U/L). All other labs stable. Prescription dose and frequency assessed for appropriateness. Appropriate for therapy initiation. Patient will start with Cycle 3 of obinutuzumab per Dr. Irene Limbo. Will confirm with MD if he plans to start allopurinol for TLS PPX.   Current medication list in Epic reviewed, no relevant/significant DDIs with Venclexta identified.  Evaluated chart and no patient barriers to medication adherence noted.   Prescription has been e-scribed to the [facility] for benefits analysis and approval.  Oral Oncology Clinic will continue to follow for insurance authorization, copayment issues, initial counseling and start date.  Leron Croak, PharmD, BCPS Hematology/Oncology Clinical Pharmacist [facility] [place] [phone] 07/12/2021 9:32 AM

## 2021-07-13 ENCOUNTER — Other Ambulatory Visit (HOSPITAL_COMMUNITY): Payer: Self-pay

## 2021-07-14 ENCOUNTER — Telehealth: Payer: Self-pay

## 2021-07-14 ENCOUNTER — Encounter: Payer: Self-pay | Admitting: Hematology

## 2021-07-14 ENCOUNTER — Other Ambulatory Visit (HOSPITAL_COMMUNITY): Payer: Self-pay

## 2021-07-14 NOTE — Telephone Encounter (Signed)
Obtained Venclexta copay card for patient  [facility] [id number]  PCN [id number] Group [id number] ID [id number] Payer ID [id number]  Lenay Lovejoy CPHT [facility] Patient [place] Phone [phone] Fax [phone] 07/14/2021 1:54 PM

## 2021-07-14 NOTE — Telephone Encounter (Signed)
Oral Oncology Patient Advocate Encounter   Received notification from [place] that prior authorization for Venclexta is required.   PA submitted on CoverMyMeds Key [id number] Status is pending   Oral Oncology Clinic will continue to follow.  [place] Patient [place] Phone [phone] Fax [phone] 07/14/2021 2:24 PM

## 2021-07-14 NOTE — Telephone Encounter (Signed)
Oral Oncology Patient Advocate Encounter  Prior Authorization for Jordan Schroeder has been approved.    PA# [id number] Effective dates: 07/14/21 through 07/14/22  Patients co-pay is $932  Oral Oncology Clinic will continue to follow.   [place] Patient [place] Phone [phone] Fax [phone] 07/14/2021 2:26 PM

## 2021-07-19 ENCOUNTER — Other Ambulatory Visit (HOSPITAL_COMMUNITY): Payer: Self-pay

## 2021-07-19 ENCOUNTER — Encounter: Payer: Self-pay | Admitting: Hematology

## 2021-07-21 ENCOUNTER — Telehealth: Payer: Self-pay

## 2021-07-21 NOTE — Telephone Encounter (Signed)
Patient notified of completion of Attending Physician's Statement for Disability claim through [employer]. Fax transmission confirmation received and copy mailed to Patient as requested.

## 2021-07-28 ENCOUNTER — Other Ambulatory Visit (HOSPITAL_COMMUNITY): Payer: Self-pay

## 2021-07-28 NOTE — Telephone Encounter (Signed)
Oral Chemotherapy Pharmacist Encounter   Spoke with patient today to follow up regarding patient's oral chemotherapy medication: Venclexta (venetoclax)  Patient has been set up for shipment of Venclexta from [facility], planned delivery to patient's home is 07/29/21.  I spoke with patient and he knows he will not start this until after labs and MD appt on 08/04/21. I will meet patient in infusion on 08/04/21 for initial medication counseling session.   Patient knows to call the office with questions or concerns.  Leron Croak, PharmD, BCPS Hematology/Oncology Clinical Pharmacist [facility] [place] [phone] 07/28/2021 2:26 PM

## 2021-07-29 ENCOUNTER — Other Ambulatory Visit (HOSPITAL_COMMUNITY): Payer: Self-pay

## 2021-08-02 ENCOUNTER — Other Ambulatory Visit: Payer: Self-pay | Admitting: Hematology

## 2021-08-02 DIAGNOSIS — C9110 Chronic lymphocytic leukemia of B-cell type not having achieved remission: Secondary | ICD-10-CM

## 2021-08-02 MED ORDER — ALLOPURINOL 100 MG PO TABS: 100.0000 mg | ORAL_TABLET | Freq: Two times a day (BID) | ORAL | 2 refills | Status: DC

## 2021-08-04 ENCOUNTER — Inpatient Hospital Stay: Payer: No Typology Code available for payment source

## 2021-08-04 ENCOUNTER — Inpatient Hospital Stay: Payer: No Typology Code available for payment source | Admitting: Hematology

## 2021-08-04 ENCOUNTER — Other Ambulatory Visit: Payer: Self-pay

## 2021-08-04 ENCOUNTER — Inpatient Hospital Stay: Payer: No Typology Code available for payment source | Attending: Hematology

## 2021-08-04 VITALS — BP 107/74 | HR 60 | Temp 98.8°F | Resp 20 | Wt 277.2 lb

## 2021-08-04 VITALS — BP 117/71 | HR 86 | Temp 97.8°F | Resp 18

## 2021-08-04 DIAGNOSIS — C9110 Chronic lymphocytic leukemia of B-cell type not having achieved remission: Secondary | ICD-10-CM | POA: Diagnosis not present

## 2021-08-04 DIAGNOSIS — E7800 Pure hypercholesterolemia, unspecified: Secondary | ICD-10-CM | POA: Diagnosis not present

## 2021-08-04 DIAGNOSIS — I10 Essential (primary) hypertension: Secondary | ICD-10-CM | POA: Diagnosis not present

## 2021-08-04 DIAGNOSIS — Z5111 Encounter for antineoplastic chemotherapy: Secondary | ICD-10-CM

## 2021-08-04 DIAGNOSIS — Z7189 Other specified counseling: Secondary | ICD-10-CM

## 2021-08-04 DIAGNOSIS — G4730 Sleep apnea, unspecified: Secondary | ICD-10-CM | POA: Diagnosis not present

## 2021-08-04 DIAGNOSIS — I2721 Secondary pulmonary arterial hypertension: Secondary | ICD-10-CM | POA: Diagnosis not present

## 2021-08-04 DIAGNOSIS — R7401 Elevation of levels of liver transaminase levels: Secondary | ICD-10-CM | POA: Insufficient documentation

## 2021-08-04 DIAGNOSIS — C8308 Small cell B-cell lymphoma, lymph nodes of multiple sites: Secondary | ICD-10-CM

## 2021-08-04 DIAGNOSIS — Z79899 Other long term (current) drug therapy: Secondary | ICD-10-CM | POA: Diagnosis not present

## 2021-08-04 LAB — CBC WITH DIFFERENTIAL (CANCER CENTER ONLY)
Abs Immature Granulocytes: 0.04 10*3/uL (ref 0.00–0.07)
Basophils Absolute: 0 10*3/uL (ref 0.0–0.1)
Basophils Relative: 1 %
Eosinophils Absolute: 0.2 10*3/uL (ref 0.0–0.5)
Eosinophils Relative: 4 %
HCT: 41.4 % (ref 39.0–52.0)
Hemoglobin: 14.6 g/dL (ref 13.0–17.0)
Immature Granulocytes: 1 %
Lymphocytes Relative: 39 %
Lymphs Abs: 1.7 10*3/uL (ref 0.7–4.0)
MCH: 31.2 pg (ref 26.0–34.0)
MCHC: 35.3 g/dL (ref 30.0–36.0)
MCV: 88.5 fL (ref 80.0–100.0)
Monocytes Absolute: 0.5 10*3/uL (ref 0.1–1.0)
Monocytes Relative: 11 %
Neutro Abs: 2 10*3/uL (ref 1.7–7.7)
Neutrophils Relative %: 44 %
Platelet Count: 186 10*3/uL (ref 150–400)
RBC: 4.68 MIL/uL (ref 4.22–5.81)
RDW: 13.2 % (ref 11.5–15.5)
WBC Count: 4.4 10*3/uL (ref 4.0–10.5)
nRBC: 0 % (ref 0.0–0.2)

## 2021-08-04 LAB — CMP (CANCER CENTER ONLY)
ALT: 32 U/L (ref 0–44)
AST: 29 U/L (ref 15–41)
Albumin: 4.7 g/dL (ref 3.5–5.0)
Alkaline Phosphatase: 55 U/L (ref 38–126)
Anion gap: 6 (ref 5–15)
BUN: 17 mg/dL (ref 6–20)
CO2: 25 mmol/L (ref 22–32)
Calcium: 9.8 mg/dL (ref 8.9–10.3)
Chloride: 107 mmol/L (ref 98–111)
Creatinine: 0.86 mg/dL (ref 0.61–1.24)
GFR, Estimated: >60 mL/min (ref >60)
Glucose, Bld: 118 mg/dL — ABNORMAL HIGH (ref 70–99)
Potassium: 4.2 mmol/L (ref 3.5–5.1)
Sodium: 138 mmol/L (ref 135–145)
Total Bilirubin: 0.8 mg/dL (ref 0.3–1.2)
Total Protein: 6.7 g/dL (ref 6.5–8.1)

## 2021-08-04 LAB — LACTATE DEHYDROGENASE: LDH: 166 U/L (ref 98–192)

## 2021-08-04 MED ORDER — MONTELUKAST SODIUM 10 MG PO TABS
10.0000 mg | ORAL_TABLET | Freq: Once | ORAL | Status: AC
  Administered 2021-08-04: 10 mg via ORAL
  Filled 2021-08-04: qty 1

## 2021-08-04 MED ORDER — ACETAMINOPHEN 500 MG PO TABS
1000.0000 mg | ORAL_TABLET | Freq: Once | ORAL | Status: AC
  Administered 2021-08-04: 1000 mg via ORAL
  Filled 2021-08-04: qty 2

## 2021-08-04 MED ORDER — SODIUM CHLORIDE 0.9 % IV SOLN
1000.0000 mg | Freq: Once | INTRAVENOUS | Status: AC
  Administered 2021-08-04: 1000 mg via INTRAVENOUS
  Filled 2021-08-04: qty 40

## 2021-08-04 MED ORDER — MEPERIDINE HCL 25 MG/ML IJ SOLN
25.0000 mg | Freq: Once | INTRAMUSCULAR | Status: AC
  Administered 2021-08-04: 25 mg via INTRAVENOUS
  Filled 2021-08-04: qty 1

## 2021-08-04 MED ORDER — SODIUM CHLORIDE 0.9 % IV SOLN
20.0000 mg | Freq: Once | INTRAVENOUS | Status: AC
  Administered 2021-08-04: 20 mg via INTRAVENOUS
  Filled 2021-08-04: qty 20

## 2021-08-04 MED ORDER — DIPHENHYDRAMINE HCL 50 MG/ML IJ SOLN
50.0000 mg | Freq: Once | INTRAMUSCULAR | Status: AC
  Administered 2021-08-04: 50 mg via INTRAVENOUS
  Filled 2021-08-04: qty 1

## 2021-08-04 MED ORDER — FAMOTIDINE IN NACL 20-0.9 MG/50ML-% IV SOLN
20.0000 mg | Freq: Once | INTRAVENOUS | Status: AC
  Administered 2021-08-04: 20 mg via INTRAVENOUS
  Filled 2021-08-04: qty 50

## 2021-08-04 MED ORDER — SODIUM CHLORIDE 0.9 % IV SOLN: Freq: Once | INTRAVENOUS | Status: AC

## 2021-08-04 NOTE — Telephone Encounter (Signed)
Oral Chemotherapy Pharmacist Encounter  I met with patient in infusion for overview of: Venclexta (venetoclax) for the treatment of CLL in conjunction with obinutuzumab. Planned duration likely ~ 1 year.  Counseled patient on administration, dosing, side effects, monitoring, drug-food interactions, safe handling, storage, and disposal.  Venclexta will be administered outpatient based on tumor lysis syndrome (TLS) risk assessment to be low risk. Per Dr. Irene Limbo patient will have once weekly labs during the initial 4-week ramp up phase. Patient aware of importance to increase hydration while on Venclexta.    Week 1: Patient will take Venclexta 77m tablets, 2 tablets (20 mg) by mouth once daily with food and water for 7 days. Week 2: Patient will take Venclexta 575mtablets, 1 tablet by mouth once daily with food and water for 7 days.   Subsequent ramp-up doses of 10073maily x 7 days, 200m45mily x 7 days, and 400mg65mly (target maintenance dose) will be monitored per MD.   VenclLynita Lombardt date: 08/04/21  Adverse effects include but are not limited to: TLS, decreased blood counts, electrolyte abnormalities, diarrhea, nausea, fatigue, arthralgias/myalgias, and upper respiratory tract infection.    Patient has anti-emetic on hand and knows to take it if nausea develops.   Patient will obtain anti diarrheal and alert the office of 4 or more loose stools above baseline.  Discussed importance of allopurinol while on Venclexta. Patient will take allopurinol 100 mg tablet, 1 tablet by mouth twice daily. He started this on 08/04/21.  Reviewed with patient importance of keeping a medication schedule and plan for any missed doses. No barriers to medication adherence identified.  Medication reconciliation performed and medication/allergy list updated.  All questions answered.  Mr. RamseSahred understanding and appreciation.   Medication education handout and medication calendar given to patient.  Patient knows to call the office with questions or concerns. Oral Chemotherapy Clinic phone number provided to patient.   RebecLeron CroakrmD, BCPS Hematology/Oncology Clinical Pharmacist [facility] [phone] 11:07 AM

## 2021-08-04 NOTE — Patient Instructions (Signed)
[facility] ONCOLOGY  Discharge Instructions: Thank you for choosing [facility] to provide your oncology and hematology care.   If you have a lab appointment with the [facility], please go directly to the [facility] and check in at the registration area.   Wear comfortable clothing and clothing appropriate for easy access to any Portacath or PICC line.   We strive to give you quality time with your provider. You may need to reschedule your appointment if you arrive late (15 or more minutes).  Arriving late affects you and other patients whose appointments are after yours.  Also, if you miss three or more appointments without notifying the office, you may be dismissed from the clinic at the providers discretion.      For prescription refill requests, have your pharmacy contact our office and allow 72 hours for refills to be completed.    Today you received the following chemotherapy and/or immunotherapy agents: Gazyva      To help prevent nausea and vomiting after your treatment, we encourage you to take your nausea medication as directed.  BELOW ARE SYMPTOMS THAT SHOULD BE REPORTED IMMEDIATELY: *FEVER GREATER THAN 100.4 F (38 C) OR HIGHER *CHILLS OR SWEATING *NAUSEA AND VOMITING THAT IS NOT CONTROLLED WITH YOUR NAUSEA MEDICATION *UNUSUAL SHORTNESS OF BREATH *UNUSUAL BRUISING OR BLEEDING *URINARY PROBLEMS (pain or burning when urinating, or frequent urination) *BOWEL PROBLEMS (unusual diarrhea, constipation, pain near the anus) TENDERNESS IN MOUTH AND THROAT WITH OR WITHOUT PRESENCE OF ULCERS (sore throat, sores in mouth, or a toothache) UNUSUAL RASH, SWELLING OR PAIN  UNUSUAL VAGINAL DISCHARGE OR ITCHING   Items with * indicate a potential emergency and should be followed up as soon as possible or go to the Emergency Department if any problems should occur.  Please show the CHEMOTHERAPY ALERT CARD or IMMUNOTHERAPY ALERT CARD at check-in to the  Emergency Department and triage nurse.  Should you have questions after your visit or need to cancel or reschedule your appointment, please contact [facility]  Dept: [phone]  and follow the prompts.  Office hours are 8:00 a.m. to 4:30 p.m. Monday - Friday. Please note that voicemails left after 4:00 p.m. may not be returned until the following business day.  We are closed weekends and major holidays. You have access to a nurse at all times for urgent questions. Please call the main number to the clinic Dept: [phone] and follow the prompts.   For any non-urgent questions, you may also contact your provider using MyChart. We now offer e-Visits for anyone 48 and older to request care online for non-urgent symptoms. For details visit [email or web address].   Also download the MyChart app! Go to the app store, search "MyChart", open the app, select [facility], and log in with your MyChart username and password.  Due to Covid, a mask is required upon entering the hospital/clinic. If you do not have a mask, one will be given to you upon arrival. For doctor visits, patients may have 1 support person aged 4 or older with them. For treatment visits, patients cannot have anyone with them due to current Covid guidelines and our immunocompromised population.

## 2021-08-05 ENCOUNTER — Telehealth: Payer: Self-pay | Admitting: Hematology

## 2021-08-05 NOTE — Telephone Encounter (Signed)
Scheduled follow-up appointments per 2/1 los. Patient is aware.

## 2021-08-10 ENCOUNTER — Other Ambulatory Visit: Payer: Self-pay

## 2021-08-10 ENCOUNTER — Encounter: Payer: Self-pay | Admitting: Hematology

## 2021-08-10 DIAGNOSIS — C9110 Chronic lymphocytic leukemia of B-cell type not having achieved remission: Secondary | ICD-10-CM

## 2021-08-10 NOTE — Progress Notes (Addendum)
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  Interval History:  Patient is here for follow-up bilateral third cycle of Gazyva and for continued evaluation and management of his CLL. He notes no acute toxicities from his last cycle of Gazyva.  He has restarted his venetoclax from today and notes no issues with tolerance. Has been consuming adequate water.  Good p.o. intake. No new skin rashes. No diarrhea. No other acute new symptoms. Labs done today reviewed with him in detail.   MEDICAL HISTORY:  Past Medical History:  Diagnosis Date   High cholesterol    Hypertension    Leukemia ([place])     SURGICAL HISTORY: No past surgical history on file.  SOCIAL HISTORY: Social History   Socioeconomic History   Marital status: Married    Spouse name: Not on file   Number of children: Not on file   Years of education: Not on file   Highest education level: Not on file  Occupational History   Not on file  Tobacco Use   Smoking status: Never   Smokeless tobacco: Never  Vaping Use   Vaping Use: Never used  Substance and Sexual Activity   Alcohol use: Not Currently   Drug use: Never   Sexual activity: Not on file  Other Topics Concern   Not on file  Social History Narrative   Not on file   Social Determinants of Health   Financial Resource Strain: Not on file  Food Insecurity: Not on file  Transportation Needs: Not on file  Physical Activity: Not on file  Stress: Not on file  Social Connections: Not on file  Intimate Partner Violence: Not on file    FAMILY HISTORY: No family history on file.  ALLERGIES:  is allergic to gazyva  [obinutuzumab].  MEDICATIONS:  Current Outpatient Medications  Medication Sig Dispense Refill   acyclovir (ZOVIRAX) 400 MG tablet Take 1 tablet (400 mg total) by mouth daily. (Patient not taking: Reported on 08/13/2019) 30 tablet 3   allopurinol (ZYLOPRIM) 100 MG tablet Take 1 tablet (100 mg total) by mouth 2 (two) times daily. 60 tablet 2   dexamethasone (DECADRON) 4 MG tablet Take 2 tablets (8 mg total) by mouth daily. Start the day after bendamustine chemotherapy for 2 days. Take with food. (Patient not taking: Reported on 08/13/2019) 30 tablet 1   fenofibrate micronized (LOFIBRA) 67 MG capsule Take 67 mg by mouth daily before breakfast.     lisinopril (PRINIVIL,ZESTRIL) 10 MG tablet Take 10 mg by mouth daily.     ondansetron (ZOFRAN-ODT) 4 MG disintegrating tablet 31m ODT q4 hours prn nausea/vomit 10 tablet 0   sertraline (ZOLOFT) 100 MG tablet Take 100 mg by mouth daily.     venetoclax (VENCLEXTA) 100 MG tablet Take 2 tablets (200 mg total) by mouth daily. Tablets should be swallowed whole with a meal and a full glass of water. 60 tablet 1   VITAMIN D PO Take 2,000 Units by mouth.     No current facility-administered medications for this visit.    REVIEW OF SYSTEMS:   10 Point review of Systems was done is negative except as noted above.  PHYSICAL EXAMINATION: .BP 107/74    Pulse 60  Temp 98.8 F (37.1 C)    Resp 20    Wt 277 lb 3.2 oz (125.7 kg)    SpO2 100%    BMI 42.15 kg/m  NAD GENERAL:alert, in no acute distress and comfortable SKIN: no acute rashes, no significant lesions EYES: conjunctiva are pink and non-injected, sclera anicteric OROPHARYNX: MMM, no exudates, no oropharyngeal erythema or ulceration NECK: supple, no JVD LYMPH:  no palpable lymphadenopathy in the cervical, axillary or inguinal regions LUNGS: clear to auscultation b/l with normal respiratory effort HEART: regular rate & rhythm ABDOMEN:  normoactive bowel sounds , non tender, not distended. Extremity: no  pedal edema PSYCH: alert & oriented x 3 with fluent speech NEURO: no focal motor/sensory deficits    LABORATORY DATA:  I have reviewed the data as listed  .Marland Kitchen Component     Latest Ref Rng & Units 08/04/2021  WBC     4.0 - 10.5 K/uL 4.4  RBC     4.22 - 5.81 MIL/uL 4.68  Hemoglobin     13.0 - 17.0 g/dL 14.6  HCT     39.0 - 52.0 % 41.4  MCV     80.0 - 100.0 fL 88.5  MCH     26.0 - 34.0 pg 31.2  MCHC     30.0 - 36.0 g/dL 35.3  RDW     11.5 - 15.5 % 13.2  Platelets     150 - 400 K/uL 186  nRBC     0.0 - 0.2 % 0.0  Neutrophils     % 44  NEUT#     1.7 - 7.7 K/uL 2.0  Lymphocytes     % 39  Lymphocyte #     0.7 - 4.0 K/uL 1.7  Monocytes Relative     % 11  Monocyte #     0.1 - 1.0 K/uL 0.5  Eosinophil     % 4  Eosinophils Absolute     0.0 - 0.5 K/uL 0.2  Basophil     % 1  Basophils Absolute     0.0 - 0.1 K/uL 0.0  Immature Granulocytes     % 1  Abs Immature Granulocytes     0.00 - 0.07 K/uL 0.04  Sodium     135 - 145 mmol/L 138  Potassium     3.5 - 5.1 mmol/L 4.2  Chloride     98 - 111 mmol/L 107  CO2     22 - 32 mmol/L 25  Glucose     70 - 99 mg/dL 118 (H)  BUN     6 - 20 mg/dL 17  Creatinine     0.61 - 1.24 mg/dL 0.86  Calcium     8.9 - 10.3 mg/dL 9.8  Total Protein     6.5 - 8.1 g/dL 6.7  Albumin     3.5 - 5.0 g/dL 4.7  AST     15 - 41 U/L 29  ALT     0 - 44 U/L 32  Alkaline Phosphatase     38 - 126 U/L 55  Total Bilirubin     0.3 - 1.2 mg/dL 0.8  GFR, Est Non African American     >60 mL/min >60  Anion gap     5 - 15 6  LDH     98 - 192 U/L 166    Component     Latest Ref Rng & Units 10/30/2017  HIV Screen 4th Generation wRfx     Non Reactive  Non Reactive  HCV Ab     0.0 - 0.9 s/co ratio <0.1  Hepatitis B Surface Ag     Negative Negative  Hep B Core Ab, Tot     Negative Negative      10/25/17 Tissue Flow Cytometry:    10/25/17 Needle/core Bx:   FISH Oncology  Order: [id number]  Status:  Final result   Visible to  patient:  No (Not Released) Next appt:  11/07/2017 at 09:00 AM in Radiology ([facility]) Dx:  Lymphocytosis  Component 61moago  Specimen Type Comment:   Comment: BLOOD  Cells Counted 200   Cells Analyzed 200   FISH Result Comment:   Comment: NORMAL:  NO CYCLIN D1 OR IGH GENE REARRANGEMENT OBSERVED  Interpretation Comment:   Comment: (NOTE)               nuc ish 11q13(CCND1x2),14q32(IGHx2)[200]       The fluorescence in situ hybridization (FISH) result  was normal. Dual color dual fusion DNA FISH probes targeting  the cyclin D1 gene (CCND1 or BCL1)([employer] and the  heavy chain immunoglobulin gene (IgH) at 14q32 showed two  hybridization signals each with no fusion in all cells  examined. Thus, there was NO apparent rearrangement of the  primary genes involved with mantle cell lymphoma, and to a  lesser extent myeloma.  .Marland Kitchen        RADIOGRAPHIC STUDIES: I have personally reviewed the radiological images as listed and agreed with the findings in the report. No results found. CT chest abdomen pelvis 03/26/2021: IMPRESSION: 1. Interval development of bulky lymphadenopathy in the chest, abdomen, and pelvis. Imaging features compatible with recurrent lymphoma. 2. No hepatosplenomegaly. 3. Enlargement of the pulmonary outflow tract and main pulmonary arteries suggests pulmonary arterial hypertension. 4. 2-3 mm left upper lobe pulmonary nodule was largely obscured on the prior study. Most likely benign, attention on follow-up recommended.     Electronically Signed   By: EMisty StanleyM.D.  CT neck 03/26/2021 IMPRESSION: 1. Limited direct comparison to prior PET-CT (no prior neck CTs) with possible slight increase in size of a few left submandibular nodes. Otherwise, similar increased number of lymph nodes throughout the neck without substantial change in multiple mildly enlarged nodes (detailed above). 2. Oval 2.1 x 1.3 cm mass in the posterior right nasal cavity, nonspecific  but potentially a polyp or retention cyst. Recommend correlation with direct inspection.     ASSESSMENT & PLAN:   41y.o. male with  1.  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 Noted 13q deletion: 13q14.3 is at 47.67% for normal nuclei, and 313.08%with mono allelic deletion of [id number]  -ATM for 11q22.3 revealed 81.67% with normal nuclei and 18.33% with positive nuclei for del 11q22.3   02/26/18 CT C/A/P revealed  Marked response to therapy as evidenced by decrease in size or resolution of adenopathy throughout chest, abdomen and pelvis. 2. Hepatic steatosis. Slight marginal irregularity suggests cirrhosis. 3. Enlarged pulmonary arteries, indicative of pulmonary arterial hypertension.   S/p 6 cycles of BR completed on 04/19/18 for first-line treatment  06/04/18 CT C/A/P revealed Stable CT chest without thoracic lymphadenopathy. Continued further decrease in abdominal and pelvic lymphadenopathy. No new or progressive lymphadenopathy on today's exam. 2. Hepatic steatosis. Subtle nodularity of liver contour raises the question of cirrhosis. 3. Pulmonary arterial enlargement. Pulmonary arterial hypertension a distinct concern.  2. H/o Rituxan infusion reaction-  -Had significant flushing  with rituxan needing steroids. Not a candidate in future for rapid infusion protocol.  3.  History of some rigors and flushing with Gazyva controlled with adding Demerol and adjusting premedications.  4. Sleep apnea Notes he had a sleep study since his last clinic visit and diagnosis was confirmed. -uses CPAP  5.  Recent history of influenza A about 7 to 10 days ago treated with Tamiflu.  Symptoms resolved.  He now has some mild persistent cough with increased secretions possible mild bronchitis -Symptoms resolved  6.  Mildly elevated transaminases less than 2 times upper limit of normal likely related to his recent influenza A and Tamiflu.  We  will monitor-now resolved PLAN: -Patient notes no notable toxicity from cycle 2 of Gazyva.  No infection issues. -Labs done today show normal hemoglobin, normal CBC and normal LDH levels. -Good p.o. intake .no other acute new focal symptoms. -Patient has started his venetoclax and has not had an issue with his first dose of venetoclax. -He is appropriate to proceed with cycle 3-day 1 of Gazyva today, -Orders for Dyann Kief were reviewed and signed and will continue the same premedications.   FOLLOW UP: -Please schedule for weekly labs x6 -MD visit in 1 week and 3 weeks with these labs for venetoclax toxicity check. Please schedule cycle 4 of Gazyva with labs and MD visit.   The total time spent in the appointment was 16mns*  All of the patient's questions were answered with apparent satisfaction. The patient knows to call the clinic with any problems, questions or concerns.   GSullivan LoneMD MS AAHIVMS SWoman[facility] HospitalCSt[facility] CampusHematology/Oncology Physician [facility] .*Total Encounter Time as defined by the Centers for Medicare and Medicaid Services includes, in addition to the face-to-face time of a patient visit (documented in the note above) non-face-to-face time: obtaining and reviewing outside history, ordering and reviewing medications, tests or procedures, care coordination (communications with other health care professionals or caregivers) and documentation in the medical record.

## 2021-08-11 ENCOUNTER — Inpatient Hospital Stay: Payer: No Typology Code available for payment source | Admitting: Hematology

## 2021-08-11 ENCOUNTER — Other Ambulatory Visit: Payer: Self-pay

## 2021-08-11 ENCOUNTER — Inpatient Hospital Stay: Payer: No Typology Code available for payment source

## 2021-08-11 VITALS — BP 104/67 | HR 91 | Temp 98.1°F | Resp 20 | Wt 279.6 lb

## 2021-08-11 DIAGNOSIS — Z5111 Encounter for antineoplastic chemotherapy: Secondary | ICD-10-CM | POA: Diagnosis not present

## 2021-08-11 DIAGNOSIS — C9110 Chronic lymphocytic leukemia of B-cell type not having achieved remission: Secondary | ICD-10-CM

## 2021-08-11 LAB — CBC WITH DIFFERENTIAL (CANCER CENTER ONLY)
Abs Immature Granulocytes: 0.04 10*3/uL (ref 0.00–0.07)
Basophils Absolute: 0 10*3/uL (ref 0.0–0.1)
Basophils Relative: 1 %
Eosinophils Absolute: 0.2 10*3/uL (ref 0.0–0.5)
Eosinophils Relative: 4 %
HCT: 40.6 % (ref 39.0–52.0)
Hemoglobin: 14.3 g/dL (ref 13.0–17.0)
Immature Granulocytes: 1 %
Lymphocytes Relative: 38 %
Lymphs Abs: 1.6 10*3/uL (ref 0.7–4.0)
MCH: 31 pg (ref 26.0–34.0)
MCHC: 35.2 g/dL (ref 30.0–36.0)
MCV: 88.1 fL (ref 80.0–100.0)
Monocytes Absolute: 0.7 10*3/uL (ref 0.1–1.0)
Monocytes Relative: 16 %
Neutro Abs: 1.7 10*3/uL (ref 1.7–7.7)
Neutrophils Relative %: 40 %
Platelet Count: 189 10*3/uL (ref 150–400)
RBC: 4.61 MIL/uL (ref 4.22–5.81)
RDW: 13.1 % (ref 11.5–15.5)
WBC Count: 4.2 10*3/uL (ref 4.0–10.5)
nRBC: 0 % (ref 0.0–0.2)

## 2021-08-11 LAB — CMP (CANCER CENTER ONLY)
ALT: 26 U/L (ref 0–44)
AST: 24 U/L (ref 15–41)
Albumin: 4.6 g/dL (ref 3.5–5.0)
Alkaline Phosphatase: 57 U/L (ref 38–126)
Anion gap: 7 (ref 5–15)
BUN: 15 mg/dL (ref 6–20)
CO2: 25 mmol/L (ref 22–32)
Calcium: 9.5 mg/dL (ref 8.9–10.3)
Chloride: 106 mmol/L (ref 98–111)
Creatinine: 0.8 mg/dL (ref 0.61–1.24)
GFR, Estimated: >60 mL/min (ref >60)
Glucose, Bld: 102 mg/dL — ABNORMAL HIGH (ref 70–99)
Potassium: 4.4 mmol/L (ref 3.5–5.1)
Sodium: 138 mmol/L (ref 135–145)
Total Bilirubin: 0.6 mg/dL (ref 0.3–1.2)
Total Protein: 6.6 g/dL (ref 6.5–8.1)

## 2021-08-11 LAB — LACTATE DEHYDROGENASE: LDH: 159 U/L (ref 98–192)

## 2021-08-16 ENCOUNTER — Other Ambulatory Visit (HOSPITAL_COMMUNITY): Payer: Self-pay

## 2021-08-17 ENCOUNTER — Other Ambulatory Visit: Payer: Self-pay | Admitting: Hematology

## 2021-08-17 ENCOUNTER — Encounter: Payer: Self-pay | Admitting: Hematology

## 2021-08-17 ENCOUNTER — Other Ambulatory Visit: Payer: Self-pay

## 2021-08-17 ENCOUNTER — Other Ambulatory Visit (HOSPITAL_COMMUNITY): Payer: Self-pay

## 2021-08-17 DIAGNOSIS — C9110 Chronic lymphocytic leukemia of B-cell type not having achieved remission: Secondary | ICD-10-CM

## 2021-08-17 NOTE — Progress Notes (Addendum)
HEMATOLOGY/ONCOLOGY CLINIC NOTE  Date of Service: .08/11/2021   Patient Care Team: [name], MD as PCP - General (Family Medicine)  CHIEF COMPLAINTS/PURPOSE OF CONSULTATION:  Continue evaluation and management of CLL and toxicity check for venetoclax  HISTORY OF PRESENTING ILLNESS:  See previous note for details on initial presentation.  Interval History:  Patient is here for continued valuation and management of his CLL and toxicity check after having started venetoclax. He notes no issues with tolerating venetoclax at 50 mg p.o. daily at this time.  No diarrhea.  No nausea no vomiting.  No skin rashes. He is tolerating this without any acute concerns at this time.  Labs done today 08/11/2021 CBC within normal limits, LDH within normal limits at 159 and CMP within normal limits.  No other acute new symptoms.  MEDICAL HISTORY:  Past Medical History:  Diagnosis Date   High cholesterol    Hypertension    Leukemia ([place])     SURGICAL HISTORY: No past surgical history on file.  SOCIAL HISTORY: Social History   Socioeconomic History   Marital status: Married    Spouse name: Not on file   Number of children: Not on file   Years of education: Not on file   Highest education level: Not on file  Occupational History   Not on file  Tobacco Use   Smoking status: Never   Smokeless tobacco: Never  Vaping Use   Vaping Use: Never used  Substance and Sexual Activity   Alcohol use: Not Currently   Drug use: Never   Sexual activity: Not on file  Other Topics Concern   Not on file  Social History Narrative   Not on file   Social Determinants of Health   Financial Resource Strain: Not on file  Food Insecurity: Not on file  Transportation Needs: Not on file  Physical Activity: Not on file  Stress: Not on file  Social Connections: Not on file  Intimate Partner Violence: Not on file    FAMILY HISTORY: No family history on file.  ALLERGIES:  is allergic  to gazyva [obinutuzumab].  MEDICATIONS:  Current Outpatient Medications  Medication Sig Dispense Refill   acyclovir (ZOVIRAX) 400 MG tablet Take 1 tablet (400 mg total) by mouth daily. (Patient not taking: Reported on 08/13/2019) 30 tablet 3   allopurinol (ZYLOPRIM) 100 MG tablet Take 1 tablet (100 mg total) by mouth 2 (two) times daily. 60 tablet 2   dexamethasone (DECADRON) 4 MG tablet Take 2 tablets (8 mg total) by mouth daily. Start the day after bendamustine chemotherapy for 2 days. Take with food. (Patient not taking: Reported on 08/13/2019) 30 tablet 1   fenofibrate micronized (LOFIBRA) 67 MG capsule Take 67 mg by mouth daily before breakfast.     lisinopril (PRINIVIL,ZESTRIL) 10 MG tablet Take 10 mg by mouth daily.     ondansetron (ZOFRAN-ODT) 4 MG disintegrating tablet 37m ODT q4 hours prn nausea/vomit 10 tablet 0   sertraline (ZOLOFT) 100 MG tablet Take 100 mg by mouth daily.     venetoclax (VENCLEXTA) 100 MG tablet Take 2 tablets (200 mg total) by mouth daily. Tablets should be swallowed whole with a meal and a full glass of water. 60 tablet 1   VITAMIN D PO Take 2,000 Units by mouth.     No current facility-administered medications for this visit.    REVIEW OF SYSTEMS:   10 Point review of Systems was done is negative except as noted above.  PHYSICAL  EXAMINATION: .BP 104/67    Pulse 91    Temp 98.1 F (36.7 C)    Resp 20    Wt 279 lb 9.6 oz (126.8 kg)    SpO2 98%    BMI 42.51 kg/m  NAD GENERAL:alert, in no acute distress and comfortable SKIN: no acute rashes, no significant lesions EYES: conjunctiva are pink and non-injected, sclera anicteric OROPHARYNX: MMM, no exudates, no oropharyngeal erythema or ulceration NECK: supple, no JVD LYMPH:  no palpable lymphadenopathy in the cervical, axillary or inguinal regions LUNGS: clear to auscultation b/l with normal respiratory effort HEART: regular rate & rhythm ABDOMEN:  normoactive bowel sounds , non tender, not  distended. Extremity: no pedal edema PSYCH: alert & oriented x 3 with fluent speech NEURO: no focal motor/sensory deficits     LABORATORY DATA:  I have reviewed the data as listed Component     Latest Ref Rng & Units 08/11/2021  WBC     4.0 - 10.5 K/uL 4.2  RBC     4.22 - 5.81 MIL/uL 4.61  Hemoglobin     13.0 - 17.0 g/dL 14.3  HCT     39.0 - 52.0 % 40.6  MCV     80.0 - 100.0 fL 88.1  MCH     26.0 - 34.0 pg 31.0  MCHC     30.0 - 36.0 g/dL 35.2  RDW     11.5 - 15.5 % 13.1  Platelets     150 - 400 K/uL 189  nRBC     0.0 - 0.2 % 0.0  Neutrophils     % 40  NEUT#     1.7 - 7.7 K/uL 1.7  Lymphocytes     % 38  Lymphocyte #     0.7 - 4.0 K/uL 1.6  Monocytes Relative     % 16  Monocyte #     0.1 - 1.0 K/uL 0.7  Eosinophil     % 4  Eosinophils Absolute     0.0 - 0.5 K/uL 0.2  Basophil     % 1  Basophils Absolute     0.0 - 0.1 K/uL 0.0  Immature Granulocytes     % 1  Abs Immature Granulocytes     0.00 - 0.07 K/uL 0.04  Sodium     135 - 145 mmol/L 138  Potassium     3.5 - 5.1 mmol/L 4.4  Chloride     98 - 111 mmol/L 106  CO2     22 - 32 mmol/L 25  Glucose     70 - 99 mg/dL 102 (H)  BUN     6 - 20 mg/dL 15  Creatinine     0.61 - 1.24 mg/dL 0.80  Calcium     8.9 - 10.3 mg/dL 9.5  Total Protein     6.5 - 8.1 g/dL 6.6  Albumin     3.5 - 5.0 g/dL 4.6  AST     15 - 41 U/L 24  ALT     0 - 44 U/L 26  Alkaline Phosphatase     38 - 126 U/L 57  Total Bilirubin     0.3 - 1.2 mg/dL 0.6  GFR, Est Non African American     >60 mL/min >60  Anion gap     5 - 15 7  LDH     98 - 192 U/L 159     Component     Latest Ref Rng & Units 10/30/2017  HIV Screen 4th Generation wRfx     Non Reactive Non Reactive  HCV Ab     0.0 - 0.9 s/co ratio <0.1  Hepatitis B Surface Ag     Negative Negative  Hep B Core Ab, Tot     Negative Negative      10/25/17 Tissue Flow Cytometry:    10/25/17 Needle/core Bx:   FISH Oncology  Order: [id number]  Status:  Final  result   Visible to patient:  No (Not Released) Next appt:  11/07/2017 at 09:00 AM in Radiology ([facility]) Dx:  Lymphocytosis  Component 63moago  Specimen Type Comment:   Comment: BLOOD  Cells Counted 200   Cells Analyzed 200   FISH Result Comment:   Comment: NORMAL:  NO CYCLIN D1 OR IGH GENE REARRANGEMENT OBSERVED  Interpretation Comment:   Comment: (NOTE)               nuc ish 11q13(CCND1x2),14q32(IGHx2)[200]       The fluorescence in situ hybridization (FISH) result  was normal. Dual color dual fusion DNA FISH probes targeting  the cyclin D1 gene (CCND1 or BCL1)([employer] and the  heavy chain immunoglobulin gene (IgH) at 14q32 showed two  hybridization signals each with no fusion in all cells  examined. Thus, there was NO apparent rearrangement of the  primary genes involved with mantle cell lymphoma, and to a  lesser extent myeloma.  .Marland Kitchen        RADIOGRAPHIC STUDIES: I have personally reviewed the radiological images as listed and agreed with the findings in the report. No results found. CT chest abdomen pelvis 03/26/2021: IMPRESSION: 1. Interval development of bulky lymphadenopathy in the chest, abdomen, and pelvis. Imaging features compatible with recurrent lymphoma. 2. No hepatosplenomegaly. 3. Enlargement of the pulmonary outflow tract and main pulmonary arteries suggests pulmonary arterial hypertension. 4. 2-3 mm left upper lobe pulmonary nodule was largely obscured on the prior study. Most likely benign, attention on follow-up recommended.     Electronically Signed   By: EMisty StanleyM.D.  CT neck 03/26/2021 IMPRESSION: 1. Limited direct comparison to prior PET-CT (no prior neck CTs) with possible slight increase in size of a few left submandibular nodes. Otherwise, similar increased number of lymph nodes throughout the neck without substantial change in multiple mildly enlarged nodes (detailed above). 2. Oval 2.1 x 1.3 cm mass in the posterior right nasal  cavity, nonspecific but potentially a polyp or retention cyst. Recommend correlation with direct inspection.     ASSESSMENT & PLAN:   41y.o. male with  1.  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 Noted 13q deletion: 13q14.3 is at 47.67% for normal nuclei, and 342.68%with mono allelic deletion of [id number]  -ATM for 11q22.3 revealed 81.67% with normal nuclei and 18.33% with positive nuclei for del 11q22.3   02/26/18 CT C/A/P revealed  Marked response to therapy as evidenced by decrease in size or resolution of adenopathy throughout chest, abdomen and pelvis. 2. Hepatic steatosis. Slight marginal irregularity suggests cirrhosis. 3. Enlarged pulmonary arteries, indicative of pulmonary arterial hypertension.   S/p 6 cycles of BR completed on 04/19/18 for first-line treatment  06/04/18 CT C/A/P revealed Stable CT chest without thoracic lymphadenopathy. Continued further decrease in abdominal and pelvic lymphadenopathy. No new or progressive lymphadenopathy on today's exam. 2. Hepatic steatosis. Subtle nodularity of liver contour raises the question of cirrhosis. 3. Pulmonary arterial enlargement. Pulmonary arterial hypertension a distinct  concern.  2. H/o Rituxan infusion reaction-  -Had significant flushing with rituxan needing steroids. Not a candidate in future for rapid infusion protocol.  3.  History of some rigors and flushing with Gazyva controlled with adding Demerol and adjusting premedications.  4. Sleep apnea Notes he had a sleep study since his last clinic visit and diagnosis was confirmed. -uses CPAP  5.  Recent history of influenza A about 7 to 10 days ago treated with Tamiflu.  Symptoms resolved.  He now has some mild persistent cough with increased secretions possible mild bronchitis -Symptoms resolved  6.  Mildly elevated transaminases less than 2 times upper limit of normal likely related to his recent  influenza A and Tamiflu.  We will monitor-now resolved PLAN: -Patient reports no toxicities from cycle 3 of Gazyva. -No toxicities with venetoclax at this time at 50 mg p.o. daily. -CBC CMP and LDH within normal limits. -Continue venetoclax dose escalation as per protocol up to 200 mg p.o. daily. -Good p.o. intake .no other acute new focal symptoms.   FOLLOW UP: Continue follow-up as per currently scheduled appointments for venetoclax monitoring and next dose of Starling Manns MD [place] AAHIVMS [facility] Hematology/Oncology Physician [facility]

## 2021-08-18 ENCOUNTER — Other Ambulatory Visit: Payer: Self-pay

## 2021-08-18 ENCOUNTER — Inpatient Hospital Stay: Payer: No Typology Code available for payment source

## 2021-08-18 DIAGNOSIS — Z5111 Encounter for antineoplastic chemotherapy: Secondary | ICD-10-CM | POA: Diagnosis not present

## 2021-08-18 DIAGNOSIS — C9110 Chronic lymphocytic leukemia of B-cell type not having achieved remission: Secondary | ICD-10-CM

## 2021-08-18 LAB — CBC WITH DIFFERENTIAL (CANCER CENTER ONLY)
Abs Immature Granulocytes: 0.02 10*3/uL (ref 0.00–0.07)
Basophils Absolute: 0 10*3/uL (ref 0.0–0.1)
Basophils Relative: 1 %
Eosinophils Absolute: 0.2 10*3/uL (ref 0.0–0.5)
Eosinophils Relative: 4 %
HCT: 42.1 % (ref 39.0–52.0)
Hemoglobin: 14.5 g/dL (ref 13.0–17.0)
Immature Granulocytes: 1 %
Lymphocytes Relative: 34 %
Lymphs Abs: 1.4 10*3/uL (ref 0.7–4.0)
MCH: 30.7 pg (ref 26.0–34.0)
MCHC: 34.4 g/dL (ref 30.0–36.0)
MCV: 89 fL (ref 80.0–100.0)
Monocytes Absolute: 0.6 10*3/uL (ref 0.1–1.0)
Monocytes Relative: 15 %
Neutro Abs: 1.9 10*3/uL (ref 1.7–7.7)
Neutrophils Relative %: 45 %
Platelet Count: 202 10*3/uL (ref 150–400)
RBC: 4.73 MIL/uL (ref 4.22–5.81)
RDW: 12.8 % (ref 11.5–15.5)
WBC Count: 4.2 10*3/uL (ref 4.0–10.5)
nRBC: 0 % (ref 0.0–0.2)

## 2021-08-18 LAB — CMP (CANCER CENTER ONLY)
ALT: 33 U/L (ref 0–44)
AST: 31 U/L (ref 15–41)
Albumin: 4.8 g/dL (ref 3.5–5.0)
Alkaline Phosphatase: 47 U/L (ref 38–126)
Anion gap: 6 (ref 5–15)
BUN: 13 mg/dL (ref 6–20)
CO2: 24 mmol/L (ref 22–32)
Calcium: 9.4 mg/dL (ref 8.9–10.3)
Chloride: 107 mmol/L (ref 98–111)
Creatinine: 0.76 mg/dL (ref 0.61–1.24)
GFR, Estimated: >60 mL/min (ref >60)
Glucose, Bld: 100 mg/dL — ABNORMAL HIGH (ref 70–99)
Potassium: 4.1 mmol/L (ref 3.5–5.1)
Sodium: 137 mmol/L (ref 135–145)
Total Bilirubin: 0.8 mg/dL (ref 0.3–1.2)
Total Protein: 6.7 g/dL (ref 6.5–8.1)

## 2021-08-18 LAB — LACTATE DEHYDROGENASE: LDH: 234 U/L — ABNORMAL HIGH (ref 98–192)

## 2021-08-20 ENCOUNTER — Other Ambulatory Visit (HOSPITAL_COMMUNITY): Payer: Self-pay

## 2021-08-20 ENCOUNTER — Encounter: Payer: Self-pay | Admitting: Hematology

## 2021-08-20 ENCOUNTER — Other Ambulatory Visit: Payer: Self-pay | Admitting: Hematology

## 2021-08-20 MED ORDER — VENETOCLAX 100 MG PO TABS
200.0000 mg | ORAL_TABLET | Freq: Every day | ORAL | 1 refills | Status: DC
  Filled 2021-08-20 (×2): qty 60, 30d supply, fill #0
  Filled 2021-09-13: qty 60, 30d supply, fill #1

## 2021-08-23 ENCOUNTER — Other Ambulatory Visit (HOSPITAL_COMMUNITY): Payer: Self-pay

## 2021-08-25 ENCOUNTER — Inpatient Hospital Stay: Payer: No Typology Code available for payment source

## 2021-08-25 ENCOUNTER — Other Ambulatory Visit: Payer: Self-pay

## 2021-08-25 ENCOUNTER — Inpatient Hospital Stay: Payer: No Typology Code available for payment source | Admitting: Hematology

## 2021-08-25 ENCOUNTER — Other Ambulatory Visit (HOSPITAL_COMMUNITY): Payer: Self-pay

## 2021-08-25 VITALS — BP 121/66 | HR 99 | Temp 98.2°F | Resp 17 | Wt 285.6 lb

## 2021-08-25 DIAGNOSIS — C9110 Chronic lymphocytic leukemia of B-cell type not having achieved remission: Secondary | ICD-10-CM | POA: Diagnosis not present

## 2021-08-25 DIAGNOSIS — Z5111 Encounter for antineoplastic chemotherapy: Secondary | ICD-10-CM | POA: Diagnosis not present

## 2021-08-25 LAB — CBC WITH DIFFERENTIAL (CANCER CENTER ONLY)
Abs Immature Granulocytes: 0.02 10*3/uL (ref 0.00–0.07)
Basophils Absolute: 0 10*3/uL (ref 0.0–0.1)
Basophils Relative: 1 %
Eosinophils Absolute: 0.1 10*3/uL (ref 0.0–0.5)
Eosinophils Relative: 4 %
HCT: 40.7 % (ref 39.0–52.0)
Hemoglobin: 14.3 g/dL (ref 13.0–17.0)
Immature Granulocytes: 1 %
Lymphocytes Relative: 40 %
Lymphs Abs: 1.3 10*3/uL (ref 0.7–4.0)
MCH: 31 pg (ref 26.0–34.0)
MCHC: 35.1 g/dL (ref 30.0–36.0)
MCV: 88.3 fL (ref 80.0–100.0)
Monocytes Absolute: 0.6 10*3/uL (ref 0.1–1.0)
Monocytes Relative: 19 %
Neutro Abs: 1.2 10*3/uL — ABNORMAL LOW (ref 1.7–7.7)
Neutrophils Relative %: 35 %
Platelet Count: 206 10*3/uL (ref 150–400)
RBC: 4.61 MIL/uL (ref 4.22–5.81)
RDW: 13 % (ref 11.5–15.5)
WBC Count: 3.3 10*3/uL — ABNORMAL LOW (ref 4.0–10.5)
nRBC: 0 % (ref 0.0–0.2)

## 2021-08-25 LAB — CMP (CANCER CENTER ONLY)
ALT: 26 U/L (ref 0–44)
AST: 25 U/L (ref 15–41)
Albumin: 4.5 g/dL (ref 3.5–5.0)
Alkaline Phosphatase: 58 U/L (ref 38–126)
Anion gap: 4 — ABNORMAL LOW (ref 5–15)
BUN: 12 mg/dL (ref 6–20)
CO2: 30 mmol/L (ref 22–32)
Calcium: 9.5 mg/dL (ref 8.9–10.3)
Chloride: 106 mmol/L (ref 98–111)
Creatinine: 0.83 mg/dL (ref 0.61–1.24)
GFR, Estimated: >60 mL/min (ref >60)
Glucose, Bld: 97 mg/dL (ref 70–99)
Potassium: 4.2 mmol/L (ref 3.5–5.1)
Sodium: 140 mmol/L (ref 135–145)
Total Bilirubin: 0.6 mg/dL (ref 0.3–1.2)
Total Protein: 6.3 g/dL — ABNORMAL LOW (ref 6.5–8.1)

## 2021-08-25 LAB — URIC ACID: Uric Acid, Serum: 3.8 mg/dL (ref 3.7–8.6)

## 2021-08-25 LAB — LACTATE DEHYDROGENASE: LDH: 150 U/L (ref 98–192)

## 2021-08-31 MED FILL — Dexamethasone Sodium Phosphate Inj 100 MG/10ML: INTRAMUSCULAR | Qty: 2 | Status: AC

## 2021-08-31 NOTE — Progress Notes (Signed)
HEMATOLOGY/ONCOLOGY CLINIC NOTE  Date of Service: 08/25/2021    Patient Care Team: [name], MD as PCP - General (Family Medicine)  CHIEF COMPLAINTS/PURPOSE OF CONSULTATION:  Continue evaluation and management of CLL and toxicity check for venetoclax  HISTORY OF PRESENTING ILLNESS:  See previous note for details on initial presentation.  Interval History:  Patient is here for continued follow-up of his CLL and to monitor escalating doses of venetoclax.  He notes no new toxicities.  No fevers no chills no night sweats.  No diarrhea.  No new skin rashes. Currently on venetoclax 100 mg p.o. daily. Labs reviewed with the patient today.  MEDICAL HISTORY:  Past Medical History:  Diagnosis Date   High cholesterol    Hypertension    Leukemia ([place])     SURGICAL HISTORY: No past surgical history on file.  SOCIAL HISTORY: Social History   Socioeconomic History   Marital status: Married    Spouse name: Not on file   Number of children: Not on file   Years of education: Not on file   Highest education level: Not on file  Occupational History   Not on file  Tobacco Use   Smoking status: Never   Smokeless tobacco: Never  Vaping Use   Vaping Use: Never used  Substance and Sexual Activity   Alcohol use: Not Currently   Drug use: Never   Sexual activity: Not on file  Other Topics Concern   Not on file  Social History Narrative   Not on file   Social Determinants of Health   Financial Resource Strain: Not on file  Food Insecurity: Not on file  Transportation Needs: Not on file  Physical Activity: Not on file  Stress: Not on file  Social Connections: Not on file  Intimate Partner Violence: Not on file    FAMILY HISTORY: No family history on file.  ALLERGIES:  is allergic to gazyva [obinutuzumab].  MEDICATIONS:  Current Outpatient Medications  Medication Sig Dispense Refill   acyclovir (ZOVIRAX) 400 MG tablet Take 1 tablet (400 mg total) by mouth  daily. (Patient not taking: Reported on 08/13/2019) 30 tablet 3   allopurinol (ZYLOPRIM) 100 MG tablet Take 1 tablet (100 mg total) by mouth 2 (two) times daily. 60 tablet 2   dexamethasone (DECADRON) 4 MG tablet Take 2 tablets (8 mg total) by mouth daily. Start the day after bendamustine chemotherapy for 2 days. Take with food. (Patient not taking: Reported on 08/13/2019) 30 tablet 1   fenofibrate micronized (LOFIBRA) 67 MG capsule Take 67 mg by mouth daily before breakfast.     lisinopril (PRINIVIL,ZESTRIL) 10 MG tablet Take 10 mg by mouth daily.     ondansetron (ZOFRAN-ODT) 4 MG disintegrating tablet 18m ODT q4 hours prn nausea/vomit 10 tablet 0   sertraline (ZOLOFT) 100 MG tablet Take 100 mg by mouth daily.     venetoclax (VENCLEXTA) 100 MG tablet Take 2 tablets (200 mg total) by mouth daily. Tablets should be swallowed whole with a meal and a full glass of water. 60 tablet 1   VITAMIN D PO Take 2,000 Units by mouth.     No current facility-administered medications for this visit.    REVIEW OF SYSTEMS:   10 Point review of Systems was done is negative except as noted above.  PHYSICAL EXAMINATION: .BP 121/66 (BP Location: Left Arm, Patient Position: Sitting)    Pulse 99    Temp 98.2 F (36.8 C) (Temporal)    Resp 17  Wt 285 lb 9.6 oz (129.5 kg)    SpO2 95%    BMI 43.43 kg/m  NAD GENERAL:alert, in no acute distress and comfortable SKIN: no acute rashes, no significant lesions EYES: conjunctiva are pink and non-injected, sclera anicteric OROPHARYNX: MMM, no exudates, no oropharyngeal erythema or ulceration NECK: supple, no JVD LYMPH:  no palpable lymphadenopathy in the cervical, axillary or inguinal regions LUNGS: clear to auscultation b/l with normal respiratory effort HEART: regular rate & rhythm ABDOMEN:  normoactive bowel sounds , non tender, not distended. Extremity: no pedal edema PSYCH: alert & oriented x 3 with fluent speech NEURO: no focal motor/sensory  deficits    LABORATORY DATA:   . CBC Latest Ref Rng & Units 08/25/2021 08/18/2021 08/11/2021  WBC 4.0 - 10.5 K/uL 3.3(L) 4.2 4.2  Hemoglobin 13.0 - 17.0 g/dL 14.3 14.5 14.3  Hematocrit 39.0 - 52.0 % 40.7 42.1 40.6  Platelets 150 - 400 K/uL 206 202 189   CMP Latest Ref Rng & Units 08/25/2021 08/18/2021 08/11/2021  Glucose 70 - 99 mg/dL 97 100(H) 102(H)  BUN 6 - 20 mg/dL '12 13 15  ' Creatinine 0.61 - 1.24 mg/dL 0.83 0.76 0.80  Sodium 135 - 145 mmol/L 140 137 138  Potassium 3.5 - 5.1 mmol/L 4.2 4.1 4.4  Chloride 98 - 111 mmol/L 106 107 106  CO2 22 - 32 mmol/L '30 24 25  ' Calcium 8.9 - 10.3 mg/dL 9.5 9.4 9.5  Total Protein 6.5 - 8.1 g/dL 6.3(L) 6.7 6.6  Total Bilirubin 0.3 - 1.2 mg/dL 0.6 0.8 0.6  Alkaline Phos 38 - 126 U/L 58 47 57  AST 15 - 41 U/L '25 31 24  ' ALT 0 - 44 U/L 26 33 26   . Lab Results  Component Value Date   LDH 150 08/25/2021   Uric acid 3.8    Component     Latest Ref Rng & Units 10/30/2017  HIV Screen 4th Generation wRfx     Non Reactive Non Reactive  HCV Ab     0.0 - 0.9 s/co ratio <0.1  Hepatitis B Surface Ag     Negative Negative  Hep B Core Ab, Tot     Negative Negative      10/25/17 Tissue Flow Cytometry:    10/25/17 Needle/core Bx:   FISH Oncology  Order: [id number]  Status:  Final result   Visible to patient:  No (Not Released) Next appt:  11/07/2017 at 09:00 AM in Radiology ([facility]) Dx:  Lymphocytosis  Component 21moago  Specimen Type Comment:   Comment: BLOOD  Cells Counted 200   Cells Analyzed 200   FISH Result Comment:   Comment: NORMAL:  NO CYCLIN D1 OR IGH GENE REARRANGEMENT OBSERVED  Interpretation Comment:   Comment: (NOTE)               nuc ish 11q13(CCND1x2),14q32(IGHx2)[200]       The fluorescence in situ hybridization (FISH) result  was normal. Dual color dual fusion DNA FISH probes targeting  the cyclin D1 gene (CCND1 or BCL1)([employer] and the  heavy chain immunoglobulin gene (IgH) at 14q32 showed two  hybridization  signals each with no fusion in all cells  examined. Thus, there was NO apparent rearrangement of the  primary genes involved with mantle cell lymphoma, and to a  lesser extent myeloma.  .Marland Kitchen        RADIOGRAPHIC STUDIES: I have personally reviewed the radiological images as listed and agreed with the findings in the report.  No results found. CT chest abdomen pelvis 03/26/2021: IMPRESSION: 1. Interval development of bulky lymphadenopathy in the chest, abdomen, and pelvis. Imaging features compatible with recurrent lymphoma. 2. No hepatosplenomegaly. 3. Enlargement of the pulmonary outflow tract and main pulmonary arteries suggests pulmonary arterial hypertension. 4. 2-3 mm left upper lobe pulmonary nodule was largely obscured on the prior study. Most likely benign, attention on follow-up recommended.     Electronically Signed   By: Misty Stanley M.D.  CT neck 03/26/2021 IMPRESSION: 1. Limited direct comparison to prior PET-CT (no prior neck CTs) with possible slight increase in size of a few left submandibular nodes. Otherwise, similar increased number of lymph nodes throughout the neck without substantial change in multiple mildly enlarged nodes (detailed above). 2. Oval 2.1 x 1.3 cm mass in the posterior right nasal cavity, nonspecific but potentially a polyp or retention cyst. Recommend correlation with direct inspection.     ASSESSMENT & PLAN:   41 y.o. male with  1.  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 Noted 13q deletion: 13q14.3 is at 47.67% for normal nuclei, and 15.17% with mono allelic deletion of [id number].  -ATM for 11q22.3 revealed 81.67% with normal nuclei and 18.33% with positive nuclei for del 11q22.3   02/26/18 CT C/A/P revealed  Marked response to therapy as evidenced by decrease in size or resolution of adenopathy throughout chest, abdomen and pelvis. 2. Hepatic steatosis. Slight marginal  irregularity suggests cirrhosis. 3. Enlarged pulmonary arteries, indicative of pulmonary arterial hypertension.   S/p 6 cycles of BR completed on 04/19/18 for first-line treatment  06/04/18 CT C/A/P revealed Stable CT chest without thoracic lymphadenopathy. Continued further decrease in abdominal and pelvic lymphadenopathy. No new or progressive lymphadenopathy on today's exam. 2. Hepatic steatosis. Subtle nodularity of liver contour raises the question of cirrhosis. 3. Pulmonary arterial enlargement. Pulmonary arterial hypertension a distinct concern.  2. H/o Rituxan infusion reaction-  -Had significant flushing with rituxan needing steroids. Not a candidate in future for rapid infusion protocol.  3.  History of some rigors and flushing with Gazyva controlled with adding Demerol and adjusting premedications.  4. Sleep apnea Notes he had a sleep study since his last clinic visit and diagnosis was confirmed. -uses CPAP  5.  Recent history of influenza A about 7 to 10 days ago treated with Tamiflu.  Symptoms resolved.  He now has some mild persistent cough with increased secretions possible mild bronchitis -Symptoms resolved  6.  Mildly elevated transaminases less than 2 times upper limit of normal likely related to his recent influenza A and Tamiflu.  We will monitor-now resolved PLAN: -Patient notes no new toxicities with venetoclax at 100 mg p.o. daily. -Labs reviewed CBC CMP stable uric acid within normal limits.  No evidence of uncontrolled tumor lysis syndrome with venetoclax. -Patient recommended to continue drinking at least 2 L of water daily. -He has no primary toxicities from his current treatment and will continue dose escalation of his venetoclax to 200 mg p.o. daily  FOLLOW UP: As per scheduled appointments.  Sullivan Lone MD [place] AAHIVMS [facility] Hematology/Oncology Physician [facility]

## 2021-09-01 ENCOUNTER — Inpatient Hospital Stay: Payer: No Typology Code available for payment source

## 2021-09-01 ENCOUNTER — Encounter: Payer: Self-pay | Admitting: Hematology

## 2021-09-01 ENCOUNTER — Other Ambulatory Visit: Payer: Self-pay

## 2021-09-01 ENCOUNTER — Inpatient Hospital Stay: Payer: No Typology Code available for payment source | Admitting: Hematology

## 2021-09-01 ENCOUNTER — Inpatient Hospital Stay: Payer: No Typology Code available for payment source | Attending: Hematology

## 2021-09-01 VITALS — BP 115/66 | HR 98 | Temp 98.5°F | Resp 18

## 2021-09-01 VITALS — BP 107/70 | HR 99 | Temp 97.5°F | Resp 18 | Wt 279.7 lb

## 2021-09-01 DIAGNOSIS — C9110 Chronic lymphocytic leukemia of B-cell type not having achieved remission: Secondary | ICD-10-CM | POA: Insufficient documentation

## 2021-09-01 DIAGNOSIS — C8308 Small cell B-cell lymphoma, lymph nodes of multiple sites: Secondary | ICD-10-CM

## 2021-09-01 DIAGNOSIS — I10 Essential (primary) hypertension: Secondary | ICD-10-CM | POA: Insufficient documentation

## 2021-09-01 DIAGNOSIS — R7989 Other specified abnormal findings of blood chemistry: Secondary | ICD-10-CM | POA: Diagnosis not present

## 2021-09-01 DIAGNOSIS — Z806 Family history of leukemia: Secondary | ICD-10-CM | POA: Diagnosis not present

## 2021-09-01 DIAGNOSIS — Z7189 Other specified counseling: Secondary | ICD-10-CM

## 2021-09-01 DIAGNOSIS — Z79899 Other long term (current) drug therapy: Secondary | ICD-10-CM | POA: Diagnosis not present

## 2021-09-01 DIAGNOSIS — E7800 Pure hypercholesterolemia, unspecified: Secondary | ICD-10-CM | POA: Diagnosis not present

## 2021-09-01 DIAGNOSIS — R7402 Elevation of levels of lactic acid dehydrogenase (LDH): Secondary | ICD-10-CM | POA: Diagnosis not present

## 2021-09-01 DIAGNOSIS — Z5112 Encounter for antineoplastic immunotherapy: Secondary | ICD-10-CM | POA: Diagnosis present

## 2021-09-01 LAB — CBC WITH DIFFERENTIAL (CANCER CENTER ONLY)
Abs Immature Granulocytes: 0.02 10*3/uL (ref 0.00–0.07)
Basophils Absolute: 0 10*3/uL (ref 0.0–0.1)
Basophils Relative: 1 %
Eosinophils Absolute: 0.1 10*3/uL (ref 0.0–0.5)
Eosinophils Relative: 3 %
HCT: 42.4 % (ref 39.0–52.0)
Hemoglobin: 15.2 g/dL (ref 13.0–17.0)
Immature Granulocytes: 1 %
Lymphocytes Relative: 38 %
Lymphs Abs: 1.4 10*3/uL (ref 0.7–4.0)
MCH: 31.5 pg (ref 26.0–34.0)
MCHC: 35.8 g/dL (ref 30.0–36.0)
MCV: 87.8 fL (ref 80.0–100.0)
Monocytes Absolute: 0.7 10*3/uL (ref 0.1–1.0)
Monocytes Relative: 18 %
Neutro Abs: 1.5 10*3/uL — ABNORMAL LOW (ref 1.7–7.7)
Neutrophils Relative %: 39 %
Platelet Count: 268 10*3/uL (ref 150–400)
RBC: 4.83 MIL/uL (ref 4.22–5.81)
RDW: 12.6 % (ref 11.5–15.5)
WBC Count: 3.7 10*3/uL — ABNORMAL LOW (ref 4.0–10.5)
nRBC: 0 % (ref 0.0–0.2)

## 2021-09-01 LAB — CMP (CANCER CENTER ONLY)
ALT: 20 U/L (ref 0–44)
AST: 20 U/L (ref 15–41)
Albumin: 4.6 g/dL (ref 3.5–5.0)
Alkaline Phosphatase: 50 U/L (ref 38–126)
Anion gap: 7 (ref 5–15)
BUN: 18 mg/dL (ref 6–20)
CO2: 26 mmol/L (ref 22–32)
Calcium: 9.9 mg/dL (ref 8.9–10.3)
Chloride: 104 mmol/L (ref 98–111)
Creatinine: 0.81 mg/dL (ref 0.61–1.24)
GFR, Estimated: >60 mL/min (ref >60)
Glucose, Bld: 107 mg/dL — ABNORMAL HIGH (ref 70–99)
Potassium: 4.4 mmol/L (ref 3.5–5.1)
Sodium: 137 mmol/L (ref 135–145)
Total Bilirubin: 0.6 mg/dL (ref 0.3–1.2)
Total Protein: 7 g/dL (ref 6.5–8.1)

## 2021-09-01 LAB — LACTATE DEHYDROGENASE: LDH: 266 U/L — ABNORMAL HIGH (ref 98–192)

## 2021-09-01 MED ORDER — FAMOTIDINE IN NACL 20-0.9 MG/50ML-% IV SOLN
20.0000 mg | Freq: Once | INTRAVENOUS | Status: AC
  Administered 2021-09-01: 20 mg via INTRAVENOUS
  Filled 2021-09-01: qty 50

## 2021-09-01 MED ORDER — ACETAMINOPHEN 500 MG PO TABS
1000.0000 mg | ORAL_TABLET | Freq: Once | ORAL | Status: AC
  Administered 2021-09-01: 1000 mg via ORAL
  Filled 2021-09-01: qty 2

## 2021-09-01 MED ORDER — SODIUM CHLORIDE 0.9 % IV SOLN
1000.0000 mg | Freq: Once | INTRAVENOUS | Status: AC
  Administered 2021-09-01: 1000 mg via INTRAVENOUS
  Filled 2021-09-01: qty 40

## 2021-09-01 MED ORDER — DIPHENHYDRAMINE HCL 50 MG/ML IJ SOLN
50.0000 mg | Freq: Once | INTRAMUSCULAR | Status: AC
  Administered 2021-09-01: 50 mg via INTRAVENOUS
  Filled 2021-09-01: qty 1

## 2021-09-01 MED ORDER — SODIUM CHLORIDE 0.9 % IV SOLN
20.0000 mg | Freq: Once | INTRAVENOUS | Status: AC
  Administered 2021-09-01: 20 mg via INTRAVENOUS
  Filled 2021-09-01: qty 20

## 2021-09-01 MED ORDER — MEPERIDINE HCL 25 MG/ML IJ SOLN
25.0000 mg | Freq: Once | INTRAMUSCULAR | Status: AC
  Administered 2021-09-01: 25 mg via INTRAVENOUS
  Filled 2021-09-01: qty 1

## 2021-09-01 MED ORDER — SODIUM CHLORIDE 0.9 % IV SOLN: Freq: Once | INTRAVENOUS | Status: AC

## 2021-09-01 MED ORDER — MONTELUKAST SODIUM 10 MG PO TABS
10.0000 mg | ORAL_TABLET | Freq: Once | ORAL | Status: AC
  Administered 2021-09-01: 10 mg via ORAL
  Filled 2021-09-01: qty 1

## 2021-09-01 NOTE — Patient Instructions (Signed)
[facility]  Discharge Instructions: ?Thank you for choosing [facility] to provide your oncology and hematology care.  ? ?If you have a lab appointment with the [facility], please go directly to the [facility] and check in at the registration area. ?  ?Wear comfortable clothing and clothing appropriate for easy access to any Portacath or PICC line.  ? ?We strive to give you quality time with your provider. You may need to reschedule your appointment if you arrive late (15 or more minutes).  Arriving late affects you and other patients whose appointments are after yours.  Also, if you miss three or more appointments without notifying the office, you may be dismissed from the clinic at the provider?s discretion.    ?  ?For prescription refill requests, have your pharmacy contact our office and allow 72 hours for refills to be completed.   ? ?Today you received the following chemotherapy and/or immunotherapy agents gazyva    ?  ?To help prevent nausea and vomiting after your treatment, we encourage you to take your nausea medication as directed. ? ?BELOW ARE SYMPTOMS THAT SHOULD BE REPORTED IMMEDIATELY: ?*FEVER GREATER THAN 100.4 F (38 ?C) OR HIGHER ?*CHILLS OR SWEATING ?*NAUSEA AND VOMITING THAT IS NOT CONTROLLED WITH YOUR NAUSEA MEDICATION ?*UNUSUAL SHORTNESS OF BREATH ?*UNUSUAL BRUISING OR BLEEDING ?*URINARY PROBLEMS (pain or burning when urinating, or frequent urination) ?*BOWEL PROBLEMS (unusual diarrhea, constipation, pain near the anus) ?TENDERNESS IN MOUTH AND THROAT WITH OR WITHOUT PRESENCE OF ULCERS (sore throat, sores in mouth, or a toothache) ?UNUSUAL RASH, SWELLING OR PAIN  ?UNUSUAL VAGINAL DISCHARGE OR ITCHING  ? ?Items with * indicate a potential emergency and should be followed up as soon as possible or go to the Emergency Department if any problems should occur. ? ?Please show the CHEMOTHERAPY ALERT CARD or IMMUNOTHERAPY ALERT CARD at check-in to the  Emergency Department and triage nurse. ? ?Should you have questions after your visit or need to cancel or reschedule your appointment, please contact [facility]  Dept: [phone]  and follow the prompts.  Office hours are 8:00 a.m. to 4:30 p.m. Monday - Friday. Please note that voicemails left after 4:00 p.m. may not be returned until the following business day.  We are closed weekends and major holidays. You have access to a nurse at all times for urgent questions. Please call the main number to the clinic Dept: [phone] and follow the prompts. ? ? ?For any non-urgent questions, you may also contact your provider using MyChart. We now offer e-Visits for anyone 63 and older to request care online for non-urgent symptoms. For details visit [email or web address]. ?  ?Also download the MyChart app! Go to the app store, search "MyChart", open the app, select [facility], and log in with your MyChart username and password. ? ?Due to Covid, a mask is required upon entering the hospital/clinic. If you do not have a mask, one will be given to you upon arrival. For doctor visits, patients may have 1 support person aged 12 or older with them. For treatment visits, patients cannot have anyone with them due to current Covid guidelines and our immunocompromised population.  ? ?

## 2021-09-03 ENCOUNTER — Telehealth: Payer: Self-pay

## 2021-09-03 NOTE — Telephone Encounter (Signed)
Notified Patient of completion of Attending Physician's Progress Report. Fax transmission confirmation received. Copy of Form mailed to Patient as requested. Request for medical records forwarded as an urgent request to [facility] Management Department with signed Release of Information Form. No other needs or concerns voiced at this time. ?

## 2021-09-03 NOTE — Telephone Encounter (Signed)
Notified Patient of completion of Attending Physician's Progress Report. Fax Transmission confirmation received. Copy of Form mailed to Patient as requested. Request for Medical Records forwarded as an urgent request to [facility] Management Department with signed Release of Information form. No other needs or concerns voiced at this time. ?

## 2021-09-07 ENCOUNTER — Encounter: Payer: Self-pay | Admitting: Hematology

## 2021-09-07 NOTE — Progress Notes (Signed)
° ° °HEMATOLOGY/ONCOLOGY CLINIC NOTE ° °Date of Service: 09/01/2021 ° °Patient Care Team: °Dewey, Elizabeth R, MD as PCP - General (Family Medicine) ° °CHIEF COMPLAINTS/PURPOSE OF CONSULTATION:  °Continue evaluation and management of CLL and toxicity check for venetoclax ° °HISTORY OF PRESENTING ILLNESS:  °See previous note for details on initial presentation. ° °Interval History: ° °Jordan Schroeder is here for further evaluation and management of his CLL and prior to his cycle 4 of Gazyva infusion.  He notes that he is tolerating his 200 mg of venetoclax without any acute new concerns. °He notes no fevers chills night sweats or new lumps or bumps. ° °Labs done today were reviewed with him in detail. °No new infection issues. ° °MEDICAL HISTORY:  °Past Medical History:  °Diagnosis Date  ° High cholesterol   ° Hypertension   ° Leukemia ([facility])   ° ° °SURGICAL HISTORY: °No past surgical history on file. ° °SOCIAL HISTORY: °Social History  ° °Socioeconomic History  ° Marital status: Married  °  Spouse name: Not on file  ° Number of children: Not on file  ° Years of education: Not on file  ° Highest education level: Not on file  °Occupational History  ° Not on file  °Tobacco Use  ° Smoking status: Never  ° Smokeless tobacco: Never  °Vaping Use  ° Vaping Use: Never used  °Substance and Sexual Activity  ° Alcohol use: Not Currently  ° Drug use: Never  ° Sexual activity: Not on file  °Other Topics Concern  ° Not on file  °Social History Narrative  ° Not on file  ° °Social Determinants of Health  ° °Financial Resource Strain: Not on file  °Food Insecurity: Not on file  °Transportation Needs: Not on file  °Physical Activity: Not on file  °Stress: Not on file  °Social Connections: Not on file  °Intimate Partner Violence: Not on file  ° ° °FAMILY HISTORY: °No family history on file. ° °ALLERGIES:  is allergic to gazyva [obinutuzumab] and rituximab. ° °MEDICATIONS:  °Current Outpatient Medications  °Medication Sig Dispense Refill  °  acyclovir (ZOVIRAX) 400 MG tablet Take 1 tablet (400 mg total) by mouth daily. (Patient not taking: Reported on 08/13/2019) 30 tablet 3  ° allopurinol (ZYLOPRIM) 100 MG tablet Take 1 tablet (100 mg total) by mouth 2 (two) times daily. 60 tablet 2  ° dexamethasone (DECADRON) 4 MG tablet Take 2 tablets (8 mg total) by mouth daily. Start the day after bendamustine chemotherapy for 2 days. Take with food. (Patient not taking: Reported on 08/13/2019) 30 tablet 1  ° fenofibrate micronized (LOFIBRA) 67 MG capsule Take 67 mg by mouth daily before breakfast.    ° lisinopril (PRINIVIL,ZESTRIL) 10 MG tablet Take 10 mg by mouth daily.    ° ondansetron (ZOFRAN-ODT) 4 MG disintegrating tablet 4mg ODT q4 hours prn nausea/vomit 10 tablet 0  ° sertraline (ZOLOFT) 100 MG tablet Take 100 mg by mouth daily.    ° venetoclax (VENCLEXTA) 100 MG tablet Take 2 tablets (200 mg total) by mouth daily. Tablets should be swallowed whole with a meal and a full glass of water. 60 tablet 1  ° VITAMIN D PO Take 2,000 Units by mouth.    ° °No current facility-administered medications for this visit.  ° ° °REVIEW OF SYSTEMS:   °10 Point review of Systems was done is negative except as noted above. ° °PHYSICAL EXAMINATION: °.BP 107/70    Pulse 99    Temp (!) 97.5 °F (36.4 °C)      Resp 18    Wt 279 lb 11.2 oz (126.9 kg)    SpO2 96%    BMI 42.53 kg/m²  °NAD °GENERAL:alert, in no acute distress and comfortable °SKIN: no acute rashes, no significant lesions °EYES: conjunctiva are pink and non-injected, sclera anicteric °OROPHARYNX: MMM, no exudates, no oropharyngeal erythema or ulceration °NECK: supple, no JVD °LYMPH:  no palpable lymphadenopathy in the cervical, axillary or inguinal regions °LUNGS: clear to auscultation b/l with normal respiratory effort °HEART: regular rate & rhythm °ABDOMEN:  normoactive bowel sounds , non tender, not distended. °Extremity: no pedal edema °PSYCH: alert & oriented x 3 with fluent speech °NEURO: no focal motor/sensory  deficits ° ° °LABORATORY DATA:  ° °. °CBC Latest Ref Rng & Units 09/01/2021 08/25/2021 08/18/2021  °WBC 4.0 - 10.5 K/uL 3.7(L) 3.3(L) 4.2  °Hemoglobin 13.0 - 17.0 g/dL 15.2 14.3 14.5  °Hematocrit 39.0 - 52.0 % 42.4 40.7 42.1  °Platelets 150 - 400 K/uL 268 206 202  ° °CMP Latest Ref Rng & Units 09/01/2021 08/25/2021 08/18/2021  °Glucose 70 - 99 mg/dL 107(H) 97 100(H)  °BUN 6 - 20 mg/dL 18 12 13  °Creatinine 0.61 - 1.24 mg/dL 0.81 0.83 0.76  °Sodium 135 - 145 mmol/L 137 140 137  °Potassium 3.5 - 5.1 mmol/L 4.4 4.2 4.1  °Chloride 98 - 111 mmol/L 104 106 107  °CO2 22 - 32 mmol/L 26 30 24  °Calcium 8.9 - 10.3 mg/dL 9.9 9.5 9.4  °Total Protein 6.5 - 8.1 g/dL 7.0 6.3(L) 6.7  °Total Bilirubin 0.3 - 1.2 mg/dL 0.6 0.6 0.8  °Alkaline Phos 38 - 126 U/L 50 58 47  °AST 15 - 41 U/L 20 25 31  °ALT 0 - 44 U/L 20 26 33  ° °. °Lab Results  °Component Value Date  ° LDH 266 (H) 09/01/2021  ° °Uric acid 3.8 ° ° ° °Component °    Latest Ref Rng & Units 10/30/2017  °HIV Screen 4th Generation wRfx °    Non Reactive Non Reactive  °HCV Ab °    0.0 - 0.9 s/co ratio <0.1  °Hepatitis B Surface Ag °    Negative Negative  °Hep B Core Ab, Tot °    Negative Negative  ° ° ° ° °10/25/17 Tissue Flow Cytometry: ° ° ° °10/25/17 Needle/core Bx: ° ° °FISH Oncology  °Order: [id number]  °Status:  Final result   Visible to patient:  No (Not Released) Next appt:  11/07/2017 at 09:00 AM in Radiology ([facility]) Dx:  Lymphocytosis  °Component 1mo ago  °Specimen Type Comment:   °Comment: BLOOD  °Cells Counted 200   °Cells Analyzed 200   °FISH Result Comment:   °Comment: NORMAL:  NO CYCLIN D1 OR IGH GENE REARRANGEMENT OBSERVED  °Interpretation Comment:   °Comment: (NOTE)  °             nuc ish 11q13(CCND1x2),14q32(IGHx2)[200]  °     The fluorescence in situ hybridization (FISH) result  °was normal. Dual color dual fusion DNA FISH probes targeting  °the cyclin D1 gene (CCND1 or BCL1)([employer] and the  °heavy chain immunoglobulin gene (IgH) at 14q32 showed two   °hybridization signals each with no fusion in all cells  °examined. Thus, there was NO apparent rearrangement of the  °primary genes involved with mantle cell lymphoma, and to a  °lesser extent myeloma.  .   °  °  ° ° °RADIOGRAPHIC STUDIES: °I have personally reviewed the radiological images as listed and agreed with   listed and agreed with the findings in the report. No results found. CT chest abdomen pelvis 03/26/2021: IMPRESSION: 1. Interval development of bulky lymphadenopathy in the chest, abdomen, and pelvis. Imaging features compatible with recurrent lymphoma. 2. No hepatosplenomegaly. 3. Enlargement of the pulmonary outflow tract and main pulmonary arteries suggests pulmonary arterial hypertension. 4. 2-3 mm left upper lobe pulmonary nodule was largely obscured on the prior study. Most likely benign, attention on follow-up recommended.     Electronically Signed   By: Misty Stanley M.D.  CT neck 03/26/2021 IMPRESSION: 1. Limited direct comparison to prior PET-CT (no prior neck CTs) with possible slight increase in size of a few left submandibular nodes. Otherwise, similar increased number of lymph nodes throughout the neck without substantial change in multiple mildly enlarged nodes (detailed above). 2. Oval 2.1 x 1.3 cm mass in the posterior right nasal cavity, nonspecific but potentially a polyp or retention cyst. Recommend correlation with direct inspection.     ASSESSMENT & PLAN:   41 y.o. male with  1.  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 Noted 13q deletion: 13q14.3 is at 47.67% for normal nuclei, and 28.41% with mono allelic deletion of [id number].  -ATM for 11q22.3 revealed 81.67% with normal nuclei and 18.33% with positive nuclei for del 11q22.3   02/26/18 CT C/A/P revealed  Marked response to therapy as evidenced by decrease in size or resolution of adenopathy throughout chest, abdomen and pelvis. 2. Hepatic steatosis.  Slight marginal irregularity suggests cirrhosis. 3. Enlarged pulmonary arteries, indicative of pulmonary arterial hypertension.   S/p 6 cycles of BR completed on 04/19/18 for first-line treatment  06/04/18 CT C/A/P revealed Stable CT chest without thoracic lymphadenopathy. Continued further decrease in abdominal and pelvic lymphadenopathy. No new or progressive lymphadenopathy on today's exam. 2. Hepatic steatosis. Subtle nodularity of liver contour raises the question of cirrhosis. 3. Pulmonary arterial enlargement. Pulmonary arterial hypertension a distinct concern.  2. H/o Rituxan infusion reaction-  -Had significant flushing with rituxan needing steroids. Not a candidate in future for rapid infusion protocol.  3.  History of some rigors and flushing with Gazyva controlled with adding Demerol and adjusting premedications.  4. Sleep apnea Notes he had a sleep study since his last clinic visit and diagnosis was confirmed. -uses CPAP  5.  Recent history of influenza A about 7 to 10 days ago treated with Tamiflu.  Symptoms resolved.  He now has some mild persistent cough with increased secretions possible mild bronchitis -Symptoms resolved  6.  Mildly elevated transaminases less than 2 times upper limit of normal likely related to his recent influenza A and Tamiflu.  We will monitor-now resolved PLAN: -Patient labs done today were reviewed in detail  Follow-up Please schedule cycle 5 and cycle 6 of Gazyva as per orders with labs and MD visits  Sullivan Lone MD [place] AAHIVMS [facility] Hematology/Oncology Physician [facility]

## 2021-09-08 ENCOUNTER — Inpatient Hospital Stay: Payer: No Typology Code available for payment source

## 2021-09-08 ENCOUNTER — Other Ambulatory Visit: Payer: Self-pay

## 2021-09-08 DIAGNOSIS — Z5112 Encounter for antineoplastic immunotherapy: Secondary | ICD-10-CM | POA: Diagnosis not present

## 2021-09-08 DIAGNOSIS — C9110 Chronic lymphocytic leukemia of B-cell type not having achieved remission: Secondary | ICD-10-CM

## 2021-09-08 LAB — CBC WITH DIFFERENTIAL (CANCER CENTER ONLY)
Abs Immature Granulocytes: 0.04 10*3/uL (ref 0.00–0.07)
Basophils Absolute: 0 10*3/uL (ref 0.0–0.1)
Basophils Relative: 1 %
Eosinophils Absolute: 0 10*3/uL (ref 0.0–0.5)
Eosinophils Relative: 1 %
HCT: 43 % (ref 39.0–52.0)
Hemoglobin: 14.8 g/dL (ref 13.0–17.0)
Immature Granulocytes: 1 %
Lymphocytes Relative: 35 %
Lymphs Abs: 1.4 10*3/uL (ref 0.7–4.0)
MCH: 30.7 pg (ref 26.0–34.0)
MCHC: 34.4 g/dL (ref 30.0–36.0)
MCV: 89.2 fL (ref 80.0–100.0)
Monocytes Absolute: 0.8 10*3/uL (ref 0.1–1.0)
Monocytes Relative: 20 %
Neutro Abs: 1.7 10*3/uL (ref 1.7–7.7)
Neutrophils Relative %: 42 %
Platelet Count: 218 10*3/uL (ref 150–400)
RBC: 4.82 MIL/uL (ref 4.22–5.81)
RDW: 12.7 % (ref 11.5–15.5)
WBC Count: 3.9 10*3/uL — ABNORMAL LOW (ref 4.0–10.5)
nRBC: 0 % (ref 0.0–0.2)

## 2021-09-08 LAB — CMP (CANCER CENTER ONLY)
ALT: 25 U/L (ref 0–44)
AST: 20 U/L (ref 15–41)
Albumin: 4.5 g/dL (ref 3.5–5.0)
Alkaline Phosphatase: 56 U/L (ref 38–126)
Anion gap: 5 (ref 5–15)
BUN: 13 mg/dL (ref 6–20)
CO2: 26 mmol/L (ref 22–32)
Calcium: 9.6 mg/dL (ref 8.9–10.3)
Chloride: 106 mmol/L (ref 98–111)
Creatinine: 0.74 mg/dL (ref 0.61–1.24)
GFR, Estimated: >60 mL/min (ref >60)
Glucose, Bld: 101 mg/dL — ABNORMAL HIGH (ref 70–99)
Potassium: 4.2 mmol/L (ref 3.5–5.1)
Sodium: 137 mmol/L (ref 135–145)
Total Bilirubin: 0.6 mg/dL (ref 0.3–1.2)
Total Protein: 6.3 g/dL — ABNORMAL LOW (ref 6.5–8.1)

## 2021-09-08 LAB — LACTATE DEHYDROGENASE: LDH: 136 U/L (ref 98–192)

## 2021-09-13 ENCOUNTER — Other Ambulatory Visit (HOSPITAL_COMMUNITY): Payer: Self-pay

## 2021-09-15 ENCOUNTER — Other Ambulatory Visit: Payer: Self-pay

## 2021-09-15 ENCOUNTER — Inpatient Hospital Stay: Payer: No Typology Code available for payment source

## 2021-09-15 DIAGNOSIS — Z5112 Encounter for antineoplastic immunotherapy: Secondary | ICD-10-CM | POA: Diagnosis not present

## 2021-09-15 DIAGNOSIS — C9110 Chronic lymphocytic leukemia of B-cell type not having achieved remission: Secondary | ICD-10-CM

## 2021-09-15 LAB — CMP (CANCER CENTER ONLY)
ALT: 25 U/L (ref 0–44)
AST: 21 U/L (ref 15–41)
Albumin: 4.3 g/dL (ref 3.5–5.0)
Alkaline Phosphatase: 59 U/L (ref 38–126)
Anion gap: 5 (ref 5–15)
BUN: 14 mg/dL (ref 6–20)
CO2: 28 mmol/L (ref 22–32)
Calcium: 9.8 mg/dL (ref 8.9–10.3)
Chloride: 105 mmol/L (ref 98–111)
Creatinine: 0.81 mg/dL (ref 0.61–1.24)
GFR, Estimated: >60 mL/min (ref >60)
Glucose, Bld: 132 mg/dL — ABNORMAL HIGH (ref 70–99)
Potassium: 3.9 mmol/L (ref 3.5–5.1)
Sodium: 138 mmol/L (ref 135–145)
Total Bilirubin: 0.6 mg/dL (ref 0.3–1.2)
Total Protein: 6.5 g/dL (ref 6.5–8.1)

## 2021-09-15 LAB — LACTATE DEHYDROGENASE: LDH: 127 U/L (ref 98–192)

## 2021-09-15 LAB — CBC WITH DIFFERENTIAL (CANCER CENTER ONLY)
Abs Immature Granulocytes: 0.06 10*3/uL (ref 0.00–0.07)
Basophils Absolute: 0.1 10*3/uL (ref 0.0–0.1)
Basophils Relative: 1 %
Eosinophils Absolute: 0 10*3/uL (ref 0.0–0.5)
Eosinophils Relative: 0 %
HCT: 41.5 % (ref 39.0–52.0)
Hemoglobin: 14.7 g/dL (ref 13.0–17.0)
Immature Granulocytes: 1 %
Lymphocytes Relative: 14 %
Lymphs Abs: 1.1 10*3/uL (ref 0.7–4.0)
MCH: 30.9 pg (ref 26.0–34.0)
MCHC: 35.4 g/dL (ref 30.0–36.0)
MCV: 87.4 fL (ref 80.0–100.0)
Monocytes Absolute: 0.9 10*3/uL (ref 0.1–1.0)
Monocytes Relative: 11 %
Neutro Abs: 5.9 10*3/uL (ref 1.7–7.7)
Neutrophils Relative %: 73 %
Platelet Count: 191 10*3/uL (ref 150–400)
RBC: 4.75 MIL/uL (ref 4.22–5.81)
RDW: 12.9 % (ref 11.5–15.5)
WBC Count: 8.1 10*3/uL (ref 4.0–10.5)
nRBC: 0 % (ref 0.0–0.2)

## 2021-09-21 ENCOUNTER — Other Ambulatory Visit (HOSPITAL_COMMUNITY): Payer: Self-pay

## 2021-09-29 ENCOUNTER — Inpatient Hospital Stay: Payer: No Typology Code available for payment source

## 2021-09-29 ENCOUNTER — Other Ambulatory Visit: Payer: Self-pay

## 2021-09-29 ENCOUNTER — Inpatient Hospital Stay: Payer: No Typology Code available for payment source | Admitting: Hematology

## 2021-09-29 VITALS — BP 109/74 | HR 93 | Temp 97.5°F | Resp 18

## 2021-09-29 VITALS — BP 103/73 | HR 100 | Temp 99.0°F | Resp 20 | Wt 288.9 lb

## 2021-09-29 DIAGNOSIS — C9110 Chronic lymphocytic leukemia of B-cell type not having achieved remission: Secondary | ICD-10-CM

## 2021-09-29 DIAGNOSIS — Z5111 Encounter for antineoplastic chemotherapy: Secondary | ICD-10-CM | POA: Diagnosis not present

## 2021-09-29 DIAGNOSIS — Z5112 Encounter for antineoplastic immunotherapy: Secondary | ICD-10-CM | POA: Diagnosis not present

## 2021-09-29 DIAGNOSIS — Z7189 Other specified counseling: Secondary | ICD-10-CM

## 2021-09-29 LAB — CMP (CANCER CENTER ONLY)
ALT: 32 U/L (ref 0–44)
AST: 27 U/L (ref 15–41)
Albumin: 4.5 g/dL (ref 3.5–5.0)
Alkaline Phosphatase: 66 U/L (ref 38–126)
Anion gap: 6 (ref 5–15)
BUN: 17 mg/dL (ref 6–20)
CO2: 24 mmol/L (ref 22–32)
Calcium: 9.3 mg/dL (ref 8.9–10.3)
Chloride: 107 mmol/L (ref 98–111)
Creatinine: 0.82 mg/dL (ref 0.61–1.24)
GFR, Estimated: >60 mL/min (ref >60)
Glucose, Bld: 94 mg/dL (ref 70–99)
Potassium: 4 mmol/L (ref 3.5–5.1)
Sodium: 137 mmol/L (ref 135–145)
Total Bilirubin: 0.6 mg/dL (ref 0.3–1.2)
Total Protein: 6.5 g/dL (ref 6.5–8.1)

## 2021-09-29 LAB — CBC WITH DIFFERENTIAL (CANCER CENTER ONLY)
Abs Immature Granulocytes: 0.02 10*3/uL (ref 0.00–0.07)
Basophils Absolute: 0 10*3/uL (ref 0.0–0.1)
Basophils Relative: 1 %
Eosinophils Absolute: 0.1 10*3/uL (ref 0.0–0.5)
Eosinophils Relative: 2 %
HCT: 42.3 % (ref 39.0–52.0)
Hemoglobin: 15.2 g/dL (ref 13.0–17.0)
Immature Granulocytes: 1 %
Lymphocytes Relative: 41 %
Lymphs Abs: 1.5 10*3/uL (ref 0.7–4.0)
MCH: 31.5 pg (ref 26.0–34.0)
MCHC: 35.9 g/dL (ref 30.0–36.0)
MCV: 87.6 fL (ref 80.0–100.0)
Monocytes Absolute: 0.6 10*3/uL (ref 0.1–1.0)
Monocytes Relative: 17 %
Neutro Abs: 1.4 10*3/uL — ABNORMAL LOW (ref 1.7–7.7)
Neutrophils Relative %: 38 %
Platelet Count: 208 10*3/uL (ref 150–400)
RBC: 4.83 MIL/uL (ref 4.22–5.81)
RDW: 12.8 % (ref 11.5–15.5)
WBC Count: 3.5 10*3/uL — ABNORMAL LOW (ref 4.0–10.5)
nRBC: 0 % (ref 0.0–0.2)

## 2021-09-29 LAB — LACTATE DEHYDROGENASE: LDH: 132 U/L (ref 98–192)

## 2021-09-29 MED ORDER — SODIUM CHLORIDE 0.9 % IV SOLN
1000.0000 mg | Freq: Once | INTRAVENOUS | Status: AC
  Administered 2021-09-29: 1000 mg via INTRAVENOUS
  Filled 2021-09-29: qty 40

## 2021-09-29 MED ORDER — MONTELUKAST SODIUM 10 MG PO TABS
10.0000 mg | ORAL_TABLET | Freq: Once | ORAL | Status: AC
  Administered 2021-09-29: 10 mg via ORAL
  Filled 2021-09-29: qty 1

## 2021-09-29 MED ORDER — SODIUM CHLORIDE 0.9 % IV SOLN: Freq: Once | INTRAVENOUS | Status: AC

## 2021-09-29 MED ORDER — ACETAMINOPHEN 500 MG PO TABS
1000.0000 mg | ORAL_TABLET | Freq: Once | ORAL | Status: AC
  Administered 2021-09-29: 1000 mg via ORAL
  Filled 2021-09-29: qty 2

## 2021-09-29 MED ORDER — DIPHENHYDRAMINE HCL 50 MG/ML IJ SOLN
50.0000 mg | Freq: Once | INTRAMUSCULAR | Status: AC
  Administered 2021-09-29: 50 mg via INTRAVENOUS
  Filled 2021-09-29: qty 1

## 2021-09-29 MED ORDER — MEPERIDINE HCL 25 MG/ML IJ SOLN
25.0000 mg | Freq: Once | INTRAMUSCULAR | Status: AC
  Administered 2021-09-29: 25 mg via INTRAVENOUS
  Filled 2021-09-29: qty 1

## 2021-09-29 MED ORDER — SODIUM CHLORIDE 0.9 % IV SOLN
20.0000 mg | Freq: Once | INTRAVENOUS | Status: AC
  Administered 2021-09-29: 20 mg via INTRAVENOUS
  Filled 2021-09-29: qty 20

## 2021-09-29 MED ORDER — FAMOTIDINE IN NACL 20-0.9 MG/50ML-% IV SOLN
20.0000 mg | Freq: Once | INTRAVENOUS | Status: AC
  Administered 2021-09-29: 20 mg via INTRAVENOUS
  Filled 2021-09-29: qty 50

## 2021-09-29 NOTE — Patient Instructions (Signed)
[facility]  Discharge Instructions: ?Thank you for choosing [facility] to provide your oncology and hematology care.  ? ?If you have a lab appointment with the [facility], please go directly to the [facility] and check in at the registration area. ?  ?Wear comfortable clothing and clothing appropriate for easy access to any Portacath or PICC line.  ? ?We strive to give you quality time with your provider. You may need to reschedule your appointment if you arrive late (15 or more minutes).  Arriving late affects you and other patients whose appointments are after yours.  Also, if you miss three or more appointments without notifying the office, you may be dismissed from the clinic at the provider?s discretion.    ?  ?For prescription refill requests, have your pharmacy contact our office and allow 72 hours for refills to be completed.   ? ?Today you received the following chemotherapy and/or immunotherapy agents: Obinutuzumab.    ?  ?To help prevent nausea and vomiting after your treatment, we encourage you to take your nausea medication as directed. ? ?BELOW ARE SYMPTOMS THAT SHOULD BE REPORTED IMMEDIATELY: ?*FEVER GREATER THAN 100.4 F (38 ?C) OR HIGHER ?*CHILLS OR SWEATING ?*NAUSEA AND VOMITING THAT IS NOT CONTROLLED WITH YOUR NAUSEA MEDICATION ?*UNUSUAL SHORTNESS OF BREATH ?*UNUSUAL BRUISING OR BLEEDING ?*URINARY PROBLEMS (pain or burning when urinating, or frequent urination) ?*BOWEL PROBLEMS (unusual diarrhea, constipation, pain near the anus) ?TENDERNESS IN MOUTH AND THROAT WITH OR WITHOUT PRESENCE OF ULCERS (sore throat, sores in mouth, or a toothache) ?UNUSUAL RASH, SWELLING OR PAIN  ?UNUSUAL VAGINAL DISCHARGE OR ITCHING  ? ?Items with * indicate a potential emergency and should be followed up as soon as possible or go to the Emergency Department if any problems should occur. ? ?Please show the CHEMOTHERAPY ALERT CARD or IMMUNOTHERAPY ALERT CARD at check-in  to the Emergency Department and triage nurse. ? ?Should you have questions after your visit or need to cancel or reschedule your appointment, please contact [facility]  Dept: [phone]  and follow the prompts.  Office hours are 8:00 a.m. to 4:30 p.m. Monday - Friday. Please note that voicemails left after 4:00 p.m. may not be returned until the following business day.  We are closed weekends and major holidays. You have access to a nurse at all times for urgent questions. Please call the main number to the clinic Dept: [phone] and follow the prompts. ? ? ?For any non-urgent questions, you may also contact your provider using MyChart. We now offer e-Visits for anyone 63 and older to request care online for non-urgent symptoms. For details visit [email or web address]. ?  ?Also download the MyChart app! Go to the app store, search "MyChart", open the app, select [facility], and log in with your MyChart username and password. ? ?Due to Covid, a mask is required upon entering the hospital/clinic. If you do not have a mask, one will be given to you upon arrival. For doctor visits, patients may have 1 support person aged 82 or older with them. For treatment visits, patients cannot have anyone with them due to current Covid guidelines and our immunocompromised population.  ? ?

## 2021-09-30 ENCOUNTER — Telehealth: Payer: Self-pay | Admitting: *Deleted

## 2021-09-30 NOTE — Telephone Encounter (Signed)
The [facility] faxed request for medical information and copy of the "09/29/2021 visit notes, physical exam findings with a clear understanding of the following required to support patient request for disability. ?How the condition continues to affect work capacity. ?Date of next office visit.  ?Is the patient released to return to work. ?Return to work date. ?Light Duty or Full Duty ?If light duty, what are the hourly or physical restrictions. ?Restrictions End Date. ? ?Along with operative or procedural notes, PT/OT notes, Diagnostic test results, Outline of treatment plan, Medications and any side effects." ? ?This forms nurse will notify provider of request to submit to (SW) H.I.M. to complete.  Request reads "Please provider information by: ASAP". ?

## 2021-10-05 ENCOUNTER — Encounter: Payer: Self-pay | Admitting: Hematology

## 2021-10-05 NOTE — Progress Notes (Signed)
? ? ?HEMATOLOGY/ONCOLOGY CLINIC NOTE ? ?Date of Service: 09/29/2021 ? ?Patient Care Team: ?Jordan Bien, MD as PCP - General (Family Medicine) ? 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 ?HISTORY OF PRESENTING ILLNESS:  ?See previous note for details on initial presentation. ? ?Interval History: ? ?[name] is here for continued evaluation and management of his CLL and his cycle 5 of Gazyva. ?He continues to be in venetoclax 200 mg p.o. daily. ?He notes no acute new symptoms since his last clinic visit. ?No notable new toxicities from his Gazyva or venetoclax since his last clinic visit about 4 weeks ago. ?No new infection issues.  Good p.o. intake. ?Labs done today were discussed in detail with him. ? ?MEDICAL HISTORY:  ?Past Medical History:  ?Diagnosis Date  ? High cholesterol   ? Hypertension   ? Leukemia ([place])   ? ? ?SURGICAL HISTORY: ?No past surgical history on file. ? ?SOCIAL HISTORY: ?Social History  ? ?Socioeconomic History  ? Marital status: Married  ?  Spouse name: Not on file  ? Number of children: Not on file  ? Years of education: Not on file  ? Highest education level: Not on file  ?Occupational History  ? Not on file  ?Tobacco Use  ? Smoking status: Never  ? Smokeless tobacco: Never  ?Vaping Use  ? Vaping Use: Never used  ?Substance and Sexual Activity  ? Alcohol use: Not Currently  ? Drug use: Never  ? Sexual activity: Not on file  ?Other Topics Concern  ? Not on file  ?Social History Narrative  ? Not on file  ? ?Social Determinants of Health  ? ?Financial Resource Strain: Not on file  ?Food Insecurity: Not on file  ?Transportation Needs: Not on file  ?Physical Activity: Not on file  ?Stress: Not on file  ?Social Connections: Not on file  ?Intimate Partner Violence: Not on file  ? ? ?FAMILY HISTORY: ?No family history on file. ? ?ALLERGIES:  is allergic to gazyva [obinutuzumab] and  rituximab. ? ?MEDICATIONS:  ?Current Outpatient Medications  ?Medication Sig Dispense Refill  ? acyclovir (ZOVIRAX) 400 MG tablet Take 1 tablet (400 mg total) by mouth daily. (Patient not taking: Reported on 08/13/2019) 30 tablet 3  ? allopurinol (ZYLOPRIM) 100 MG tablet Take 1 tablet (100 mg total) by mouth 2 (two) times daily. 60 tablet 2  ? dexamethasone (DECADRON) 4 MG tablet Take 2 tablets (8 mg total) by mouth daily. Start the day after bendamustine chemotherapy for 2 days. Take with food. (Patient not taking: Reported on 08/13/2019) 30 tablet 1  ? fenofibrate micronized (LOFIBRA) 67 MG capsule Take 67 mg by mouth daily before breakfast.    ? lisinopril (PRINIVIL,ZESTRIL) 10 MG tablet Take 10 mg by mouth daily.    ? ondansetron (ZOFRAN-ODT) 4 MG disintegrating tablet 17m ODT q4 hours prn nausea/vomit 10 tablet 0  ? sertraline (ZOLOFT) 100 MG tablet Take 100 mg by mouth daily.    ? venetoclax (VENCLEXTA) 100 MG tablet Take 2 tablets (200 mg total) by mouth daily. Tablets should be swallowed whole with a meal and a full glass of water. 60 tablet 1  ? VITAMIN D PO Take 2,000 Units by mouth.    ? ?No current facility-administered medications for this visit.  ? ? ?REVIEW OF SYSTEMS:   ?10 Point review of Systems was done is negative except as noted above. ? ?PHYSICAL EXAMINATION: ?.BP 103/73  Pulse 100   Temp 99 ?F (37.2 ?C)   Resp 20   Wt 288 lb 14.4 oz (131 kg)   SpO2 96%   BMI 43.93 kg/m?  ?NAD ?GENERAL:alert, in no acute distress and comfortable ?SKIN: no acute rashes, no significant lesions ?EYES: conjunctiva are pink and non-injected, sclera anicteric ?OROPHARYNX: MMM, no exudates, no oropharyngeal erythema or ulceration ?NECK: supple, no JVD ?LYMPH:  no palpable lymphadenopathy in the cervical, axillary or inguinal regions ?LUNGS: clear to auscultation b/l with normal respiratory effort ?HEART: regular rate & rhythm ?ABDOMEN:  normoactive bowel sounds , non tender, not distended. ?Extremity: no pedal  edema ?PSYCH: alert & oriented x 3 with fluent speech ?NEURO: no focal motor/sensory deficits ? ? ?LABORATORY DATA:  ? ?. ? ?  Latest Ref Rng & Units 09/29/2021  ?  9:59 AM 09/15/2021  ? 10:00 AM 09/08/2021  ?  9:59 AM  ?CBC  ?WBC 4.0 - 10.5 K/uL 3.5   8.1   3.9    ?Hemoglobin 13.0 - 17.0 g/dL 15.2   14.7   14.8    ?Hematocrit 39.0 - 52.0 % 42.3   41.5   43.0    ?Platelets 150 - 400 K/uL 208   191   218    ? ? ?  Latest Ref Rng & Units 09/29/2021  ?  9:59 AM 09/15/2021  ? 10:00 AM 09/08/2021  ?  9:59 AM  ?CMP  ?Glucose 70 - 99 mg/dL 94   132   101    ?BUN 6 - 20 mg/dL '17   14   13    ' ?Creatinine 0.61 - 1.24 mg/dL 0.82   0.81   0.74    ?Sodium 135 - 145 mmol/L 137   138   137    ?Potassium 3.5 - 5.1 mmol/L 4.0   3.9   4.2    ?Chloride 98 - 111 mmol/L 107   105   106    ?CO2 22 - 32 mmol/L '24   28   26    ' ?Calcium 8.9 - 10.3 mg/dL 9.3   9.8   9.6    ?Total Protein 6.5 - 8.1 g/dL 6.5   6.5   6.3    ?Total Bilirubin 0.3 - 1.2 mg/dL 0.6   0.6   0.6    ?Alkaline Phos 38 - 126 U/L 66   59   56    ?AST 15 - 41 U/L '27   21   20    ' ?ALT 0 - 44 U/L 32   25   25    ? ?. ?Lab Results  ?Component Value Date  ? LDH 132 09/29/2021  ? ?Uric acid 3.8 ? ? ? ?Component ?    Latest Ref Rng & Units 10/30/2017  ?HIV Screen 4th Generation wRfx ?    Non Reactive Non Reactive  ?HCV Ab ?    0.0 - 0.9 s/co ratio <0.1  ?Hepatitis B Surface Ag ?    Negative Negative  ?Hep B Core Ab, Tot ?    Negative Negative  ? ? ? ? ?10/25/17 Tissue Flow Cytometry: ? ? ? ?10/25/17 Needle/core Bx: ? ? ?[facility]  ?Order: [id number]  ?Status:  Final result   Visible to patient:  No (Not Released) Next appt:  11/07/2017 at 09:00 AM in Radiology [facility] PET) Dx:  Lymphocytosis  ?Component 49moago  ?Specimen Type Comment:   ?Comment: BLOOD  ?Cells Counted 200   ?Cells Analyzed 200   ?  FISH Result Comment:   ?Comment: NORMAL:  NO CYCLIN D1 OR IGH GENE REARRANGEMENT OBSERVED  ?Interpretation Comment:   ?Comment: (NOTE)  ?             nuc ish 11q13(CCND1x2),14q32(IGHx2)[200]  ?      The fluorescence in situ hybridization (FISH) result  ?was normal. Dual color dual fusion DNA FISH probes targeting  ?the cyclin D1 gene (CCND1 or BCL1)([employer] and the  ?heavy chain immunoglobulin gene (IgH) at 14q32 showed two  ?hybridization signals each with no fusion in all cells  ?examined. Thus, there was NO apparent rearrangement of the  ?primary genes involved with mantle cell lymphoma, and to a  ?lesser extent myeloma.  .   ?  ?  ? ? ?RADIOGRAPHIC STUDIES: ?I have personally reviewed the radiological images as listed and agreed with the findings in the report. ?No results found. ?CT chest abdomen pelvis 03/26/2021: IMPRESSION: ?1. Interval development of bulky lymphadenopathy in the chest, ?abdomen, and pelvis. Imaging features compatible with recurrent ?lymphoma. ?2. No hepatosplenomegaly. ?3. Enlargement of the pulmonary outflow tract and main pulmonary ?arteries suggests pulmonary arterial hypertension. ?4. 2-3 mm left upper lobe pulmonary nodule was largely obscured on ?the prior study. Most likely benign, attention on follow-up ?recommended. ?  ?  ?Electronically Signed ?  By: Misty Stanley M.D. ? ?CT neck 03/26/2021 ?IMPRESSION: ?1. Limited direct comparison to prior PET-CT (no prior neck CTs) ?with possible slight increase in size of a few left submandibular ?nodes. Otherwise, similar increased number of lymph nodes throughout ?the neck without substantial change in multiple mildly enlarged ?nodes (detailed above). ?2. Oval 2.1 x 1.3 cm mass in the posterior right nasal cavity, ?nonspecific but potentially a polyp or retention cyst. Recommend ?correlation with direct inspection. ?  ?  ?ASSESSMENT & PLAN:  ? ?41 y.o. male with ? ?1.  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 ?Noted 13q deletion: 13q14.3 is at 47.67% for normal nuclei, and 29.47% with mono allelic deletion of [id number].  ?-ATM for 11q22.3 revealed 81.67% with normal  nuclei and 18.33% with positive nuclei for del 11q22.3  ? ?02/26/18 CT C/A/P revealed  Marked response to therapy as evidenced by decrease in size or resolution of adenopathy throughout chest, abdomen and pelvis

## 2021-10-07 ENCOUNTER — Other Ambulatory Visit (HOSPITAL_COMMUNITY): Payer: Self-pay

## 2021-10-11 ENCOUNTER — Other Ambulatory Visit (HOSPITAL_COMMUNITY): Payer: Self-pay

## 2021-10-12 ENCOUNTER — Other Ambulatory Visit: Payer: Self-pay | Admitting: Hematology

## 2021-10-12 ENCOUNTER — Other Ambulatory Visit (HOSPITAL_COMMUNITY): Payer: Self-pay

## 2021-10-12 MED ORDER — VENETOCLAX 100 MG PO TABS
200.0000 mg | ORAL_TABLET | Freq: Every day | ORAL | 1 refills | Status: DC
Start: 2021-10-12 — End: 2021-12-07
  Filled 2021-10-20: qty 60, 30d supply, fill #0
  Filled 2021-11-10: qty 60, 30d supply, fill #1

## 2021-10-13 ENCOUNTER — Encounter: Payer: Self-pay | Admitting: Hematology

## 2021-10-20 ENCOUNTER — Other Ambulatory Visit (HOSPITAL_COMMUNITY): Payer: Self-pay

## 2021-10-25 ENCOUNTER — Other Ambulatory Visit: Payer: Self-pay

## 2021-10-25 DIAGNOSIS — C9110 Chronic lymphocytic leukemia of B-cell type not having achieved remission: Secondary | ICD-10-CM

## 2021-10-27 ENCOUNTER — Inpatient Hospital Stay: Payer: No Typology Code available for payment source | Admitting: Hematology

## 2021-10-27 ENCOUNTER — Inpatient Hospital Stay: Payer: No Typology Code available for payment source

## 2021-10-27 ENCOUNTER — Inpatient Hospital Stay: Payer: No Typology Code available for payment source | Attending: Hematology

## 2021-10-27 ENCOUNTER — Other Ambulatory Visit: Payer: Self-pay

## 2021-10-27 VITALS — BP 116/64 | HR 99 | Temp 98.2°F | Resp 17

## 2021-10-27 VITALS — BP 103/71 | HR 95 | Temp 98.1°F | Resp 20 | Wt 297.4 lb

## 2021-10-27 DIAGNOSIS — Z5112 Encounter for antineoplastic immunotherapy: Secondary | ICD-10-CM | POA: Insufficient documentation

## 2021-10-27 DIAGNOSIS — C9110 Chronic lymphocytic leukemia of B-cell type not having achieved remission: Secondary | ICD-10-CM

## 2021-10-27 DIAGNOSIS — G4730 Sleep apnea, unspecified: Secondary | ICD-10-CM | POA: Insufficient documentation

## 2021-10-27 DIAGNOSIS — Z5111 Encounter for antineoplastic chemotherapy: Secondary | ICD-10-CM

## 2021-10-27 DIAGNOSIS — I10 Essential (primary) hypertension: Secondary | ICD-10-CM | POA: Insufficient documentation

## 2021-10-27 DIAGNOSIS — E7800 Pure hypercholesterolemia, unspecified: Secondary | ICD-10-CM | POA: Insufficient documentation

## 2021-10-27 DIAGNOSIS — Z79899 Other long term (current) drug therapy: Secondary | ICD-10-CM | POA: Diagnosis not present

## 2021-10-27 DIAGNOSIS — Z7189 Other specified counseling: Secondary | ICD-10-CM

## 2021-10-27 LAB — CMP (CANCER CENTER ONLY)
ALT: 37 U/L (ref 0–44)
AST: 28 U/L (ref 15–41)
Albumin: 4.4 g/dL (ref 3.5–5.0)
Alkaline Phosphatase: 67 U/L (ref 38–126)
Anion gap: 6 (ref 5–15)
BUN: 13 mg/dL (ref 6–20)
CO2: 25 mmol/L (ref 22–32)
Calcium: 9.3 mg/dL (ref 8.9–10.3)
Chloride: 107 mmol/L (ref 98–111)
Creatinine: 0.79 mg/dL (ref 0.61–1.24)
GFR, Estimated: >60 mL/min (ref >60)
Glucose, Bld: 105 mg/dL — ABNORMAL HIGH (ref 70–99)
Potassium: 4.1 mmol/L (ref 3.5–5.1)
Sodium: 138 mmol/L (ref 135–145)
Total Bilirubin: 0.5 mg/dL (ref 0.3–1.2)
Total Protein: 6.3 g/dL — ABNORMAL LOW (ref 6.5–8.1)

## 2021-10-27 LAB — CBC WITH DIFFERENTIAL (CANCER CENTER ONLY)
Abs Immature Granulocytes: 0.1 10*3/uL — ABNORMAL HIGH (ref 0.00–0.07)
Basophils Absolute: 0 10*3/uL (ref 0.0–0.1)
Basophils Relative: 1 %
Eosinophils Absolute: 0 10*3/uL (ref 0.0–0.5)
Eosinophils Relative: 1 %
HCT: 44.7 % (ref 39.0–52.0)
Hemoglobin: 15.3 g/dL (ref 13.0–17.0)
Immature Granulocytes: 2 %
Lymphocytes Relative: 36 %
Lymphs Abs: 1.5 10*3/uL (ref 0.7–4.0)
MCH: 30.3 pg (ref 26.0–34.0)
MCHC: 34.2 g/dL (ref 30.0–36.0)
MCV: 88.5 fL (ref 80.0–100.0)
Monocytes Absolute: 0.9 10*3/uL (ref 0.1–1.0)
Monocytes Relative: 22 %
Neutro Abs: 1.6 10*3/uL — ABNORMAL LOW (ref 1.7–7.7)
Neutrophils Relative %: 38 %
Platelet Count: 232 10*3/uL (ref 150–400)
RBC: 5.05 MIL/uL (ref 4.22–5.81)
RDW: 12.5 % (ref 11.5–15.5)
WBC Count: 4.3 10*3/uL (ref 4.0–10.5)
nRBC: 0 % (ref 0.0–0.2)

## 2021-10-27 LAB — LACTATE DEHYDROGENASE: LDH: 166 U/L (ref 98–192)

## 2021-10-27 MED ORDER — MEPERIDINE HCL 25 MG/ML IJ SOLN
25.0000 mg | Freq: Once | INTRAMUSCULAR | Status: AC
  Administered 2021-10-27: 25 mg via INTRAVENOUS
  Filled 2021-10-27: qty 1

## 2021-10-27 MED ORDER — ACETAMINOPHEN 500 MG PO TABS
1000.0000 mg | ORAL_TABLET | Freq: Once | ORAL | Status: AC
  Administered 2021-10-27: 1000 mg via ORAL
  Filled 2021-10-27: qty 2

## 2021-10-27 MED ORDER — SODIUM CHLORIDE 0.9 % IV SOLN
1000.0000 mg | Freq: Once | INTRAVENOUS | Status: AC
  Administered 2021-10-27: 1000 mg via INTRAVENOUS
  Filled 2021-10-27: qty 40

## 2021-10-27 MED ORDER — DIPHENHYDRAMINE HCL 50 MG/ML IJ SOLN
50.0000 mg | Freq: Once | INTRAMUSCULAR | Status: AC
  Administered 2021-10-27: 50 mg via INTRAVENOUS
  Filled 2021-10-27: qty 1

## 2021-10-27 MED ORDER — SODIUM CHLORIDE 0.9 % IV SOLN: Freq: Once | INTRAVENOUS | Status: AC

## 2021-10-27 MED ORDER — MONTELUKAST SODIUM 10 MG PO TABS
10.0000 mg | ORAL_TABLET | Freq: Once | ORAL | Status: AC
  Administered 2021-10-27: 10 mg via ORAL
  Filled 2021-10-27: qty 1

## 2021-10-27 MED ORDER — FAMOTIDINE IN NACL 20-0.9 MG/50ML-% IV SOLN
20.0000 mg | Freq: Once | INTRAVENOUS | Status: AC
  Administered 2021-10-27: 20 mg via INTRAVENOUS
  Filled 2021-10-27: qty 50

## 2021-10-27 MED ORDER — SODIUM CHLORIDE 0.9 % IV SOLN
20.0000 mg | Freq: Once | INTRAVENOUS | Status: AC
  Administered 2021-10-27: 20 mg via INTRAVENOUS
  Filled 2021-10-27: qty 20

## 2021-10-27 NOTE — Patient Instructions (Signed)
[facility]   ?Discharge Instructions: ?Thank you for choosing [facility] to provide your oncology and hematology care.  ? ?If you have a lab appointment with the [facility], please go directly to the [facility] and check in at the registration area. ?  ?Wear comfortable clothing and clothing appropriate for easy access to any Portacath or PICC line.  ? ?We strive to give you quality time with your provider. You may need to reschedule your appointment if you arrive late (15 or more minutes).  Arriving late affects you and other patients whose appointments are after yours.  Also, if you miss three or more appointments without notifying the office, you may be dismissed from the clinic at the provider?s discretion.    ?  ?For prescription refill requests, have your pharmacy contact our office and allow 72 hours for refills to be completed.   ? ?Today you received the following chemotherapy and/or immunotherapy agents: Obinutuzumab [facility])    ?  ?To help prevent nausea and vomiting after your treatment, we encourage you to take your nausea medication as directed. ? ?BELOW ARE SYMPTOMS THAT SHOULD BE REPORTED IMMEDIATELY: ?*FEVER GREATER THAN 100.4 F (38 ?C) OR HIGHER ?*CHILLS OR SWEATING ?*NAUSEA AND VOMITING THAT IS NOT CONTROLLED WITH YOUR NAUSEA MEDICATION ?*UNUSUAL SHORTNESS OF BREATH ?*UNUSUAL BRUISING OR BLEEDING ?*URINARY PROBLEMS (pain or burning when urinating, or frequent urination) ?*BOWEL PROBLEMS (unusual diarrhea, constipation, pain near the anus) ?TENDERNESS IN MOUTH AND THROAT WITH OR WITHOUT PRESENCE OF ULCERS (sore throat, sores in mouth, or a toothache) ?UNUSUAL RASH, SWELLING OR PAIN  ?UNUSUAL VAGINAL DISCHARGE OR ITCHING  ? ?Items with * indicate a potential emergency and should be followed up as soon as possible or go to the Emergency Department if any problems should occur. ? ?Please show the CHEMOTHERAPY ALERT CARD or IMMUNOTHERAPY ALERT CARD  at check-in to the Emergency Department and triage nurse. ? ?Should you have questions after your visit or need to cancel or reschedule your appointment, please contact [facility]  Dept: [phone]  and follow the prompts.  Office hours are 8:00 a.m. to 4:30 p.m. Monday - Friday. Please note that voicemails left after 4:00 p.m. may not be returned until the following business day.  We are closed weekends and major holidays. You have access to a nurse at all times for urgent questions. Please call the main number to the clinic Dept: [phone] and follow the prompts. ? ? ?For any non-urgent questions, you may also contact your provider using MyChart. We now offer e-Visits for anyone 73 and older to request care online for non-urgent symptoms. For details visit [email or web address]. ?  ?Also download the MyChart app! Go to the app store, search "MyChart", open the app, select [facility], and log in with your MyChart username and password. ? ?Due to Covid, a mask is required upon entering the hospital/clinic. If you do not have a mask, one will be given to you upon arrival. For doctor visits, patients may have 1 support person aged 63 or older with them. For treatment visits, patients cannot have anyone with them due to current Covid guidelines and our immunocompromised population.  ? ?

## 2021-10-28 ENCOUNTER — Telehealth: Payer: Self-pay | Admitting: Hematology

## 2021-10-28 NOTE — Telephone Encounter (Signed)
Per 4/27 in basket called and spoke to pt about appointment.  Pt confirmed appointment  ?

## 2021-10-29 ENCOUNTER — Encounter: Payer: Self-pay | Admitting: Hematology

## 2021-10-29 NOTE — Progress Notes (Signed)
? ? ?HEMATOLOGY/ONCOLOGY CLINIC NOTE ? ?Date of Service: 10/27/2021 ? ?Patient Care Team: ?Jordan Bien, MD as PCP - General (Family Medicine) ? ?CHIEF COMPLAINTS/PURPOSE OF CONSULTATION:  ? follow-up for continued evaluation and management of CLL ? ?HISTORY OF PRESENTING ILLNESS:  ?See previous note for details on initial presentation. ? ?Interval History: ? ?Jordan Schroeder is here for continued evaluation and management of his CLL and for follow-up prior to cycle 6 of Gazyva. ?He notes he has been doing well since his last visit and has no acute new symptoms. ?No fevers chills night sweats or issues with infection. ?Tolerating his venetoclax 200 mg p.o. daily without any issues. ?No significant toxicities from his last cycle of treatment with Gazyva. ?Labs done today stable and were reviewed with the patient in detail. ? ?MEDICAL HISTORY:  ?Past Medical History:  ?Diagnosis Date  ? High cholesterol   ? Hypertension   ? Leukemia ([place])   ? ? ?SURGICAL HISTORY: ?No past surgical history on file. ? ?SOCIAL HISTORY: ?Social History  ? ?Socioeconomic History  ? Marital status: Married  ?  Spouse name: Not on file  ? Number of children: Not on file  ? Years of education: Not on file  ? Highest education level: Not on file  ?Occupational History  ? Not on file  ?Tobacco Use  ? Smoking status: Never  ? Smokeless tobacco: Never  ?Vaping Use  ? Vaping Use: Never used  ?Substance and Sexual Activity  ? Alcohol use: Not Currently  ? Drug use: Never  ? Sexual activity: Not on file  ?Other Topics Concern  ? Not on file  ?Social History Narrative  ? Not on file  ? ?Social Determinants of Health  ? ?Financial Resource Strain: Not on file  ?Food Insecurity: Not on file  ?Transportation Needs: Not on file  ?Physical Activity: Not on file  ?Stress: Not on file  ?Social Connections: Not on file  ?Intimate Partner Violence: Not on file  ? ? ?FAMILY HISTORY: ?No family history on file. ? ?ALLERGIES:  is allergic to gazyva  [obinutuzumab] and rituximab. ? ?MEDICATIONS:  ?Current Outpatient Medications  ?Medication Sig Dispense Refill  ? acyclovir (ZOVIRAX) 400 MG tablet Take 1 tablet (400 mg total) by mouth daily. (Patient not taking: Reported on 08/13/2019) 30 tablet 3  ? allopurinol (ZYLOPRIM) 100 MG tablet TAKE 1 TABLET BY MOUTH TWICE A DAY 180 tablet 1  ? dexamethasone (DECADRON) 4 MG tablet Take 2 tablets (8 mg total) by mouth daily. Start the day after bendamustine chemotherapy for 2 days. Take with food. 30 tablet 1  ? fenofibrate micronized (LOFIBRA) 67 MG capsule Take 67 mg by mouth daily before breakfast.    ? lisinopril (PRINIVIL,ZESTRIL) 10 MG tablet Take 10 mg by mouth daily.    ? ondansetron (ZOFRAN-ODT) 4 MG disintegrating tablet 19m ODT q4 hours prn nausea/vomit 10 tablet 0  ? sertraline (ZOLOFT) 100 MG tablet Take 100 mg by mouth daily.    ? venetoclax (VENCLEXTA) 100 MG tablet Take 2 tablets (200 mg total) by mouth daily. Tablets should be swallowed whole with a meal and a full glass of water. 60 tablet 1  ? VITAMIN D PO Take 2,000 Units by mouth.    ? ?No current facility-administered medications for this visit.  ? ? ?REVIEW OF SYSTEMS:   ?10 Point review of Systems was done is negative except as noted above. ? ?PHYSICAL EXAMINATION: ?.BP 103/71   Pulse 95   Temp 98.1 ?F (36.7 ?  C)   Resp 20   Wt 297 lb 6.4 oz (134.9 kg)   SpO2 96%   BMI 45.22 kg/m?  ?NAD ?GENERAL:alert, in no acute distress and comfortable ?SKIN: no acute rashes, no significant lesions ?EYES: conjunctiva are pink and non-injected, sclera anicteric ?OROPHARYNX: MMM, no exudates, no oropharyngeal erythema or ulceration ?NECK: supple, no JVD ?LYMPH:  no palpable lymphadenopathy in the cervical, axillary or inguinal regions ?LUNGS: clear to auscultation b/l with normal respiratory effort ?HEART: regular rate & rhythm ?ABDOMEN:  normoactive bowel sounds , non tender, not distended. ?Extremity: no pedal edema ?PSYCH: alert & oriented x 3 with fluent  speech ?NEURO: no focal motor/sensory deficits ? ? ?LABORATORY DATA:  ? ?. ? ?  Latest Ref Rng & Units 10/27/2021  ?  9:01 AM 09/29/2021  ?  9:59 AM 09/15/2021  ? 10:00 AM  ?CBC  ?WBC 4.0 - 10.5 K/uL 4.3   3.5   8.1    ?Hemoglobin 13.0 - 17.0 g/dL 15.3   15.2   14.7    ?Hematocrit 39.0 - 52.0 % 44.7   42.3   41.5    ?Platelets 150 - 400 K/uL 232   208   191    ? ? ?  Latest Ref Rng & Units 10/27/2021  ?  9:01 AM 09/29/2021  ?  9:59 AM 09/15/2021  ? 10:00 AM  ?CMP  ?Glucose 70 - 99 mg/dL 105   94   132    ?BUN 6 - 20 mg/dL '13   17   14    ' ?Creatinine 0.61 - 1.24 mg/dL 0.79   0.82   0.81    ?Sodium 135 - 145 mmol/L 138   137   138    ?Potassium 3.5 - 5.1 mmol/L 4.1   4.0   3.9    ?Chloride 98 - 111 mmol/L 107   107   105    ?CO2 22 - 32 mmol/L '25   24   28    ' ?Calcium 8.9 - 10.3 mg/dL 9.3   9.3   9.8    ?Total Protein 6.5 - 8.1 g/dL 6.3   6.5   6.5    ?Total Bilirubin 0.3 - 1.2 mg/dL 0.5   0.6   0.6    ?Alkaline Phos 38 - 126 U/L 67   66   59    ?AST 15 - 41 U/L '28   27   21    ' ?ALT 0 - 44 U/L 37   32   25    ? ?. ?Lab Results  ?Component Value Date  ? LDH 166 10/27/2021  ? ?Uric acid 3.8 ? ? ? ?Component ?    Latest Ref Rng & Units 10/30/2017  ?HIV Screen 4th Generation wRfx ?    Non Reactive Non Reactive  ?HCV Ab ?    0.0 - 0.9 s/co ratio <0.1  ?Hepatitis B Surface Ag ?    Negative Negative  ?Hep B Core Ab, Tot ?    Negative Negative  ? ? ? ? ?10/25/17 Tissue Flow Cytometry: ? ? ? ?10/25/17 Needle/core Bx: ? ? ?[facility]  ?Order: [id number]  ?Status:  Final result   Visible to patient:  No (Not Released) Next appt:  11/07/2017 at 09:00 AM in Radiology [facility] PET) Dx:  Lymphocytosis  ?Component 25moago  ?Specimen Type Comment:   ?Comment: BLOOD  ?Cells Counted 200   ?Cells Analyzed 200   ?FISH Result Comment:   ?Comment: NORMAL:  NO CYCLIN D1 OR IGH GENE REARRANGEMENT OBSERVED  ?Interpretation Comment:   ?Comment: (NOTE)  ?             nuc ish 11q13(CCND1x2),14q32(IGHx2)[200]  ?     The fluorescence in situ hybridization  (FISH) result  ?was normal. Dual color dual fusion DNA FISH probes targeting  ?the cyclin D1 gene (CCND1 or BCL1)([employer] and the  ?heavy chain immunoglobulin gene (IgH) at 14q32 showed two  ?hybridization signals each with no fusion in all cells  ?examined. Thus, there was NO apparent rearrangement of the  ?primary genes involved with mantle cell lymphoma, and to a  ?lesser extent myeloma.  .   ?  ?  ? ? ?RADIOGRAPHIC STUDIES: ?I have personally reviewed the radiological images as listed and agreed with the findings in the report. ?No results found. ?CT chest abdomen pelvis 03/26/2021: IMPRESSION: ?1. Interval development of bulky lymphadenopathy in the chest, ?abdomen, and pelvis. Imaging features compatible with recurrent ?lymphoma. ?2. No hepatosplenomegaly. ?3. Enlargement of the pulmonary outflow tract and main pulmonary ?arteries suggests pulmonary arterial hypertension. ?4. 2-3 mm left upper lobe pulmonary nodule was largely obscured on ?the prior study. Most likely benign, attention on follow-up ?recommended. ?  ?  ?Electronically Signed ?  By: Misty Stanley M.D. ? ?CT neck 03/26/2021 ?IMPRESSION: ?1. Limited direct comparison to prior PET-CT (no prior neck CTs) ?with possible slight increase in size of a few left submandibular ?nodes. Otherwise, similar increased number of lymph nodes throughout ?the neck without substantial change in multiple mildly enlarged ?nodes (detailed above). ?2. Oval 2.1 x 1.3 cm mass in the posterior right nasal cavity, ?nonspecific but potentially a polyp or retention cyst. Recommend ?correlation with direct inspection. ?  ?  ?ASSESSMENT & PLAN:  ? ?41 y.o. male with ? ?1.  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 ?Noted 13q deletion: 13q14.3 is at 47.67% for normal nuclei, and 78.67% with mono allelic deletion of [id number].  ?-ATM for 11q22.3 revealed 81.67% with normal nuclei and 18.33% with positive nuclei for  del 11q22.3  ? ?02/26/18 CT C/A/P revealed  Marked response to therapy as evidenced by decrease in size or resolution of adenopathy throughout chest, abdomen and pelvis. 2. Hepatic steatosis. Slight marginal irregularity s

## 2021-11-10 ENCOUNTER — Other Ambulatory Visit (HOSPITAL_COMMUNITY): Payer: Self-pay

## 2021-11-16 ENCOUNTER — Other Ambulatory Visit (HOSPITAL_COMMUNITY): Payer: Self-pay

## 2021-11-17 ENCOUNTER — Telehealth: Payer: Self-pay

## 2021-11-17 NOTE — Telephone Encounter (Signed)
Clarified patient's expected return to work date. ?Paperwork completed and fax sent successfully.  ?

## 2021-11-27 ENCOUNTER — Other Ambulatory Visit: Payer: Self-pay | Admitting: Hematology

## 2021-11-27 DIAGNOSIS — C9110 Chronic lymphocytic leukemia of B-cell type not having achieved remission: Secondary | ICD-10-CM

## 2021-12-01 ENCOUNTER — Ambulatory Visit: Payer: No Typology Code available for payment source | Admitting: Hematology

## 2021-12-01 ENCOUNTER — Other Ambulatory Visit: Payer: No Typology Code available for payment source

## 2021-12-06 ENCOUNTER — Ambulatory Visit: Payer: No Typology Code available for payment source | Admitting: Hematology

## 2021-12-06 ENCOUNTER — Other Ambulatory Visit: Payer: No Typology Code available for payment source

## 2021-12-07 ENCOUNTER — Other Ambulatory Visit: Payer: Self-pay | Admitting: Hematology

## 2021-12-07 ENCOUNTER — Inpatient Hospital Stay: Payer: No Typology Code available for payment source

## 2021-12-07 ENCOUNTER — Encounter (HOSPITAL_COMMUNITY): Payer: Self-pay

## 2021-12-07 ENCOUNTER — Ambulatory Visit (HOSPITAL_COMMUNITY)
Admission: RE | Admit: 2021-12-07 | Discharge: 2021-12-07 | Disposition: A | Discharge status: Home / Self Care | Payer: No Typology Code available for payment source | Source: Ambulatory Visit | Attending: Hematology | Admitting: Hematology

## 2021-12-07 ENCOUNTER — Other Ambulatory Visit (HOSPITAL_COMMUNITY): Payer: Self-pay

## 2021-12-07 ENCOUNTER — Inpatient Hospital Stay: Payer: No Typology Code available for payment source | Admitting: Hematology

## 2021-12-07 DIAGNOSIS — C9110 Chronic lymphocytic leukemia of B-cell type not having achieved remission: Secondary | ICD-10-CM | POA: Insufficient documentation

## 2021-12-07 MED ORDER — IOHEXOL 300 MG/ML  SOLN
100.0000 mL | Freq: Once | INTRAMUSCULAR | Status: AC | PRN
  Administered 2021-12-07: 100 mL via INTRAVENOUS

## 2021-12-07 MED ORDER — SODIUM CHLORIDE (PF) 0.9 % IJ SOLN
INTRAMUSCULAR | Status: AC
  Filled 2021-12-07: qty 50

## 2021-12-07 MED ORDER — VENETOCLAX 100 MG PO TABS
200.0000 mg | ORAL_TABLET | Freq: Every day | ORAL | 1 refills | Status: DC
  Filled 2021-12-09: qty 60, 30d supply, fill #0
  Filled 2022-01-07: qty 60, 30d supply, fill #1

## 2021-12-09 ENCOUNTER — Other Ambulatory Visit (HOSPITAL_COMMUNITY): Payer: Self-pay

## 2021-12-10 ENCOUNTER — Other Ambulatory Visit (HOSPITAL_COMMUNITY): Payer: Self-pay

## 2021-12-13 ENCOUNTER — Telehealth: Payer: Self-pay | Admitting: Hematology

## 2021-12-13 NOTE — Telephone Encounter (Signed)
.  Called pt per 6/9 inbasket , Patient was unavailable, a message with appt time and date was left with number on file.

## 2021-12-15 ENCOUNTER — Inpatient Hospital Stay: Payer: No Typology Code available for payment source | Admitting: Hematology

## 2021-12-15 ENCOUNTER — Other Ambulatory Visit (HOSPITAL_COMMUNITY): Payer: Self-pay

## 2021-12-15 ENCOUNTER — Inpatient Hospital Stay: Payer: No Typology Code available for payment source

## 2021-12-29 ENCOUNTER — Other Ambulatory Visit: Payer: No Typology Code available for payment source

## 2021-12-29 ENCOUNTER — Ambulatory Visit: Payer: No Typology Code available for payment source | Admitting: Hematology

## 2022-01-07 ENCOUNTER — Other Ambulatory Visit (HOSPITAL_COMMUNITY): Payer: Self-pay

## 2022-01-20 ENCOUNTER — Other Ambulatory Visit: Payer: Self-pay

## 2022-01-20 ENCOUNTER — Other Ambulatory Visit (HOSPITAL_COMMUNITY): Payer: Self-pay

## 2022-01-20 DIAGNOSIS — C9110 Chronic lymphocytic leukemia of B-cell type not having achieved remission: Secondary | ICD-10-CM

## 2022-01-24 ENCOUNTER — Inpatient Hospital Stay: Payer: No Typology Code available for payment source | Attending: Hematology

## 2022-01-24 ENCOUNTER — Other Ambulatory Visit: Payer: Self-pay

## 2022-01-24 ENCOUNTER — Inpatient Hospital Stay: Payer: No Typology Code available for payment source | Admitting: Hematology

## 2022-01-24 VITALS — BP 111/70 | HR 99 | Temp 98.6°F | Resp 20 | Wt 294.4 lb

## 2022-01-24 DIAGNOSIS — R7401 Elevation of levels of liver transaminase levels: Secondary | ICD-10-CM | POA: Diagnosis not present

## 2022-01-24 DIAGNOSIS — D702 Other drug-induced agranulocytosis: Secondary | ICD-10-CM | POA: Diagnosis not present

## 2022-01-24 DIAGNOSIS — C9110 Chronic lymphocytic leukemia of B-cell type not having achieved remission: Secondary | ICD-10-CM

## 2022-01-24 DIAGNOSIS — E7800 Pure hypercholesterolemia, unspecified: Secondary | ICD-10-CM | POA: Diagnosis not present

## 2022-01-24 DIAGNOSIS — Z806 Family history of leukemia: Secondary | ICD-10-CM | POA: Diagnosis not present

## 2022-01-24 DIAGNOSIS — C9190 Lymphoid leukemia, unspecified not having achieved remission: Secondary | ICD-10-CM | POA: Insufficient documentation

## 2022-01-24 DIAGNOSIS — I10 Essential (primary) hypertension: Secondary | ICD-10-CM | POA: Insufficient documentation

## 2022-01-24 DIAGNOSIS — G4730 Sleep apnea, unspecified: Secondary | ICD-10-CM | POA: Insufficient documentation

## 2022-01-24 LAB — CMP (CANCER CENTER ONLY)
ALT: 33 U/L (ref 0–44)
AST: 24 U/L (ref 15–41)
Albumin: 4.4 g/dL (ref 3.5–5.0)
Alkaline Phosphatase: 88 U/L (ref 38–126)
Anion gap: 7 (ref 5–15)
BUN: 12 mg/dL (ref 6–20)
CO2: 26 mmol/L (ref 22–32)
Calcium: 9.7 mg/dL (ref 8.9–10.3)
Chloride: 106 mmol/L (ref 98–111)
Creatinine: 0.84 mg/dL (ref 0.61–1.24)
GFR, Estimated: >60 mL/min (ref >60)
Glucose, Bld: 146 mg/dL — ABNORMAL HIGH (ref 70–99)
Potassium: 4 mmol/L (ref 3.5–5.1)
Sodium: 139 mmol/L (ref 135–145)
Total Bilirubin: 0.5 mg/dL (ref 0.3–1.2)
Total Protein: 6.2 g/dL — ABNORMAL LOW (ref 6.5–8.1)

## 2022-01-24 LAB — CBC WITH DIFFERENTIAL (CANCER CENTER ONLY)
Abs Immature Granulocytes: 0.01 10*3/uL (ref 0.00–0.07)
Basophils Absolute: 0 10*3/uL (ref 0.0–0.1)
Basophils Relative: 1 %
Eosinophils Absolute: 0 10*3/uL (ref 0.0–0.5)
Eosinophils Relative: 1 %
HCT: 43.2 % (ref 39.0–52.0)
Hemoglobin: 15.1 g/dL (ref 13.0–17.0)
Immature Granulocytes: 1 %
Lymphocytes Relative: 41 %
Lymphs Abs: 0.9 10*3/uL (ref 0.7–4.0)
MCH: 31.2 pg (ref 26.0–34.0)
MCHC: 35 g/dL (ref 30.0–36.0)
MCV: 89.3 fL (ref 80.0–100.0)
Monocytes Absolute: 0.4 10*3/uL (ref 0.1–1.0)
Monocytes Relative: 21 %
Neutro Abs: 0.8 10*3/uL — ABNORMAL LOW (ref 1.7–7.7)
Neutrophils Relative %: 35 %
Platelet Count: 205 10*3/uL (ref 150–400)
RBC: 4.84 MIL/uL (ref 4.22–5.81)
RDW: 12.4 % (ref 11.5–15.5)
WBC Count: 2.2 10*3/uL — ABNORMAL LOW (ref 4.0–10.5)
nRBC: 0 % (ref 0.0–0.2)

## 2022-01-24 LAB — LACTATE DEHYDROGENASE: LDH: 160 U/L (ref 98–192)

## 2022-01-24 NOTE — Progress Notes (Signed)
HEMATOLOGY/ONCOLOGY CLINIC NOTE  Date of Service: 01/24/2022  Patient Care Team: [name], MD as PCP - General (Family Medicine)  CHIEF COMPLAINTS/PURPOSE OF CONSULTATION:   follow-up for continued evaluation and management of CLL  HISTORY OF PRESENTING ILLNESS:  See previous note for details on initial presentation.  Interval History: [name] is a 41 y.o. male here for continued evaluation and management of his CLL. He reports He is doing well with no new symptoms or concerns.  He was recommended to maintain healthy lifestyle changes and continue f/u with PCP for weight management.  He has completed all 6 cycles of Gazyva.  He was counseled on neutropenic precautions.  We discussed dose escalation of Venetoclax if counts continue to improve which he was agreeable to.  We discussed his recent CT chest, abd, pelvis done 12/08/2021.  No fever, chills, night sweats. No new infection issues. No other new or acute focal symptoms.  Tolerating his venetoclax 200 mg p.o. daily without any issues.  No significant toxicities from his last cycle of treatment with Gazyva.  Labs done today stable and were reviewed with the patient in detail.  MEDICAL HISTORY:  Past Medical History:  Diagnosis Date   High cholesterol    Hypertension    Leukemia ([place])     SURGICAL HISTORY: No past surgical history on file.  SOCIAL HISTORY: Social History   Socioeconomic History   Marital status: Married    Spouse name: Not on file   Number of children: Not on file   Years of education: Not on file   Highest education level: Not on file  Occupational History   Not on file  Tobacco Use   Smoking status: Never   Smokeless tobacco: Never  Vaping Use   Vaping Use: Never used  Substance and Sexual Activity   Alcohol use: Not Currently   Drug use: Never   Sexual activity: Not on file  Other Topics Concern   Not on file  Social History Narrative   Not on file    Social Determinants of Health   Financial Resource Strain: Not on file  Food Insecurity: Not on file  Transportation Needs: Not on file  Physical Activity: Not on file  Stress: Not on file  Social Connections: Not on file  Intimate Partner Violence: Not on file    FAMILY HISTORY: No family history on file.  ALLERGIES:  is allergic to gazyva [obinutuzumab] and rituximab.  MEDICATIONS:  Current Outpatient Medications  Medication Sig Dispense Refill   acyclovir (ZOVIRAX) 400 MG tablet Take 1 tablet (400 mg total) by mouth daily. (Patient not taking: Reported on 08/13/2019) 30 tablet 3   allopurinol (ZYLOPRIM) 100 MG tablet TAKE 1 TABLET BY MOUTH TWICE A DAY 180 tablet 1   dexamethasone (DECADRON) 4 MG tablet Take 2 tablets (8 mg total) by mouth daily. Start the day after bendamustine chemotherapy for 2 days. Take with food. 30 tablet 1   fenofibrate micronized (LOFIBRA) 67 MG capsule Take 67 mg by mouth daily before breakfast.     lisinopril (PRINIVIL,ZESTRIL) 10 MG tablet Take 10 mg by mouth daily.     ondansetron (ZOFRAN-ODT) 4 MG disintegrating tablet 45m ODT q4 hours prn nausea/vomit 10 tablet 0   sertraline (ZOLOFT) 100 MG tablet Take 100 mg by mouth daily.     venetoclax (VENCLEXTA) 100 MG tablet Take 2 tablets (200 mg total) by mouth daily. Tablets should be swallowed whole with a meal and a full glass of  water. 60 tablet 1   VITAMIN D PO Take 2,000 Units by mouth.     No current facility-administered medications for this visit.    REVIEW OF SYSTEMS:   10 Point review of Systems was done is negative except as noted above.  PHYSICAL EXAMINATION: .BP 111/70   Pulse 99   Temp 98.6 F (37 C)   Resp 20   Wt 294 lb 6.4 oz (133.5 kg)   SpO2 97%   BMI 44.76 kg/m  NAD GENERAL:alert, in no acute distress and comfortable SKIN: no acute rashes, no significant lesions EYES: conjunctiva are pink and non-injected, sclera anicteric NECK: supple, no JVD LYMPH:  no palpable  lymphadenopathy in the cervical, axillary or inguinal regions LUNGS: clear to auscultation b/l with normal respiratory effort HEART: regular rate & rhythm ABDOMEN:  normoactive bowel sounds , non tender, not distended. Extremity: no pedal edema PSYCH: alert & oriented x 3 with fluent speech NEURO: no focal motor/sensory deficits  LABORATORY DATA:   .    Latest Ref Rng & Units 01/24/2022   10:06 AM 10/27/2021    9:01 AM 09/29/2021    9:59 AM  CBC  WBC 4.0 - 10.5 K/uL 2.2  4.3  3.5   Hemoglobin 13.0 - 17.0 g/dL 15.1  15.3  15.2   Hematocrit 39.0 - 52.0 % 43.2  44.7  42.3   Platelets 150 - 400 K/uL 205  232  208    ANC 800      Latest Ref Rng & Units 01/24/2022   10:06 AM 10/27/2021    9:01 AM 09/29/2021    9:59 AM  CMP  Glucose 70 - 99 mg/dL 146  105  94   BUN 6 - 20 mg/dL '12  13  17   ' Creatinine 0.61 - 1.24 mg/dL 0.84  0.79  0.82   Sodium 135 - 145 mmol/L 139  138  137   Potassium 3.5 - 5.1 mmol/L 4.0  4.1  4.0   Chloride 98 - 111 mmol/L 106  107  107   CO2 22 - 32 mmol/L '26  25  24   ' Calcium 8.9 - 10.3 mg/dL 9.7  9.3  9.3   Total Protein 6.5 - 8.1 g/dL 6.2  6.3  6.5   Total Bilirubin 0.3 - 1.2 mg/dL 0.5  0.5  0.6   Alkaline Phos 38 - 126 U/L 88  67  66   AST 15 - 41 U/L '24  28  27   ' ALT 0 - 44 U/L 33  37  32    . Lab Results  Component Value Date   LDH 160 01/24/2022   Uric acid 3.8    Component     Latest Ref Rng & Units 10/30/2017  HIV Screen 4th Generation wRfx     Non Reactive Non Reactive  HCV Ab     0.0 - 0.9 s/co ratio <0.1  Hepatitis B Surface Ag     Negative Negative  Hep B Core Ab, Tot     Negative Negative      10/25/17 Tissue Flow Cytometry:    10/25/17 Needle/core Bx:   [facility] Oncology  Order: [id number]  Status:  Final result   Visible to patient:  No (Not Released) Next appt:  11/07/2017 at 09:00 AM in Radiology ([facility]) Dx:  Lymphocytosis  Component 18moago  Specimen Type Comment:   Comment: BLOOD  Cells Counted 200   Cells  Analyzed 200   FISH Result Comment:  Comment: NORMAL:  NO CYCLIN D1 OR IGH GENE REARRANGEMENT OBSERVED  Interpretation Comment:   Comment: (NOTE)               nuc ish 11q13(CCND1x2),14q32(IGHx2)[200]       The fluorescence in situ hybridization (FISH) result  was normal. Dual color dual fusion DNA FISH probes targeting  the cyclin D1 gene (CCND1 or BCL1)([employer] and the  heavy chain immunoglobulin gene (IgH) at 14q32 showed two  hybridization signals each with no fusion in all cells  examined. Thus, there was NO apparent rearrangement of the  primary genes involved with mantle cell lymphoma, and to a  lesser extent myeloma.  Marland Kitchen         RADIOGRAPHIC STUDIES: I have personally reviewed the radiological images as listed and agreed with the findings in the report. No results found. CT chest abdomen pelvis 03/26/2021: IMPRESSION: 1. Interval development of bulky lymphadenopathy in the chest, abdomen, and pelvis. Imaging features compatible with recurrent lymphoma. 2. No hepatosplenomegaly. 3. Enlargement of the pulmonary outflow tract and main pulmonary arteries suggests pulmonary arterial hypertension. 4. 2-3 mm left upper lobe pulmonary nodule was largely obscured on the prior study. Most likely benign, attention on follow-up recommended.     Electronically Signed   By: Misty Stanley M.D.  CT neck 03/26/2021 IMPRESSION: 1. Limited direct comparison to prior PET-CT (no prior neck CTs) with possible slight increase in size of a few left submandibular nodes. Otherwise, similar increased number of lymph nodes throughout the neck without substantial change in multiple mildly enlarged nodes (detailed above). 2. Oval 2.1 x 1.3 cm mass in the posterior right nasal cavity, nonspecific but potentially a polyp or retention cyst. Recommend correlation with direct inspection.     ASSESSMENT & PLAN:   41 y.o. male with  1.  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 Noted 13q deletion: 13q14.3 is at 47.67% for normal nuclei, and 94.49% with mono allelic deletion of [id number].  -ATM for 11q22.3 revealed 81.67% with normal nuclei and 18.33% with positive nuclei for del 11q22.3   02/26/18 CT C/A/P revealed  Marked response to therapy as evidenced by decrease in size or resolution of adenopathy throughout chest, abdomen and pelvis. 2. Hepatic steatosis. Slight marginal irregularity suggests cirrhosis. 3. Enlarged pulmonary arteries, indicative of pulmonary arterial hypertension.   S/p 6 cycles of BR completed on 04/19/18 for first-line treatment  06/04/18 CT C/A/P revealed Stable CT chest without thoracic lymphadenopathy. Continued further decrease in abdominal and pelvic lymphadenopathy. No new or progressive lymphadenopathy on today's exam. 2. Hepatic steatosis. Subtle nodularity of liver contour raises the question of cirrhosis. 3. Pulmonary arterial enlargement. Pulmonary arterial hypertension a distinct concern.  2. H/o Rituxan infusion reaction-  -Had significant flushing with rituxan needing steroids. Not a candidate in future for rapid infusion protocol.  3.  History of some rigors and flushing with Gazyva controlled with adding Demerol and adjusting premedications.  4. Sleep apnea Notes he had a sleep study since his last clinic visit and diagnosis was confirmed. -uses CPAP  5. history of influenza A  6.  Mildly elevated transaminases less than 2 times upper limit of normal likely related to his recent influenza A and Tamiflu.  We will monitor-now resolved  PLAN: -Labs done today were discussed in detail with the patient CBC shows leucopenia/neutropenia ANC 800 CMP stable -No notable toxicity from his last cycle of Gazyva. -No notable toxicities  from venetoclax 200 mg p.o. daily -No evidence of CLL progression at this time. -We discussed his recent CT chest, abd, pelvis done 12/08/2021 which  revealed significant decrease in lymphadenopathy consistent with treatment response. Minor nodularity in liver. -He is recommended to maintain healthy lifestyle changes and continue f/u with PCP for weight management. -He has completed all 6 cycles of Gazyva. -He was counseled on neutropenic precautions. -We will increase Venetoclax dosage if neutropenia stable/resolves.   Follow-up Phone visit with Dr Irene Limbo in 4 weeks Labs 1 day prior to phone visit  The total time spent in the appointment was 20 minutes*.  All of the patient's questions were answered with apparent satisfaction. The patient knows to call the clinic with any problems, questions or concerns.   Sullivan Lone MD MS AAHIVMS [facility] Hematology/Oncology Physician [facility]  .*Total Encounter Time as defined by the Centers for Medicare and Medicaid Services includes, in addition to the face-to-face time of a patient visit (documented in the note above) non-face-to-face time: obtaining and reviewing outside history, ordering and reviewing medications, tests or procedures, care coordination (communications with other health care professionals or caregivers) and documentation in the medical record.  I, Melene Muller, am acting as scribe for Dr. Sullivan Lone, MD.  .I have reviewed the above documentation for accuracy and completeness, and I agree with the above.Brunetta Genera MD

## 2022-01-27 ENCOUNTER — Telehealth: Payer: Self-pay | Admitting: Hematology

## 2022-01-27 NOTE — Telephone Encounter (Signed)
Scheduled follow-up appointments per 7/24 los. Patient is aware.

## 2022-01-28 ENCOUNTER — Other Ambulatory Visit: Payer: Self-pay

## 2022-01-30 ENCOUNTER — Encounter: Payer: Self-pay | Admitting: Hematology

## 2022-02-01 ENCOUNTER — Other Ambulatory Visit: Payer: Self-pay

## 2022-02-15 ENCOUNTER — Other Ambulatory Visit (HOSPITAL_COMMUNITY): Payer: Self-pay

## 2022-02-15 ENCOUNTER — Other Ambulatory Visit: Payer: Self-pay | Admitting: Hematology

## 2022-02-15 MED ORDER — VENETOCLAX 100 MG PO TABS
200.0000 mg | ORAL_TABLET | Freq: Every day | ORAL | 1 refills | Status: DC
  Filled 2022-02-15: qty 60, 30d supply, fill #0
  Filled 2022-03-14: qty 60, 30d supply, fill #1

## 2022-02-21 ENCOUNTER — Inpatient Hospital Stay: Payer: No Typology Code available for payment source | Attending: Hematology

## 2022-02-21 ENCOUNTER — Other Ambulatory Visit: Payer: Self-pay

## 2022-02-21 DIAGNOSIS — C9110 Chronic lymphocytic leukemia of B-cell type not having achieved remission: Secondary | ICD-10-CM | POA: Insufficient documentation

## 2022-02-21 DIAGNOSIS — C9190 Lymphoid leukemia, unspecified not having achieved remission: Secondary | ICD-10-CM | POA: Diagnosis present

## 2022-02-21 LAB — CBC WITH DIFFERENTIAL (CANCER CENTER ONLY)
Abs Immature Granulocytes: 0.01 10*3/uL (ref 0.00–0.07)
Basophils Absolute: 0 10*3/uL (ref 0.0–0.1)
Basophils Relative: 1 %
Eosinophils Absolute: 0.1 10*3/uL (ref 0.0–0.5)
Eosinophils Relative: 3 %
HCT: 42.9 % (ref 39.0–52.0)
Hemoglobin: 15.1 g/dL (ref 13.0–17.0)
Immature Granulocytes: 0 %
Lymphocytes Relative: 35 %
Lymphs Abs: 1 10*3/uL (ref 0.7–4.0)
MCH: 31.3 pg (ref 26.0–34.0)
MCHC: 35.2 g/dL (ref 30.0–36.0)
MCV: 88.8 fL (ref 80.0–100.0)
Monocytes Absolute: 0.8 10*3/uL (ref 0.1–1.0)
Monocytes Relative: 27 %
Neutro Abs: 1 10*3/uL — ABNORMAL LOW (ref 1.7–7.7)
Neutrophils Relative %: 34 %
Platelet Count: 187 10*3/uL (ref 150–400)
RBC: 4.83 MIL/uL (ref 4.22–5.81)
RDW: 12.3 % (ref 11.5–15.5)
WBC Count: 2.9 10*3/uL — ABNORMAL LOW (ref 4.0–10.5)
nRBC: 0 % (ref 0.0–0.2)

## 2022-02-21 LAB — CMP (CANCER CENTER ONLY)
ALT: 32 U/L (ref 0–44)
AST: 24 U/L (ref 15–41)
Albumin: 4.5 g/dL (ref 3.5–5.0)
Alkaline Phosphatase: 82 U/L (ref 38–126)
Anion gap: 4 — ABNORMAL LOW (ref 5–15)
BUN: 13 mg/dL (ref 6–20)
CO2: 28 mmol/L (ref 22–32)
Calcium: 9.6 mg/dL (ref 8.9–10.3)
Chloride: 108 mmol/L (ref 98–111)
Creatinine: 0.83 mg/dL (ref 0.61–1.24)
GFR, Estimated: >60 mL/min (ref >60)
Glucose, Bld: 102 mg/dL — ABNORMAL HIGH (ref 70–99)
Potassium: 4.4 mmol/L (ref 3.5–5.1)
Sodium: 140 mmol/L (ref 135–145)
Total Bilirubin: 0.5 mg/dL (ref 0.3–1.2)
Total Protein: 6.2 g/dL — ABNORMAL LOW (ref 6.5–8.1)

## 2022-02-21 LAB — LACTATE DEHYDROGENASE: LDH: 134 U/L (ref 98–192)

## 2022-02-22 ENCOUNTER — Other Ambulatory Visit: Payer: Self-pay | Admitting: Hematology

## 2022-02-22 ENCOUNTER — Other Ambulatory Visit (HOSPITAL_COMMUNITY): Payer: Self-pay

## 2022-02-22 ENCOUNTER — Inpatient Hospital Stay (HOSPITAL_BASED_OUTPATIENT_CLINIC_OR_DEPARTMENT_OTHER): Payer: No Typology Code available for payment source | Admitting: Hematology

## 2022-02-22 DIAGNOSIS — C9110 Chronic lymphocytic leukemia of B-cell type not having achieved remission: Secondary | ICD-10-CM

## 2022-02-22 NOTE — Progress Notes (Signed)
HEMATOLOGY/ONCOLOGY PHONE VISIT NOTE  Date of Service: 02/22/2022  Patient Care Team: [name], MD as PCP - General (Family Medicine)  CHIEF COMPLAINTS/PURPOSE OF CONSULTATION:   follow-up for continued evaluation and management of CLL  HISTORY OF PRESENTING ILLNESS:  See previous note for details on initial presentation.  Interval History:  I connected with [name] on 02/22/2022 at 3:30 PM EST by telephone visit and verified that I am speaking with the correct person using two identifiers.   I discussed the limitations, risks, security and privacy concerns of performing an evaluation and management service by telemedicine and the availability of in-person appointments. I also discussed with the patient that there may be a patient responsible charge related to this service. The patient expressed understanding and agreed to proceed.   Other persons participating in the visit and their role in the encounter: None  Patient's location: Home Provider's location: [facility]  Jordan Schroeder is a 41 y.o. male was contacted via phone for continued evaluation and management of his CLL. He reports He is doing well with no new symptoms or concerns.  Notes grade 1 diarrhea. Well managed and not bothersome at this time.  No fever, chills, night sweats. No new infection issues. No other new or acute focal symptoms.  Tolerating his venetoclax 200 mg p.o. daily without any issues. He notes he has been taking 100 mg in the morning and 100 mg in the evening and we discussed that that the 200 mg should be taken 1x per day to avoid bone marrow suppression. We discussed that we will continue 200 mg 1x p.o daily and reassess in 2 months for possible dose escalation and continued CLL management.  Labs done 02/21/2022 stable and were reviewed with the patient in detail.  MEDICAL HISTORY:  Past Medical History:  Diagnosis Date   High cholesterol    Hypertension     Leukemia ([place])     SURGICAL HISTORY: No past surgical history on file.  SOCIAL HISTORY: Social History   Socioeconomic History   Marital status: Married    Spouse name: Not on file   Number of children: Not on file   Years of education: Not on file   Highest education level: Not on file  Occupational History   Not on file  Tobacco Use   Smoking status: Never   Smokeless tobacco: Never  Vaping Use   Vaping Use: Never used  Substance and Sexual Activity   Alcohol use: Not Currently   Drug use: Never   Sexual activity: Not on file  Other Topics Concern   Not on file  Social History Narrative   Not on file   Social Determinants of Health   Financial Resource Strain: Not on file  Food Insecurity: Not on file  Transportation Needs: Not on file  Physical Activity: Not on file  Stress: Not on file  Social Connections: Not on file  Intimate Partner Violence: Not on file    FAMILY HISTORY: No family history on file.  ALLERGIES:  is allergic to gazyva [obinutuzumab] and rituximab.  MEDICATIONS:  Current Outpatient Medications  Medication Sig Dispense Refill   acyclovir (ZOVIRAX) 400 MG tablet Take 1 tablet (400 mg total) by mouth daily. (Patient not taking: Reported on 08/13/2019) 30 tablet 3   allopurinol (ZYLOPRIM) 100 MG tablet TAKE 1 TABLET BY MOUTH TWICE A DAY 180 tablet 1   fenofibrate micronized (LOFIBRA) 67 MG capsule Take 67 mg by mouth daily before  breakfast.     lisinopril (PRINIVIL,ZESTRIL) 10 MG tablet Take 10 mg by mouth daily.     ondansetron (ZOFRAN-ODT) 4 MG disintegrating tablet 14m ODT q4 hours prn nausea/vomit 10 tablet 0   sertraline (ZOLOFT) 100 MG tablet Take 100 mg by mouth daily.     venetoclax (VENCLEXTA) 100 MG tablet Take 2 tablets (200 mg total) by mouth daily. Tablets should be swallowed whole with a meal and a full glass of water. 60 tablet 1   VITAMIN D PO Take 2,000 Units by mouth.     No current facility-administered medications for this  visit.    REVIEW OF SYSTEMS:   10 Point review of Systems was done is negative except as noted above.  PHYSICAL EXAMINATION: Telemedicine appointment  LABORATORY DATA:   .    Latest Ref Rng & Units 02/21/2022   10:05 AM 01/24/2022   10:06 AM 10/27/2021    9:01 AM  CBC  WBC 4.0 - 10.5 K/uL 2.9  2.2  4.3   Hemoglobin 13.0 - 17.0 g/dL 15.1  15.1  15.3   Hematocrit 39.0 - 52.0 % 42.9  43.2  44.7   Platelets 150 - 400 K/uL 187  205  232    ANC 1000      Latest Ref Rng & Units 02/21/2022   10:05 AM 01/24/2022   10:06 AM 10/27/2021    9:01 AM  CMP  Glucose 70 - 99 mg/dL 102  146  105   BUN 6 - 20 mg/dL '13  12  13   ' Creatinine 0.61 - 1.24 mg/dL 0.83  0.84  0.79   Sodium 135 - 145 mmol/L 140  139  138   Potassium 3.5 - 5.1 mmol/L 4.4  4.0  4.1   Chloride 98 - 111 mmol/L 108  106  107   CO2 22 - 32 mmol/L '28  26  25   ' Calcium 8.9 - 10.3 mg/dL 9.6  9.7  9.3   Total Protein 6.5 - 8.1 g/dL 6.2  6.2  6.3   Total Bilirubin 0.3 - 1.2 mg/dL 0.5  0.5  0.5   Alkaline Phos 38 - 126 U/L 82  88  67   AST 15 - 41 U/L '24  24  28   ' ALT 0 - 44 U/L 32  33  37    . Lab Results  Component Value Date   LDH 134 02/21/2022   08/25/2021 Uric acid 3.8    Component     Latest Ref Rng & Units 10/30/2017  HIV Screen 4th Generation wRfx     Non Reactive Non Reactive  HCV Ab     0.0 - 0.9 s/co ratio <0.1  Hepatitis B Surface Ag     Negative Negative  Hep B Core Ab, Tot     Negative Negative      10/25/17 Tissue Flow Cytometry:    10/25/17 Needle/core Bx:   [facility] Oncology  Order: [id number] Status:  Final result   Visible to patient:  No (Not Released) Next appt:  11/07/2017 at 09:00 AM in Radiology ([facility]) Dx:  Lymphocytosis  Component 147mogo  Specimen Type Comment:   Comment: BLOOD  Cells Counted 200   Cells Analyzed 200   FISH Result Comment:   Comment: NORMAL:  NO CYCLIN D1 OR IGH GENE REARRANGEMENT OBSERVED  Interpretation Comment:   Comment: (NOTE)               nuc  ish 11q13(CCND1x2),14q32(IGHx2)[200]  The fluorescence in situ hybridization (FISH) result  was normal. Dual color dual fusion DNA FISH probes targeting  the cyclin D1 gene (CCND1 or BCL1)([employer] and the  heavy chain immunoglobulin gene (IgH) at 14q32 showed two  hybridization signals each with no fusion in all cells  examined. Thus, there was NO apparent rearrangement of the  primary genes involved with mantle cell lymphoma, and to a  lesser extent myeloma.  Marland Kitchen         RADIOGRAPHIC STUDIES: I have personally reviewed the radiological images as listed and agreed with the findings in the report. No results found. CT chest abdomen pelvis 03/26/2021: IMPRESSION: 1. Interval development of bulky lymphadenopathy in the chest, abdomen, and pelvis. Imaging features compatible with recurrent lymphoma. 2. No hepatosplenomegaly. 3. Enlargement of the pulmonary outflow tract and main pulmonary arteries suggests pulmonary arterial hypertension. 4. 2-3 mm left upper lobe pulmonary nodule was largely obscured on the prior study. Most likely benign, attention on follow-up recommended.     Electronically Signed   By: Misty Stanley M.D.  CT neck 03/26/2021 IMPRESSION: 1. Limited direct comparison to prior PET-CT (no prior neck CTs) with possible slight increase in size of a few left submandibular nodes. Otherwise, similar increased number of lymph nodes throughout the neck without substantial change in multiple mildly enlarged nodes (detailed above). 2. Oval 2.1 x 1.3 cm mass in the posterior right nasal cavity, nonspecific but potentially a polyp or retention cyst. Recommend correlation with direct inspection.     ASSESSMENT & PLAN:   40 y.o. male with  1.  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 Noted 13q deletion: 13q14.3 is at 47.67% for normal nuclei, and 34.19% with mono allelic deletion of [id number].  -ATM  for 11q22.3 revealed 81.67% with normal nuclei and 18.33% with positive nuclei for del 11q22.3   02/26/18 CT C/A/P revealed  Marked response to therapy as evidenced by decrease in size or resolution of adenopathy throughout chest, abdomen and pelvis. 2. Hepatic steatosis. Slight marginal irregularity suggests cirrhosis. 3. Enlarged pulmonary arteries, indicative of pulmonary arterial hypertension.   S/p 6 cycles of BR completed on 04/19/18 for first-line treatment  06/04/18 CT C/A/P revealed Stable CT chest without thoracic lymphadenopathy. Continued further decrease in abdominal and pelvic lymphadenopathy. No new or progressive lymphadenopathy on today's exam. 2. Hepatic steatosis. Subtle nodularity of liver contour raises the question of cirrhosis. 3. Pulmonary arterial enlargement. Pulmonary arterial hypertension a distinct concern.  2. H/o Rituxan infusion reaction-  -Had significant flushing with rituxan needing steroids. Not a candidate in future for rapid infusion protocol.  3.  History of some rigors and flushing with Gazyva controlled with adding Demerol and adjusting premedications.  4. Sleep apnea Notes he had a sleep study since his last clinic visit and diagnosis was confirmed. -uses CPAP  5. history of influenza A  6.  Mildly elevated transaminases less than 2 times upper limit of normal likely related to his recent influenza A and Tamiflu.  We will monitor-now resolved  PLAN: -Labs done 02/21/2022 were discussed in detail with the patient CBC shows leucopenia/neutropenia ANC 1000 CMP stable LDH is 134 -No notable toxicities from venetoclax 200 mg p.o. daily -No evidence of CLL progression at this time. -He is recommended to maintain healthy lifestyle changes and continue f/u with PCP for weight management. -We will increase Venetoclax dosage if neutropenia stable/resolves. -we discussed that that the 200 mg should be  taken 1x per day to avoid bone marrow suppression.  -We  discussed that we will continue 200 mg 1x p.o daily and reassess in 2 months for possible dose escalation and continued CLL management.   Follow-up RTC with Dr Irene Limbo with labs in 2 months  The total time spent in the appointment was 10 minutes*.  All of the patient's questions were answered with apparent satisfaction. The patient knows to call the clinic with any problems, questions or concerns.   Sullivan Lone MD MS AAHIVMS [facility] Hematology/Oncology Physician [facility]  .*Total Encounter Time as defined by the Centers for Medicare and Medicaid Services includes, in addition to the face-to-face time of a patient visit (documented in the note above) non-face-to-face time: obtaining and reviewing outside history, ordering and reviewing medications, tests or procedures, care coordination (communications with other health care professionals or caregivers) and documentation in the medical record.  I, Melene Muller, am acting as scribe for Dr. Sullivan Lone, MD.  I have reviewed the above documentation for accuracy and completeness, and I agree with the above.Brunetta Genera MD

## 2022-02-24 ENCOUNTER — Telehealth: Payer: Self-pay | Admitting: Hematology

## 2022-02-24 NOTE — Telephone Encounter (Signed)
Scheduled follow-up appointment per 8/22 los. Patient is aware.

## 2022-02-25 ENCOUNTER — Other Ambulatory Visit: Payer: Self-pay

## 2022-02-28 ENCOUNTER — Encounter: Payer: Self-pay | Admitting: Hematology

## 2022-03-04 ENCOUNTER — Other Ambulatory Visit: Payer: Self-pay

## 2022-03-14 ENCOUNTER — Other Ambulatory Visit (HOSPITAL_COMMUNITY): Payer: Self-pay

## 2022-03-23 ENCOUNTER — Other Ambulatory Visit (HOSPITAL_COMMUNITY): Payer: Self-pay

## 2022-03-25 ENCOUNTER — Encounter: Payer: Self-pay | Admitting: Hematology

## 2022-04-06 ENCOUNTER — Other Ambulatory Visit: Payer: Self-pay | Admitting: Hematology

## 2022-04-08 ENCOUNTER — Ambulatory Visit
Admission: RE | Admit: 2022-04-08 | Discharge: 2022-04-08 | Disposition: A | Discharge status: Home / Self Care | Payer: No Typology Code available for payment source | Source: Ambulatory Visit | Attending: Nurse Practitioner | Admitting: Nurse Practitioner

## 2022-04-08 ENCOUNTER — Other Ambulatory Visit: Payer: Self-pay | Admitting: Nurse Practitioner

## 2022-04-08 ENCOUNTER — Other Ambulatory Visit: Payer: Self-pay | Admitting: Family Medicine

## 2022-04-08 DIAGNOSIS — R053 Chronic cough: Secondary | ICD-10-CM

## 2022-04-11 ENCOUNTER — Other Ambulatory Visit (HOSPITAL_COMMUNITY): Payer: Self-pay

## 2022-04-11 ENCOUNTER — Other Ambulatory Visit: Payer: Self-pay | Admitting: Hematology

## 2022-04-11 MED ORDER — VENETOCLAX 100 MG PO TABS
200.0000 mg | ORAL_TABLET | Freq: Every day | ORAL | 1 refills | Status: DC
  Filled 2022-04-11: qty 60, 30d supply, fill #0

## 2022-04-18 ENCOUNTER — Other Ambulatory Visit (HOSPITAL_COMMUNITY): Payer: Self-pay

## 2022-04-25 ENCOUNTER — Inpatient Hospital Stay: Payer: No Typology Code available for payment source | Attending: Hematology | Admitting: Hematology

## 2022-04-25 ENCOUNTER — Other Ambulatory Visit (HOSPITAL_COMMUNITY): Payer: Self-pay

## 2022-04-25 ENCOUNTER — Inpatient Hospital Stay: Payer: No Typology Code available for payment source

## 2022-04-25 VITALS — BP 108/81 | HR 116 | Temp 97.7°F | Resp 19 | Ht 68.0 in | Wt 278.4 lb

## 2022-04-25 DIAGNOSIS — C9110 Chronic lymphocytic leukemia of B-cell type not having achieved remission: Secondary | ICD-10-CM

## 2022-04-25 DIAGNOSIS — I10 Essential (primary) hypertension: Secondary | ICD-10-CM | POA: Insufficient documentation

## 2022-04-25 DIAGNOSIS — R7401 Elevation of levels of liver transaminase levels: Secondary | ICD-10-CM | POA: Insufficient documentation

## 2022-04-25 DIAGNOSIS — G4730 Sleep apnea, unspecified: Secondary | ICD-10-CM | POA: Insufficient documentation

## 2022-04-25 DIAGNOSIS — C9112 Chronic lymphocytic leukemia of B-cell type in relapse: Secondary | ICD-10-CM | POA: Insufficient documentation

## 2022-04-25 LAB — CMP (CANCER CENTER ONLY)
ALT: 43 U/L (ref 0–44)
AST: 29 U/L (ref 15–41)
Albumin: 4.7 g/dL (ref 3.5–5.0)
Alkaline Phosphatase: 75 U/L (ref 38–126)
Anion gap: 7 (ref 5–15)
BUN: 13 mg/dL (ref 6–20)
CO2: 28 mmol/L (ref 22–32)
Calcium: 10 mg/dL (ref 8.9–10.3)
Chloride: 101 mmol/L (ref 98–111)
Creatinine: 1.11 mg/dL (ref 0.61–1.24)
GFR, Estimated: >60 mL/min (ref >60)
Glucose, Bld: 119 mg/dL — ABNORMAL HIGH (ref 70–99)
Potassium: 4.1 mmol/L (ref 3.5–5.1)
Sodium: 136 mmol/L (ref 135–145)
Total Bilirubin: 0.5 mg/dL (ref 0.3–1.2)
Total Protein: 6.8 g/dL (ref 6.5–8.1)

## 2022-04-25 LAB — CBC WITH DIFFERENTIAL (CANCER CENTER ONLY)
Abs Immature Granulocytes: 0.03 10*3/uL (ref 0.00–0.07)
Basophils Absolute: 0 10*3/uL (ref 0.0–0.1)
Basophils Relative: 0 %
Eosinophils Absolute: 0 10*3/uL (ref 0.0–0.5)
Eosinophils Relative: 0 %
HCT: 47.3 % (ref 39.0–52.0)
Hemoglobin: 16.3 g/dL (ref 13.0–17.0)
Immature Granulocytes: 0 %
Lymphocytes Relative: 16 %
Lymphs Abs: 1.2 10*3/uL (ref 0.7–4.0)
MCH: 31.1 pg (ref 26.0–34.0)
MCHC: 34.5 g/dL (ref 30.0–36.0)
MCV: 90.3 fL (ref 80.0–100.0)
Monocytes Absolute: 1 10*3/uL (ref 0.1–1.0)
Monocytes Relative: 13 %
Neutro Abs: 5.2 10*3/uL (ref 1.7–7.7)
Neutrophils Relative %: 71 %
Platelet Count: 217 10*3/uL (ref 150–400)
RBC: 5.24 MIL/uL (ref 4.22–5.81)
RDW: 13 % (ref 11.5–15.5)
WBC Count: 7.5 10*3/uL (ref 4.0–10.5)
nRBC: 0 % (ref 0.0–0.2)

## 2022-04-25 LAB — LACTATE DEHYDROGENASE: LDH: 175 U/L (ref 98–192)

## 2022-04-25 MED ORDER — VENETOCLAX 100 MG PO TABS
300.0000 mg | ORAL_TABLET | Freq: Every day | ORAL | 1 refills | Status: DC
  Filled 2022-04-25 – 2022-05-05 (×2): qty 90, 30d supply, fill #0
  Filled 2022-06-07: qty 90, 30d supply, fill #1

## 2022-04-25 MED ORDER — AZITHROMYCIN 250 MG PO TABS: ORAL_TABLET | ORAL | 0 refills | Status: AC

## 2022-04-25 NOTE — Progress Notes (Signed)
HEMATOLOGY/ONCOLOGY CLINIC VISIT NOTE  Date of Service: 04/25/2022  Patient Care Team: [name], MD as PCP - General (Family Medicine)  CHIEF COMPLAINTS/PURPOSE OF CONSULTATION:   follow-up for continued evaluation and management of CLL  HISTORY OF PRESENTING ILLNESS:  See previous note for details on initial presentation.  Interval History:  Jordan Schroeder is a 41 y.o. male is here today for continued evaluation and management of his CLL.  I had phone visit with the patient on 02/22/2022 and was doing well with new symptoms or concerns.   He reports he has been doing well since the last visit. He has started working full-time back. He denies fever, chills, abdominal pain, leg swelling, lumps, rashes, abnormal bowl moment, and urine problems.   He denies toxicities with his Venclexta.  He notes he had bronchitis and he was prescribed steroids, not antibiotics. He reports he has been doing better. However, he still complains of ear pain, mild tiredness, and dry cough. He has steroid inhaler for congestion.   He reports he recently had his influenza vaccine and is interested in receiving his COVID-19 booster.    MEDICAL HISTORY:  Past Medical History:  Diagnosis Date   High cholesterol    Hypertension    Leukemia ([place])     SURGICAL HISTORY: No past surgical history on file.  SOCIAL HISTORY: Social History   Socioeconomic History   Marital status: Married    Spouse name: Not on file   Number of children: Not on file   Years of education: Not on file   Highest education level: Not on file  Occupational History   Not on file  Tobacco Use   Smoking status: Never   Smokeless tobacco: Never  Vaping Use   Vaping Use: Never used  Substance and Sexual Activity   Alcohol use: Not Currently   Drug use: Never   Sexual activity: Not on file  Other Topics Concern   Not on file  Social History Narrative   Not on file   Social Determinants of Health    Financial Resource Strain: Not on file  Food Insecurity: Not on file  Transportation Needs: Not on file  Physical Activity: Not on file  Stress: Not on file  Social Connections: Not on file  Intimate Partner Violence: Not on file    FAMILY HISTORY: No family history on file.  ALLERGIES:  is allergic to gazyva [obinutuzumab] and rituximab.  MEDICATIONS:  Current Outpatient Medications  Medication Sig Dispense Refill   acyclovir (ZOVIRAX) 400 MG tablet Take 1 tablet (400 mg total) by mouth daily. (Patient not taking: Reported on 08/13/2019) 30 tablet 3   allopurinol (ZYLOPRIM) 100 MG tablet TAKE 1 TABLET BY MOUTH TWICE A DAY 180 tablet 1   fenofibrate micronized (LOFIBRA) 67 MG capsule Take 67 mg by mouth daily before breakfast.     lisinopril (PRINIVIL,ZESTRIL) 10 MG tablet Take 10 mg by mouth daily.     ondansetron (ZOFRAN-ODT) 4 MG disintegrating tablet 19m ODT q4 hours prn nausea/vomit 10 tablet 0   sertraline (ZOLOFT) 100 MG tablet Take 100 mg by mouth daily.     venetoclax (VENCLEXTA) 100 MG tablet Take 2 tablets (200 mg total) by mouth daily. Tablets should be swallowed whole with a meal and a full glass of water. 60 tablet 1   VITAMIN D PO Take 2,000 Units by mouth.     No current facility-administered medications for this visit.    REVIEW OF SYSTEMS:  10 Point review of Systems was done is negative except as noted above.  PHYSICAL EXAMINATION: .BP 108/81 (BP Location: Left Arm, Patient Position: Sitting)   Pulse (!) 116 Comment: nurse notified  Temp 97.7 F (36.5 C) (Temporal)   Resp 19   Ht _0  (1.727 m)   Wt 278 lb 6.4 oz (126.3 kg)   SpO2 96%   BMI 42.33 kg/m  . GENERAL:alert, in no acute distress and comfortable SKIN: no acute rashes, no significant lesions EYES: conjunctiva are pink and non-injected, sclera anicteric OROPHARYNX: MMM, no exudates, no oropharyngeal erythema or ulceration NECK: supple, no JVD LYMPH:  no palpable lymphadenopathy in the  cervical, axillary or inguinal regions LUNGS: clear to auscultation b/l with normal respiratory effort HEART: regular rate & rhythm ABDOMEN:  normoactive bowel sounds , non tender, not distended. Extremity: no pedal edema PSYCH: alert & oriented x 3 with fluent speech NEURO: no focal motor/sensory deficits  LABORATORY DATA:   .    Latest Ref Rng & Units 04/25/2022   12:54 PM 02/21/2022   10:05 AM 01/24/2022   10:06 AM  CBC  WBC 4.0 - 10.5 K/uL 7.5  2.9  2.2   Hemoglobin 13.0 - 17.0 g/dL 16.3  15.1  15.1   Hematocrit 39.0 - 52.0 % 47.3  42.9  43.2   Platelets 150 - 400 K/uL 217  187  205    ANC 1000      Latest Ref Rng & Units 04/25/2022   12:54 PM 02/21/2022   10:05 AM 01/24/2022   10:06 AM  CMP  Glucose 70 - 99 mg/dL 119  102  146   BUN 6 - 20 mg/dL _1 Creatinine 0.61 - 1.24 mg/dL 1.11  0.83  0.84   Sodium 135 - 145 mmol/L 136  140  139   Potassium 3.5 - 5.1 mmol/L 4.1  4.4  4.0   Chloride 98 - 111 mmol/L 101  108  106   CO2 22 - 32 mmol/L _2 Calcium 8.9 - 10.3 mg/dL 10.0  9.6  9.7   Total Protein 6.5 - 8.1 g/dL 6.8  6.2  6.2   Total Bilirubin 0.3 - 1.2 mg/dL 0.5  0.5  0.5   Alkaline Phos 38 - 126 U/L 75  82  88   AST 15 - 41 U/L _3 ALT 0 - 44 U/L 43  32  33    . Lab Results  Component Value Date   LDH 175 04/25/2022   08/25/2021 Uric acid 3.8    Component     Latest Ref Rng & Units 10/30/2017  HIV Screen 4th Generation wRfx     Non Reactive Non Reactive  HCV Ab     0.0 - 0.9 s/co ratio <0.1  Hepatitis B Surface Ag     Negative Negative  Hep B Core Ab, Tot     Negative Negative      10/25/17 Tissue Flow Cytometry:    10/25/17 Needle/core Bx:   FISH Oncology  Order: [id number]  Status:  Final result   Visible to patient:  No (Not Released) Next appt:  11/07/2017 at 09:00 AM in Radiology ([facility]) Dx:  Lymphocytosis  Component 16moago  Specimen Type Comment:   Comment: BLOOD  Cells Counted 200   Cells Analyzed  200   FISH Result Comment:   Comment: NORMAL:  NO CYCLIN D1 OR  IGH GENE REARRANGEMENT OBSERVED  Interpretation Comment:   Comment: (NOTE)               nuc ish 11q13(CCND1x2),14q32(IGHx2)[200]       The fluorescence in situ hybridization (FISH) result  was normal. Dual color dual fusion DNA FISH probes targeting  the cyclin D1 gene (CCND1 or BCL1)([employer] and the  heavy chain immunoglobulin gene (IgH) at 14q32 showed two  hybridization signals each with no fusion in all cells  examined. Thus, there was NO apparent rearrangement of the  primary genes involved with mantle cell lymphoma, and to a  lesser extent myeloma.  Marland Kitchen         RADIOGRAPHIC STUDIES: I have personally reviewed the radiological images as listed and agreed with the findings in the report. DG Chest 2 View  Result Date: 04/09/2022 CLINICAL DATA:  Chronic cough. EXAM: CHEST - 2 VIEW COMPARISON:  06/05/2021 FINDINGS: The heart size and mediastinal contours are within normal limits. Both lungs are clear. The visualized skeletal structures are unremarkable. IMPRESSION: No active cardiopulmonary disease. Electronically Signed   By: Marlaine Hind M.D.   On: 04/09/2022 18:01   CT chest abdomen pelvis 03/26/2021: IMPRESSION: 1. Interval development of bulky lymphadenopathy in the chest, abdomen, and pelvis. Imaging features compatible with recurrent lymphoma. 2. No hepatosplenomegaly. 3. Enlargement of the pulmonary outflow tract and main pulmonary arteries suggests pulmonary arterial hypertension. 4. 2-3 mm left upper lobe pulmonary nodule was largely obscured on the prior study. Most likely benign, attention on follow-up recommended.     Electronically Signed   By: Misty Stanley M.D.  CT neck 03/26/2021 IMPRESSION: 1. Limited direct comparison to prior PET-CT (no prior neck CTs) with possible slight increase in size of a few left submandibular nodes. Otherwise, similar increased number of lymph nodes throughout the  neck without substantial change in multiple mildly enlarged nodes (detailed above). 2. Oval 2.1 x 1.3 cm mass in the posterior right nasal cavity, nonspecific but potentially a polyp or retention cyst. Recommend correlation with direct inspection.     ASSESSMENT & PLAN:   41 y.o. male with  1.  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 Noted 13q deletion: 13q14.3 is at 47.67% for normal nuclei, and 72.53% with mono allelic deletion of G64Q034.  -ATM for 11q22.3 revealed 81.67% with normal nuclei and 18.33% with positive nuclei for del 11q22.3   02/26/18 CT C/A/P revealed  Marked response to therapy as evidenced by decrease in size or resolution of adenopathy throughout chest, abdomen and pelvis. 2. Hepatic steatosis. Slight marginal irregularity suggests cirrhosis. 3. Enlarged pulmonary arteries, indicative of pulmonary arterial hypertension.   S/p 6 cycles of BR completed on 04/19/18 for first-line treatment  06/04/18 CT C/A/P revealed Stable CT chest without thoracic lymphadenopathy. Continued further decrease in abdominal and pelvic lymphadenopathy. No new or progressive lymphadenopathy on today's exam. 2. Hepatic steatosis. Subtle nodularity of liver contour raises the question of cirrhosis. 3. Pulmonary arterial enlargement. Pulmonary arterial hypertension a distinct concern.  2. H/o Rituxan infusion reaction-  -Had significant flushing with rituxan needing steroids. Not a candidate in future for rapid infusion protocol.  3.  History of some rigors and flushing with Gazyva controlled with adding Demerol and adjusting premedications.  4. Sleep apnea Notes he had a sleep study since his last clinic visit and diagnosis was confirmed. -uses CPAP  5. history of influenza A  6.  Mildly elevated transaminases  less than 2 times upper limit of normal likely related to his recent influenza A and Tamiflu.  We will monitor-now  resolved  PLAN: -Labs done 04/25/2022 were discussed in detail with the patient. CBC and CMP are stable -No notable toxicities from venetoclax 200 mg p.o. daily -No evidence of CLL progression at this time. -We will increase Venetoclax dosage to 328mpo daily after his bronchitis has been cleared.     Follow-up: Phone visit with Dr KIrene Limboin 6 weeks Labs a day prior to phone visit  The total time spent in the appointment was 21 minutes* .  All of the patient's questions were answered with apparent satisfaction. The patient knows to call the clinic with any problems, questions or concerns.   IZettie Cooley am acting as a sEducation administratorfor GSullivan Lone MD  GSullivan LoneMD MMonmouth JunctionAAHIVMS SKindred Hospital Bay AreaCCorry Memorial HospitalHematology/Oncology Physician CStamford Asc LLC .*Total Encounter Time as defined by the Centers for Medicare and Medicaid Services includes, in addition to the face-to-face time of a patient visit (documented in the note above) non-face-to-face time: obtaining and reviewing outside history, ordering and reviewing medications, tests or procedures, care coordination (communications with other health care professionals or caregivers) and documentation in the medical record.

## 2022-04-27 ENCOUNTER — Other Ambulatory Visit: Payer: Self-pay

## 2022-04-30 ENCOUNTER — Ambulatory Visit (HOSPITAL_COMMUNITY): Payer: No Typology Code available for payment source

## 2022-05-01 ENCOUNTER — Encounter: Payer: Self-pay | Admitting: Hematology

## 2022-05-05 ENCOUNTER — Other Ambulatory Visit (HOSPITAL_COMMUNITY): Payer: Self-pay

## 2022-05-13 ENCOUNTER — Other Ambulatory Visit (HOSPITAL_COMMUNITY): Payer: Self-pay

## 2022-06-07 ENCOUNTER — Inpatient Hospital Stay: Payer: No Typology Code available for payment source | Attending: Hematology

## 2022-06-07 ENCOUNTER — Other Ambulatory Visit (HOSPITAL_COMMUNITY): Payer: Self-pay

## 2022-06-07 DIAGNOSIS — G4730 Sleep apnea, unspecified: Secondary | ICD-10-CM | POA: Diagnosis not present

## 2022-06-07 DIAGNOSIS — I10 Essential (primary) hypertension: Secondary | ICD-10-CM | POA: Diagnosis not present

## 2022-06-07 DIAGNOSIS — E7800 Pure hypercholesterolemia, unspecified: Secondary | ICD-10-CM | POA: Insufficient documentation

## 2022-06-07 DIAGNOSIS — Z79899 Other long term (current) drug therapy: Secondary | ICD-10-CM | POA: Insufficient documentation

## 2022-06-07 DIAGNOSIS — C9112 Chronic lymphocytic leukemia of B-cell type in relapse: Secondary | ICD-10-CM | POA: Diagnosis present

## 2022-06-07 DIAGNOSIS — R7401 Elevation of levels of liver transaminase levels: Secondary | ICD-10-CM | POA: Insufficient documentation

## 2022-06-07 DIAGNOSIS — C9110 Chronic lymphocytic leukemia of B-cell type not having achieved remission: Secondary | ICD-10-CM

## 2022-06-07 LAB — CMP (CANCER CENTER ONLY)
ALT: 26 U/L (ref 0–44)
AST: 23 U/L (ref 15–41)
Albumin: 4.3 g/dL (ref 3.5–5.0)
Alkaline Phosphatase: 71 U/L (ref 38–126)
Anion gap: 7 (ref 5–15)
BUN: 12 mg/dL (ref 6–20)
CO2: 27 mmol/L (ref 22–32)
Calcium: 9.6 mg/dL (ref 8.9–10.3)
Chloride: 103 mmol/L (ref 98–111)
Creatinine: 0.69 mg/dL (ref 0.61–1.24)
GFR, Estimated: >60 mL/min (ref >60)
Glucose, Bld: 96 mg/dL (ref 70–99)
Potassium: 3.6 mmol/L (ref 3.5–5.1)
Sodium: 137 mmol/L (ref 135–145)
Total Bilirubin: 0.7 mg/dL (ref 0.3–1.2)
Total Protein: 6.2 g/dL — ABNORMAL LOW (ref 6.5–8.1)

## 2022-06-07 LAB — CBC WITH DIFFERENTIAL (CANCER CENTER ONLY)
Abs Immature Granulocytes: 0.02 10*3/uL (ref 0.00–0.07)
Basophils Absolute: 0 10*3/uL (ref 0.0–0.1)
Basophils Relative: 1 %
Eosinophils Absolute: 0 10*3/uL (ref 0.0–0.5)
Eosinophils Relative: 0 %
HCT: 38.6 % — ABNORMAL LOW (ref 39.0–52.0)
Hemoglobin: 13.7 g/dL (ref 13.0–17.0)
Immature Granulocytes: 1 %
Lymphocytes Relative: 40 %
Lymphs Abs: 1.1 10*3/uL (ref 0.7–4.0)
MCH: 31 pg (ref 26.0–34.0)
MCHC: 35.5 g/dL (ref 30.0–36.0)
MCV: 87.3 fL (ref 80.0–100.0)
Monocytes Absolute: 0.8 10*3/uL (ref 0.1–1.0)
Monocytes Relative: 29 %
Neutro Abs: 0.8 10*3/uL — ABNORMAL LOW (ref 1.7–7.7)
Neutrophils Relative %: 29 %
Platelet Count: 209 10*3/uL (ref 150–400)
RBC: 4.42 MIL/uL (ref 4.22–5.81)
RDW: 13.2 % (ref 11.5–15.5)
WBC Count: 2.7 10*3/uL — ABNORMAL LOW (ref 4.0–10.5)
nRBC: 0 % (ref 0.0–0.2)

## 2022-06-07 LAB — LACTATE DEHYDROGENASE: LDH: 162 U/L (ref 98–192)

## 2022-06-08 ENCOUNTER — Inpatient Hospital Stay (HOSPITAL_BASED_OUTPATIENT_CLINIC_OR_DEPARTMENT_OTHER): Payer: No Typology Code available for payment source | Admitting: Hematology

## 2022-06-08 ENCOUNTER — Other Ambulatory Visit (HOSPITAL_COMMUNITY): Payer: Self-pay

## 2022-06-08 DIAGNOSIS — C9112 Chronic lymphocytic leukemia of B-cell type in relapse: Secondary | ICD-10-CM | POA: Diagnosis not present

## 2022-06-08 DIAGNOSIS — C9110 Chronic lymphocytic leukemia of B-cell type not having achieved remission: Secondary | ICD-10-CM | POA: Diagnosis not present

## 2022-06-08 MED ORDER — VENETOCLAX 100 MG PO TABS
300.0000 mg | ORAL_TABLET | Freq: Every day | ORAL | 1 refills | Status: DC
  Filled 2022-06-08: qty 90, 30d supply, fill #0
  Filled 2022-07-07: qty 90, 30d supply, fill #1

## 2022-06-08 NOTE — Progress Notes (Signed)
HEMATOLOGY/ONCOLOGY CLINIC VISIT NOTE  Date of Service: 06/08/2022  Patient Care Team: [name], MD as PCP - General (Family Medicine)  CHIEF COMPLAINTS/PURPOSE OF CONSULTATION:   follow-up for continued evaluation and management of CLL  HISTORY OF PRESENTING ILLNESS:  See previous note for details on initial presentation.  Interval History:  Jordan Schroeder is a 41 y.o. male is here today for continued evaluation and management of his CLL.  I connected with Jordan Schroeder on 04/25/22 at  8:40 AM EST by telephone visit and verified that I am speaking with the correct person using two identifiers.   At his last visit with me on 04/25/22 he complained of ear pain, mild tiredness, and dry cough.  Patient reports he is doing well overall without any new medical concerns since our oast visit. He notes he is following up with ENT Physician for his ear pain. Patient notes he is currently taking anti-histamine.   He denies any toxicities with Venetoclax 300 mg dosage.   Patient denies fevers, chills, night sweats, new infection issues, or body pain.   Patient has received the influenza vaccine and he COVID-19 Booster.   I discussed the limitations, risks, security and privacy concerns of performing an evaluation and management service by telemedicine and the availability of in-person appointments. I also discussed with the patient that there may be a patient responsible charge related to this service. The patient expressed understanding and agreed to proceed.   Other persons participating in the visit and their role in the encounter: none   Patient's location: home  Provider's location: [facility]  Chief Complaint: CLL    MEDICAL HISTORY:  Past Medical History:  Diagnosis Date   High cholesterol    Hypertension    Leukemia ([place])     SURGICAL HISTORY: No past surgical history on file.  SOCIAL HISTORY: Social History   Socioeconomic History   Marital status: Married     Spouse name: Not on file   Number of children: Not on file   Years of education: Not on file   Highest education level: Not on file  Occupational History   Not on file  Tobacco Use   Smoking status: Never   Smokeless tobacco: Never  Vaping Use   Vaping Use: Never used  Substance and Sexual Activity   Alcohol use: Not Currently   Drug use: Never   Sexual activity: Not on file  Other Topics Concern   Not on file  Social History Narrative   Not on file   Social Determinants of Health   Financial Resource Strain: Not on file  Food Insecurity: Not on file  Transportation Needs: Not on file  Physical Activity: Not on file  Stress: Not on file  Social Connections: Not on file  Intimate Partner Violence: Not on file    FAMILY HISTORY: No family history on file.  ALLERGIES:  is allergic to gazyva [obinutuzumab] and rituximab.  MEDICATIONS:  Current Outpatient Medications  Medication Sig Dispense Refill   acyclovir (ZOVIRAX) 400 MG tablet Take 1 tablet (400 mg total) by mouth daily. (Patient not taking: Reported on 08/13/2019) 30 tablet 3   allopurinol (ZYLOPRIM) 100 MG tablet TAKE 1 TABLET BY MOUTH TWICE A DAY 180 tablet 1   azithromycin (ZITHROMAX Z-PAK) 250 MG tablet 2 tabs (578m) on day 1 and then 1 tab (2569m PO daily for 4 days 6 each 0   fenofibrate micronized (LOFIBRA) 67 MG capsule Take 67 mg by mouth daily before  breakfast.     lisinopril (PRINIVIL,ZESTRIL) 10 MG tablet Take 10 mg by mouth daily.     ondansetron (ZOFRAN-ODT) 4 MG disintegrating tablet 19m ODT q4 hours prn nausea/vomit 10 tablet 0   sertraline (ZOLOFT) 100 MG tablet Take 100 mg by mouth daily.     venetoclax (VENCLEXTA) 100 MG tablet Take 3 tablets (300 mg total) by mouth daily. Tablets should be swallowed whole with a meal and a full glass of water. 90 tablet 1   VITAMIN D PO Take 2,000 Units by mouth.     No current facility-administered medications for this visit.    REVIEW OF SYSTEMS:   10  Point review of Systems was done is negative except as noted above.  PHYSICAL EXAMINATION: Telemedicine visit  LABORATORY DATA:   .    Latest Ref Rng & Units 06/07/2022    2:58 PM 04/25/2022   12:54 PM 02/21/2022   10:05 AM  CBC  WBC 4.0 - 10.5 K/uL 2.7  7.5  2.9   Hemoglobin 13.0 - 17.0 g/dL 13.7  16.3  15.1   Hematocrit 39.0 - 52.0 % 38.6  47.3  42.9   Platelets 150 - 400 K/uL 209  217  187    ANC 1000      Latest Ref Rng & Units 06/07/2022    2:58 PM 04/25/2022   12:54 PM 02/21/2022   10:05 AM  CMP  Glucose 70 - 99 mg/dL 96  119  102   BUN 6 - 20 mg/dL _0 Creatinine 0.61 - 1.24 mg/dL 0.69  1.11  0.83   Sodium 135 - 145 mmol/L 137  136  140   Potassium 3.5 - 5.1 mmol/L 3.6  4.1  4.4   Chloride 98 - 111 mmol/L 103  101  108   CO2 22 - 32 mmol/L _1 Calcium 8.9 - 10.3 mg/dL 9.6  10.0  9.6   Total Protein 6.5 - 8.1 g/dL 6.2  6.8  6.2   Total Bilirubin 0.3 - 1.2 mg/dL 0.7  0.5  0.5   Alkaline Phos 38 - 126 U/L 71  75  82   AST 15 - 41 U/L _2 ALT 0 - 44 U/L 26  43  32    . Lab Results  Component Value Date   LDH 162 06/07/2022   08/25/2021 Uric acid 3.8    Component     Latest Ref Rng & Units 10/30/2017  HIV Screen 4th Generation wRfx     Non Reactive Non Reactive  HCV Ab     0.0 - 0.9 s/co ratio <0.1  Hepatitis B Surface Ag     Negative Negative  Hep B Core Ab, Tot     Negative Negative      10/25/17 Tissue Flow Cytometry:    10/25/17 Needle/core Bx:   FISH Oncology  Order: [id number] Status:  Final result   Visible to patient:  No (Not Released) Next appt:  11/07/2017 at 09:00 AM in Radiology ([facility]) Dx:  Lymphocytosis  Component 121mogo  Specimen Type Comment:   Comment: BLOOD  Cells Counted 200   Cells Analyzed 200   FISH Result Comment:   Comment: NORMAL:  NO CYCLIN D1 OR IGH GENE REARRANGEMENT OBSERVED  Interpretation Comment:   Comment: (NOTE)               nuc ish 11q13(CCND1x2),14q32(IGHx2)[200]  The fluorescence in situ hybridization (FISH) result  was normal. Dual color dual fusion DNA FISH probes targeting  the cyclin D1 gene (CCND1 or BCL1)([employer] and the  heavy chain immunoglobulin gene (IgH) at 14q32 showed two  hybridization signals each with no fusion in all cells  examined. Thus, there was NO apparent rearrangement of the  primary genes involved with mantle cell lymphoma, and to a  lesser extent myeloma.  Marland Kitchen         RADIOGRAPHIC STUDIES: I have personally reviewed the radiological images as listed and agreed with the findings in the report. No results found. CT chest abdomen pelvis 03/26/2021: IMPRESSION: 1. Interval development of bulky lymphadenopathy in the chest, abdomen, and pelvis. Imaging features compatible with recurrent lymphoma. 2. No hepatosplenomegaly. 3. Enlargement of the pulmonary outflow tract and main pulmonary arteries suggests pulmonary arterial hypertension. 4. 2-3 mm left upper lobe pulmonary nodule was largely obscured on the prior study. Most likely benign, attention on follow-up recommended.     Electronically Signed   By: Misty Stanley M.D.  CT neck 03/26/2021 IMPRESSION: 1. Limited direct comparison to prior PET-CT (no prior neck CTs) with possible slight increase in size of a few left submandibular nodes. Otherwise, similar increased number of lymph nodes throughout the neck without substantial change in multiple mildly enlarged nodes (detailed above). 2. Oval 2.1 x 1.3 cm mass in the posterior right nasal cavity, nonspecific but potentially a polyp or retention cyst. Recommend correlation with direct inspection.     ASSESSMENT & PLAN:   41 y.o. male with  1.  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 Noted 13q deletion: 13q14.3 is at 47.67% for normal nuclei, and 59.29% with mono allelic deletion of W44Q286.  -ATM for 11q22.3 revealed 81.67% with normal  nuclei and 18.33% with positive nuclei for del 11q22.3   02/26/18 CT C/A/P revealed  Marked response to therapy as evidenced by decrease in size or resolution of adenopathy throughout chest, abdomen and pelvis. 2. Hepatic steatosis. Slight marginal irregularity suggests cirrhosis. 3. Enlarged pulmonary arteries, indicative of pulmonary arterial hypertension.   S/p 6 cycles of BR completed on 04/19/18 for first-line treatment  06/04/18 CT C/A/P revealed Stable CT chest without thoracic lymphadenopathy. Continued further decrease in abdominal and pelvic lymphadenopathy. No new or progressive lymphadenopathy on today's exam. 2. Hepatic steatosis. Subtle nodularity of liver contour raises the question of cirrhosis. 3. Pulmonary arterial enlargement. Pulmonary arterial hypertension a distinct concern.  2. H/o Rituxan infusion reaction-  -Had significant flushing with rituxan needing steroids. Not a candidate in future for rapid infusion protocol.  3.  History of some rigors and flushing with Gazyva controlled with adding Demerol and adjusting premedications.  4. Sleep apnea Notes he had a sleep study since his last clinic visit and diagnosis was confirmed. -uses CPAP  5. history of influenza A  6.  Mildly elevated transaminases less than 2 times upper limit of normal likely related to his recent influenza A and Tamiflu.  We will monitor-now resolved  PLAN: -Discussed lab results from 06/07/22 with the patient. CBC showed WBC of 2.7 K and hematocrit of 38.6 K. CMP was stable. LDH was 162. -No notable toxicities from venetoclax 300 mg p.o. daily. -Continue Venetoclax at 300 mg. -No evidence of CLL progression at this time. -counseled to ensure he stay up to speed with his influenza, covid 19 and pneumococcal vaccinations -counseled on infection precautions  Follow-up: RTC  with Dr Irene Limbo with labs in 2 months  The total time spent in the appointment was 20 minutes* .  All of the patient's  questions were answered with apparent satisfaction. The patient knows to call the clinic with any problems, questions or concerns.   Sullivan Lone MD MS AAHIVMS [facility] Hematology/Oncology Physician [facility]  .*Total Encounter Time as defined by the Centers for Medicare and Medicaid Services includes, in addition to the face-to-face time of a patient visit (documented in the note above) non-face-to-face time: obtaining and reviewing outside history, ordering and reviewing medications, tests or procedures, care coordination (communications with other health care professionals or caregivers) and documentation in the medical record.   Zettie Cooley, am acting as a Education administrator for Sullivan Lone, MD. .I have reviewed the above documentation for accuracy and completeness, and I agree with the above. Brunetta Genera MD

## 2022-06-14 ENCOUNTER — Other Ambulatory Visit: Payer: Self-pay

## 2022-06-14 ENCOUNTER — Encounter: Payer: Self-pay | Admitting: Hematology

## 2022-06-14 ENCOUNTER — Telehealth: Payer: Self-pay | Admitting: Pharmacist

## 2022-06-14 NOTE — Telephone Encounter (Signed)
Oral Oncology Pharmacist Encounter   Received notification from [facility] that prior authorization renewal for Venclexta is required.   PA faxed to [phone] Status is pending   Oral Oncology Clinic will continue to follow.   Leron Croak, PharmD, BCPS, BCOP Hematology/Oncology Clinical Pharmacist [facility] [place] [phone] 06/14/2022 2:00 PM

## 2022-06-15 ENCOUNTER — Other Ambulatory Visit (HOSPITAL_COMMUNITY): Payer: Self-pay

## 2022-06-15 ENCOUNTER — Other Ambulatory Visit: Payer: Self-pay

## 2022-06-15 NOTE — Telephone Encounter (Signed)
Oral Oncology Patient Advocate Encounter  Prior Authorization for Jordan Schroeder has been approved.    PA# [id number] Effective dates: 06/14/22 through 06/15/23  Patient may continue to fill at [facility].    Jordan Schroeder, CPhT-Adv Oncology Pharmacy Patient [place] Direct Number: [phone]  Fax: [phone]

## 2022-06-16 ENCOUNTER — Other Ambulatory Visit: Payer: Self-pay

## 2022-06-17 ENCOUNTER — Other Ambulatory Visit: Payer: Self-pay

## 2022-07-07 ENCOUNTER — Other Ambulatory Visit (HOSPITAL_COMMUNITY): Payer: Self-pay

## 2022-07-08 ENCOUNTER — Other Ambulatory Visit (HOSPITAL_COMMUNITY): Payer: Self-pay

## 2022-08-02 ENCOUNTER — Other Ambulatory Visit: Payer: Self-pay | Admitting: Hematology

## 2022-08-02 ENCOUNTER — Other Ambulatory Visit (HOSPITAL_COMMUNITY): Payer: Self-pay

## 2022-08-02 MED ORDER — VENETOCLAX 100 MG PO TABS
300.0000 mg | ORAL_TABLET | Freq: Every day | ORAL | 1 refills | Status: DC
  Filled 2022-08-02: qty 90, 30d supply, fill #0
  Filled 2022-08-30: qty 90, 30d supply, fill #1

## 2022-08-03 DIAGNOSIS — G4733 Obstructive sleep apnea (adult) (pediatric): Secondary | ICD-10-CM | POA: Diagnosis not present

## 2022-08-04 ENCOUNTER — Other Ambulatory Visit: Payer: Self-pay

## 2022-08-05 ENCOUNTER — Other Ambulatory Visit: Payer: Self-pay

## 2022-08-05 ENCOUNTER — Other Ambulatory Visit (HOSPITAL_COMMUNITY): Payer: Self-pay

## 2022-08-16 ENCOUNTER — Encounter: Payer: Self-pay | Admitting: Hematology

## 2022-08-22 ENCOUNTER — Other Ambulatory Visit: Payer: Self-pay

## 2022-08-22 DIAGNOSIS — C9110 Chronic lymphocytic leukemia of B-cell type not having achieved remission: Secondary | ICD-10-CM

## 2022-08-23 ENCOUNTER — Inpatient Hospital Stay: Payer: BLUE CROSS/BLUE SHIELD | Attending: Hematology

## 2022-08-23 ENCOUNTER — Inpatient Hospital Stay (HOSPITAL_BASED_OUTPATIENT_CLINIC_OR_DEPARTMENT_OTHER): Payer: BLUE CROSS/BLUE SHIELD | Admitting: Hematology

## 2022-08-23 VITALS — BP 110/77 | HR 99 | Temp 99.3°F | Resp 20 | Wt 279.4 lb

## 2022-08-23 DIAGNOSIS — C9112 Chronic lymphocytic leukemia of B-cell type in relapse: Secondary | ICD-10-CM | POA: Insufficient documentation

## 2022-08-23 DIAGNOSIS — C9110 Chronic lymphocytic leukemia of B-cell type not having achieved remission: Secondary | ICD-10-CM

## 2022-08-23 DIAGNOSIS — G4730 Sleep apnea, unspecified: Secondary | ICD-10-CM | POA: Diagnosis not present

## 2022-08-23 LAB — CMP (CANCER CENTER ONLY)
ALT: 20 U/L (ref 0–44)
AST: 22 U/L (ref 15–41)
Albumin: 4.5 g/dL (ref 3.5–5.0)
Alkaline Phosphatase: 68 U/L (ref 38–126)
Anion gap: 5 (ref 5–15)
BUN: 13 mg/dL (ref 6–20)
CO2: 30 mmol/L (ref 22–32)
Calcium: 9.4 mg/dL (ref 8.9–10.3)
Chloride: 105 mmol/L (ref 98–111)
Creatinine: 0.96 mg/dL (ref 0.61–1.24)
GFR, Estimated: >60 mL/min (ref >60)
Glucose, Bld: 94 mg/dL (ref 70–99)
Potassium: 4.6 mmol/L (ref 3.5–5.1)
Sodium: 140 mmol/L (ref 135–145)
Total Bilirubin: 0.4 mg/dL (ref 0.3–1.2)
Total Protein: 6.7 g/dL (ref 6.5–8.1)

## 2022-08-23 LAB — CBC WITH DIFFERENTIAL (CANCER CENTER ONLY)
Abs Immature Granulocytes: 0.03 10*3/uL (ref 0.00–0.07)
Basophils Absolute: 0 10*3/uL (ref 0.0–0.1)
Basophils Relative: 0 %
Eosinophils Absolute: 0 10*3/uL (ref 0.0–0.5)
Eosinophils Relative: 0 %
HCT: 41.8 % (ref 39.0–52.0)
Hemoglobin: 14.6 g/dL (ref 13.0–17.0)
Immature Granulocytes: 1 %
Lymphocytes Relative: 23 %
Lymphs Abs: 0.9 10*3/uL (ref 0.7–4.0)
MCH: 31.9 pg (ref 26.0–34.0)
MCHC: 34.9 g/dL (ref 30.0–36.0)
MCV: 91.5 fL (ref 80.0–100.0)
Monocytes Absolute: 0.7 10*3/uL (ref 0.1–1.0)
Monocytes Relative: 19 %
Neutro Abs: 2.1 10*3/uL (ref 1.7–7.7)
Neutrophils Relative %: 57 %
Platelet Count: 273 10*3/uL (ref 150–400)
RBC: 4.57 MIL/uL (ref 4.22–5.81)
RDW: 13 % (ref 11.5–15.5)
WBC Count: 3.8 10*3/uL — ABNORMAL LOW (ref 4.0–10.5)
nRBC: 0 % (ref 0.0–0.2)

## 2022-08-23 LAB — LACTATE DEHYDROGENASE: LDH: 219 U/L — ABNORMAL HIGH (ref 98–192)

## 2022-08-23 NOTE — Progress Notes (Signed)
HEMATOLOGY/ONCOLOGY CLINIC VISIT NOTE  Date of Service: 08/23/2022  Patient Care Team: [name], MD as PCP - General (Family Medicine)  CHIEF COMPLAINTS/PURPOSE OF CONSULTATION:   follow-up for continued evaluation and management of CLL  HISTORY OF PRESENTING ILLNESS:  See previous note for details on initial presentation.  Interval History:  Jordan Schroeder is a 42 y.o. male is here today for continued evaluation and management of his CLL.  Patient was last seen by me on 06/08/2022 and he was doing well overall. He was taking anti-histamine due to ear pain for nasal congestion.  Patient reports he has been doing well overall without any new medical concerns since our last visit. He denies fever, chills, night sweats, diarrhea, infection issues, new lumps/bumps, chest pain, abdominal pain, back pain, or leg swelling.   He has been taking Venetoclax regularly as prescribed without any toxicities. He has been using C-Pap machine.   He is complaint with all of his medications. He regularly takes Vitamin-D supplement.    MEDICAL HISTORY:  Past Medical History:  Diagnosis Date   High cholesterol    Hypertension    Leukemia ([place])     SURGICAL HISTORY: No past surgical history on file.  SOCIAL HISTORY: Social History   Socioeconomic History   Marital status: Married    Spouse name: Not on file   Number of children: Not on file   Years of education: Not on file   Highest education level: Not on file  Occupational History   Not on file  Tobacco Use   Smoking status: Never   Smokeless tobacco: Never  Vaping Use   Vaping Use: Never used  Substance and Sexual Activity   Alcohol use: Not Currently   Drug use: Never   Sexual activity: Not on file  Other Topics Concern   Not on file  Social History Narrative   Not on file   Social Determinants of Health   Financial Resource Strain: Not on file  Food Insecurity: Not on file  Transportation Needs: Not on  file  Physical Activity: Not on file  Stress: Not on file  Social Connections: Not on file  Intimate Partner Violence: Not on file    FAMILY HISTORY: No family history on file.  ALLERGIES:  is allergic to gazyva [obinutuzumab] and rituximab.  MEDICATIONS:  Current Outpatient Medications  Medication Sig Dispense Refill   acyclovir (ZOVIRAX) 400 MG tablet Take 1 tablet (400 mg total) by mouth daily. (Patient not taking: Reported on 08/13/2019) 30 tablet 3   allopurinol (ZYLOPRIM) 100 MG tablet TAKE 1 TABLET BY MOUTH TWICE A DAY 180 tablet 1   azithromycin (ZITHROMAX Z-PAK) 250 MG tablet 2 tabs ('500mg'$ ) on day 1 and then 1 tab ('250mg'$ ) PO daily for 4 days 6 each 0   fenofibrate micronized (LOFIBRA) 67 MG capsule Take 67 mg by mouth daily before breakfast.     lisinopril (PRINIVIL,ZESTRIL) 10 MG tablet Take 10 mg by mouth daily.     ondansetron (ZOFRAN-ODT) 4 MG disintegrating tablet '4mg'$  ODT q4 hours prn nausea/vomit 10 tablet 0   sertraline (ZOLOFT) 100 MG tablet Take 100 mg by mouth daily.     venetoclax (VENCLEXTA) 100 MG tablet Take 3 tablets (300 mg total) by mouth daily. Tablets should be swallowed whole with a meal and a full glass of water. 90 tablet 1   VITAMIN D PO Take 2,000 Units by mouth.     No current facility-administered medications for this visit.  REVIEW OF SYSTEMS:   10 Point review of Systems was done is negative except as noted above.  PHYSICAL EXAMINATION: .BP 110/77   Pulse 99   Temp 99.3 F (37.4 C)   Resp 20   Wt 279 lb 6.4 oz (126.7 kg)   SpO2 99%   BMI 42.48 kg/m  . GENERAL:alert, in no acute distress and comfortable SKIN: no acute rashes, no significant lesions EYES: conjunctiva are pink and non-injected, sclera anicteric OROPHARYNX: MMM, no exudates, no oropharyngeal erythema or ulceration NECK: supple, no JVD LYMPH:  no palpable lymphadenopathy in the cervical, axillary or inguinal regions LUNGS: clear to auscultation b/l with normal  respiratory effort HEART: regular rate & rhythm ABDOMEN:  normoactive bowel sounds , non tender, not distended. Extremity: no pedal edema PSYCH: alert & oriented x 3 with fluent speech NEURO: no focal motor/sensory deficits   LABORATORY DATA:       Latest Ref Rng & Units 08/23/2022   11:49 AM 06/07/2022    2:58 PM 04/25/2022   12:54 PM  CBC  WBC 4.0 - 10.5 K/uL 3.8  2.7  7.5   Hemoglobin 13.0 - 17.0 g/dL 14.6  13.7  16.3   Hematocrit 39.0 - 52.0 % 41.8  38.6  47.3   Platelets 150 - 400 K/uL 273  209  217    ANC 2100      Latest Ref Rng & Units 06/07/2022    2:58 PM 04/25/2022   12:54 PM 02/21/2022   10:05 AM  CMP  Glucose 70 - 99 mg/dL 96  119  102   BUN 6 - 20 mg/dL '12  13  13   '$ Creatinine 0.61 - 1.24 mg/dL 0.69  1.11  0.83   Sodium 135 - 145 mmol/L 137  136  140   Potassium 3.5 - 5.1 mmol/L 3.6  4.1  4.4   Chloride 98 - 111 mmol/L 103  101  108   CO2 22 - 32 mmol/L '27  28  28   '$ Calcium 8.9 - 10.3 mg/dL 9.6  10.0  9.6   Total Protein 6.5 - 8.1 g/dL 6.2  6.8  6.2   Total Bilirubin 0.3 - 1.2 mg/dL 0.7  0.5  0.5   Alkaline Phos 38 - 126 U/L 71  75  82   AST 15 - 41 U/L '23  29  24   '$ ALT 0 - 44 U/L 26  43  32    . Lab Results  Component Value Date   LDH 162 06/07/2022   08/25/2021 Uric acid 3.8    Component     Latest Ref Rng & Units 10/30/2017  HIV Screen 4th Generation wRfx     Non Reactive Non Reactive  HCV Ab     0.0 - 0.9 s/co ratio <0.1  Hepatitis B Surface Ag     Negative Negative  Hep B Core Ab, Tot     Negative Negative      10/25/17 Tissue Flow Cytometry:    10/25/17 Needle/core Bx:   FISH Oncology  Order: [id number]  Status:  Final result   Visible to patient:  No (Not Released) Next appt:  11/07/2017 at 09:00 AM in Radiology ([facility] PET) Dx:  Lymphocytosis  Component 64moago  Specimen Type Comment:   Comment: BLOOD  Cells Counted 200   Cells Analyzed 200   FISH Result Comment:   Comment: NORMAL:  NO CYCLIN D1 OR IGH GENE REARRANGEMENT  OBSERVED  Interpretation Comment:   Comment: (  NOTE)               nuc ish 11q13(CCND1x2),14q32(IGHx2)[200]       The fluorescence in situ hybridization (FISH) result  was normal. Dual color dual fusion DNA FISH probes targeting  the cyclin D1 gene (CCND1 or BCL1)([employer] and the  heavy chain immunoglobulin gene (IgH) at 14q32 showed two  hybridization signals each with no fusion in all cells  examined. Thus, there was NO apparent rearrangement of the  primary genes involved with mantle cell lymphoma, and to a  lesser extent myeloma.  Marland Kitchen         RADIOGRAPHIC STUDIES: I have personally reviewed the radiological images as listed and agreed with the findings in the report. No results found. CT chest abdomen pelvis 03/26/2021: IMPRESSION: 1. Interval development of bulky lymphadenopathy in the chest, abdomen, and pelvis. Imaging features compatible with recurrent lymphoma. 2. No hepatosplenomegaly. 3. Enlargement of the pulmonary outflow tract and main pulmonary arteries suggests pulmonary arterial hypertension. 4. 2-3 mm left upper lobe pulmonary nodule was largely obscured on the prior study. Most likely benign, attention on follow-up recommended.     Electronically Signed   By: Misty Stanley M.D.  CT neck 03/26/2021 IMPRESSION: 1. Limited direct comparison to prior PET-CT (no prior neck CTs) with possible slight increase in size of a few left submandibular nodes. Otherwise, similar increased number of lymph nodes throughout the neck without substantial change in multiple mildly enlarged nodes (detailed above). 2. Oval 2.1 x 1.3 cm mass in the posterior right nasal cavity, nonspecific but potentially a polyp or retention cyst. Recommend correlation with direct inspection.     ASSESSMENT & PLAN:   42 y.o. male with  1.  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 Noted 13q deletion: 13q14.3 is at 47.67% for  normal nuclei, and 0000000 with mono allelic deletion of [id number].  -ATM for 11q22.3 revealed 81.67% with normal nuclei and 18.33% with positive nuclei for del 11q22.3   02/26/18 CT C/A/P revealed  Marked response to therapy as evidenced by decrease in size or resolution of adenopathy throughout chest, abdomen and pelvis. 2. Hepatic steatosis. Slight marginal irregularity suggests cirrhosis. 3. Enlarged pulmonary arteries, indicative of pulmonary arterial hypertension.   S/p 6 cycles of BR completed on 04/19/18 for first-line treatment  06/04/18 CT C/A/P revealed Stable CT chest without thoracic lymphadenopathy. Continued further decrease in abdominal and pelvic lymphadenopathy. No new or progressive lymphadenopathy on today's exam. 2. Hepatic steatosis. Subtle nodularity of liver contour raises the question of cirrhosis. 3. Pulmonary arterial enlargement. Pulmonary arterial hypertension a distinct concern.  2. H/o Rituxan infusion reaction-  -Had significant flushing with rituxan needing steroids. Not a candidate in future for rapid infusion protocol.  3.  History of some rigors and flushing with Gazyva controlled with adding Demerol and adjusting premedications.  4. Sleep apnea Notes he had a sleep study since his last clinic visit and diagnosis was confirmed. -uses CPAP  5. history of influenza A  6.  Mildly elevated transaminases less than 2 times upper limit of normal likely related to his recent influenza A and Tamiflu.  We will monitor-now resolved  PLAN: -discussed lab results from today, 08/23/2022, with the patient. CBC shows the patient is slightly neutropenic at WBC of 3.8. CMP is stable.  -Repeat CT scan before our next visit.  -We will decide the next treatment plan, either low-dose of Venetoclax or just watch,  after CT scan results. Patient agrees with the plan.  -No notable toxicities from venetoclax 300 mg p.o. daily. -Continue Venetoclax at 300 mg. -No evidence of CLL  progression at this time.  FOLLOW-UP: CT neck/chest/abd/pelvis in 11 weeks  RTC with Dr Irene Limbo with labs in 12 weeks  The total time spent in the appointment was 21 minutes* .  All of the patient's questions were answered with apparent satisfaction. The patient knows to call the clinic with any problems, questions or concerns.   Sullivan Lone MD MS AAHIVMS [facility] Hematology/Oncology Physician [facility]  .*Total Encounter Time as defined by the Centers for Medicare and Medicaid Services includes, in addition to the face-to-face time of a patient visit (documented in the note above) non-face-to-face time: obtaining and reviewing outside history, ordering and reviewing medications, tests or procedures, care coordination (communications with other health care professionals or caregivers) and documentation in the medical record.   I, Cleda Mccreedy, am acting as a Education administrator for Sullivan Lone, MD. .I have reviewed the above documentation for accuracy and completeness, and I agree with the above. Brunetta Genera MD

## 2022-08-24 ENCOUNTER — Telehealth: Payer: Self-pay | Admitting: Hematology

## 2022-08-24 NOTE — Telephone Encounter (Signed)
Called patient per 2/20 los notes to schedule f/u. Patient scheduled and notified.

## 2022-08-25 ENCOUNTER — Other Ambulatory Visit: Payer: Self-pay

## 2022-08-29 ENCOUNTER — Encounter: Payer: Self-pay | Admitting: Hematology

## 2022-08-30 ENCOUNTER — Other Ambulatory Visit (HOSPITAL_COMMUNITY): Payer: Self-pay

## 2022-09-12 ENCOUNTER — Other Ambulatory Visit: Payer: Self-pay

## 2022-09-13 ENCOUNTER — Other Ambulatory Visit (HOSPITAL_COMMUNITY): Payer: Self-pay

## 2022-09-22 ENCOUNTER — Other Ambulatory Visit (HOSPITAL_COMMUNITY): Payer: Self-pay

## 2022-09-28 ENCOUNTER — Other Ambulatory Visit (HOSPITAL_COMMUNITY): Payer: Self-pay

## 2022-09-29 ENCOUNTER — Encounter: Payer: Self-pay | Admitting: Hematology

## 2022-10-04 ENCOUNTER — Other Ambulatory Visit: Payer: Self-pay

## 2022-10-05 ENCOUNTER — Other Ambulatory Visit (HOSPITAL_COMMUNITY): Payer: Self-pay

## 2022-10-07 ENCOUNTER — Other Ambulatory Visit: Payer: Self-pay | Admitting: Hematology

## 2022-10-10 ENCOUNTER — Other Ambulatory Visit: Payer: Self-pay

## 2022-10-10 ENCOUNTER — Other Ambulatory Visit: Payer: Self-pay | Admitting: Hematology

## 2022-10-10 MED ORDER — VENETOCLAX 100 MG PO TABS
300.0000 mg | ORAL_TABLET | Freq: Every day | ORAL | 1 refills | Status: DC
  Filled 2022-10-10: qty 90, 30d supply, fill #0
  Filled 2022-10-14: qty 84, 28d supply, fill #0
  Filled 2022-10-14: qty 90, 30d supply, fill #0
  Filled 2022-11-03: qty 84, 28d supply, fill #1

## 2022-10-14 ENCOUNTER — Other Ambulatory Visit (HOSPITAL_COMMUNITY): Payer: Self-pay

## 2022-10-14 ENCOUNTER — Other Ambulatory Visit: Payer: Self-pay

## 2022-10-20 DIAGNOSIS — Z23 Encounter for immunization: Secondary | ICD-10-CM | POA: Diagnosis not present

## 2022-10-20 DIAGNOSIS — Z0000 Encounter for general adult medical examination without abnormal findings: Secondary | ICD-10-CM | POA: Diagnosis not present

## 2022-10-28 DIAGNOSIS — Z125 Encounter for screening for malignant neoplasm of prostate: Secondary | ICD-10-CM | POA: Diagnosis not present

## 2022-10-28 DIAGNOSIS — R739 Hyperglycemia, unspecified: Secondary | ICD-10-CM | POA: Diagnosis not present

## 2022-10-28 DIAGNOSIS — E559 Vitamin D deficiency, unspecified: Secondary | ICD-10-CM | POA: Diagnosis not present

## 2022-10-28 DIAGNOSIS — E785 Hyperlipidemia, unspecified: Secondary | ICD-10-CM | POA: Diagnosis not present

## 2022-11-03 ENCOUNTER — Other Ambulatory Visit (HOSPITAL_COMMUNITY): Payer: Self-pay

## 2022-11-07 ENCOUNTER — Ambulatory Visit (HOSPITAL_COMMUNITY)
Admission: RE | Admit: 2022-11-07 | Discharge: 2022-11-07 | Disposition: A | Discharge status: Home / Self Care | Payer: BLUE CROSS/BLUE SHIELD | Source: Ambulatory Visit | Attending: Hematology | Admitting: Hematology

## 2022-11-07 DIAGNOSIS — K2289 Other specified disease of esophagus: Secondary | ICD-10-CM | POA: Diagnosis not present

## 2022-11-07 DIAGNOSIS — C9110 Chronic lymphocytic leukemia of B-cell type not having achieved remission: Secondary | ICD-10-CM | POA: Diagnosis not present

## 2022-11-07 DIAGNOSIS — C969 Malignant neoplasm of lymphoid, hematopoietic and related tissue, unspecified: Secondary | ICD-10-CM | POA: Diagnosis not present

## 2022-11-07 DIAGNOSIS — K760 Fatty (change of) liver, not elsewhere classified: Secondary | ICD-10-CM | POA: Diagnosis not present

## 2022-11-07 MED ORDER — IOHEXOL 300 MG/ML  SOLN
100.0000 mL | Freq: Once | INTRAMUSCULAR | Status: AC | PRN
  Administered 2022-11-07: 100 mL via INTRAVENOUS

## 2022-11-11 ENCOUNTER — Other Ambulatory Visit: Payer: Self-pay

## 2022-11-15 ENCOUNTER — Inpatient Hospital Stay: Payer: BLUE CROSS/BLUE SHIELD | Admitting: Hematology

## 2022-11-15 ENCOUNTER — Other Ambulatory Visit: Payer: Self-pay

## 2022-11-15 ENCOUNTER — Inpatient Hospital Stay: Payer: BLUE CROSS/BLUE SHIELD | Attending: Hematology

## 2022-11-15 VITALS — BP 120/83 | HR 96 | Temp 97.9°F | Resp 17 | Wt 277.7 lb

## 2022-11-15 DIAGNOSIS — R7401 Elevation of levels of liver transaminase levels: Secondary | ICD-10-CM | POA: Diagnosis not present

## 2022-11-15 DIAGNOSIS — C9112 Chronic lymphocytic leukemia of B-cell type in relapse: Secondary | ICD-10-CM | POA: Diagnosis not present

## 2022-11-15 DIAGNOSIS — C9110 Chronic lymphocytic leukemia of B-cell type not having achieved remission: Secondary | ICD-10-CM

## 2022-11-15 DIAGNOSIS — G4730 Sleep apnea, unspecified: Secondary | ICD-10-CM | POA: Diagnosis not present

## 2022-11-15 LAB — CMP (CANCER CENTER ONLY)
ALT: 87 U/L — ABNORMAL HIGH (ref 0–44)
AST: 64 U/L — ABNORMAL HIGH (ref 15–41)
Albumin: 4.8 g/dL (ref 3.5–5.0)
Alkaline Phosphatase: 72 U/L (ref 38–126)
Anion gap: 6 (ref 5–15)
BUN: 14 mg/dL (ref 6–20)
CO2: 30 mmol/L (ref 22–32)
Calcium: 9.8 mg/dL (ref 8.9–10.3)
Chloride: 106 mmol/L (ref 98–111)
Creatinine: 1.09 mg/dL (ref 0.61–1.24)
GFR, Estimated: >60 mL/min
Glucose, Bld: 95 mg/dL (ref 70–99)
Potassium: 3.9 mmol/L (ref 3.5–5.1)
Sodium: 142 mmol/L (ref 135–145)
Total Bilirubin: 0.6 mg/dL (ref 0.3–1.2)
Total Protein: 6.7 g/dL (ref 6.5–8.1)

## 2022-11-15 LAB — CBC WITH DIFFERENTIAL (CANCER CENTER ONLY)
Abs Immature Granulocytes: 0.01 10*3/uL (ref 0.00–0.07)
Basophils Absolute: 0 10*3/uL (ref 0.0–0.1)
Basophils Relative: 0 %
Eosinophils Absolute: 0 10*3/uL (ref 0.0–0.5)
Eosinophils Relative: 1 %
HCT: 43.5 % (ref 39.0–52.0)
Hemoglobin: 14.9 g/dL (ref 13.0–17.0)
Immature Granulocytes: 0 %
Lymphocytes Relative: 25 %
Lymphs Abs: 1 10*3/uL (ref 0.7–4.0)
MCH: 31.8 pg (ref 26.0–34.0)
MCHC: 34.3 g/dL (ref 30.0–36.0)
MCV: 92.9 fL (ref 80.0–100.0)
Monocytes Absolute: 0.9 10*3/uL (ref 0.1–1.0)
Monocytes Relative: 22 %
Neutro Abs: 2.1 10*3/uL (ref 1.7–7.7)
Neutrophils Relative %: 52 %
Platelet Count: 194 10*3/uL (ref 150–400)
RBC: 4.68 MIL/uL (ref 4.22–5.81)
RDW: 13.7 % (ref 11.5–15.5)
WBC Count: 4.1 10*3/uL (ref 4.0–10.5)
nRBC: 0 % (ref 0.0–0.2)

## 2022-11-15 LAB — LACTATE DEHYDROGENASE: LDH: 169 U/L (ref 98–192)

## 2022-11-15 NOTE — Progress Notes (Signed)
HEMATOLOGY/ONCOLOGY CLINIC VISIT NOTE  Date of Service: 11/15/2022  Patient Care Team: Jordan Moccasin, MD as PCP - General (Family Medicine)  CHIEF COMPLAINTS/PURPOSE OF CONSULTATION:   follow-up for continued evaluation and management of CLL  HISTORY OF PRESENTING ILLNESS:  See previous note for details on initial presentation.  Interval History:  [name] is a 42 y.o. male is here today for continued evaluation and management of his CLL.  Patient was last seen by me on 08/23/2022 and he was doing well overall.   Patient reports he has been doing well overall without any new or severe medical concerns since our last visit. He denies fever, chills, fatigue, infections issues, unexpected weight loss, abdominal pain, chest pain, back pain, or leg swelling. He has lost around 5 lbs since our last visit, but it is not unexpected.   He regularly takes Venetoclax 300 mg as prescribed and he has been tolerating it well without any new or severe medical toxicities.     MEDICAL HISTORY:  Past Medical History:  Diagnosis Date   High cholesterol    Hypertension    Leukemia (HCC)     SURGICAL HISTORY: No past surgical history on file.  SOCIAL HISTORY: Social History   Socioeconomic History   Marital status: Married    Spouse name: Not on file   Number of children: Not on file   Years of education: Not on file   Highest education level: Not on file  Occupational History   Not on file  Tobacco Use   Smoking status: Never   Smokeless tobacco: Never  Vaping Use   Vaping Use: Never used  Substance and Sexual Activity   Alcohol use: Not Currently   Drug use: Never   Sexual activity: Not on file  Other Topics Concern   Not on file  Social History Narrative   Not on file   Social Determinants of Health   Financial Resource Strain: Not on file  Food Insecurity: Not on file  Transportation Needs: Not on file  Physical Activity: Not on file  Stress: Not on file   Social Connections: Not on file  Intimate Partner Violence: Not on file    FAMILY HISTORY: No family history on file.  ALLERGIES:  is allergic to gazyva [obinutuzumab] and rituximab.  MEDICATIONS:  Current Outpatient Medications  Medication Sig Dispense Refill   acyclovir (ZOVIRAX) 400 MG tablet Take 1 tablet (400 mg total) by mouth daily. (Patient not taking: Reported on 08/13/2019) 30 tablet 3   allopurinol (ZYLOPRIM) 100 MG tablet TAKE 1 TABLET BY MOUTH TWICE A DAY 180 tablet 1   azithromycin (ZITHROMAX Z-PAK) 250 MG tablet 2 tabs (500mg ) on day 1 and then 1 tab (250mg ) PO daily for 4 days 6 each 0   fenofibrate micronized (LOFIBRA) 67 MG capsule Take 67 mg by mouth daily before breakfast.     lisinopril (PRINIVIL,ZESTRIL) 10 MG tablet Take 10 mg by mouth daily.     ondansetron (ZOFRAN-ODT) 4 MG disintegrating tablet 4mg  ODT q4 hours prn nausea/vomit 10 tablet 0   sertraline (ZOLOFT) 100 MG tablet Take 100 mg by mouth daily.     venetoclax (VENCLEXTA) 100 MG tablet Take 3 tablets (300 mg total) by mouth daily. Tablets should be swallowed whole with a meal and a full glass of water. 90 tablet 1   VITAMIN D PO Take 2,000 Units by mouth.     No current facility-administered medications for this visit.    REVIEW  OF SYSTEMS:   10 Point review of Systems was done is negative except as noted above.  PHYSICAL EXAMINATION: .BP 120/83 (BP Location: Left Arm, Patient Position: Sitting)   Pulse 96   Temp 97.9 F (36.6 C) (Temporal)   Resp 17   Wt 277 lb 11.2 oz (126 kg)   SpO2 98%   BMI 42.22 kg/m  . GENERAL:alert, in no acute distress and comfortable SKIN: no acute rashes, no significant lesions EYES: conjunctiva are pink and non-injected, sclera anicteric OROPHARYNX: MMM, no exudates, no oropharyngeal erythema or ulceration NECK: supple, no JVD LYMPH:  no palpable lymphadenopathy in the cervical, axillary or inguinal regions LUNGS: clear to auscultation b/l with normal  respiratory effort HEART: regular rate & rhythm ABDOMEN:  normoactive bowel sounds , non tender, not distended. No palpable hepatosplenomegaly Extremity: no pedal edema PSYCH: alert & oriented x 3 with fluent speech NEURO: no focal motor/sensory deficits   LABORATORY DATA:       Latest Ref Rng & Units 11/15/2022   12:47 PM 08/23/2022   11:49 AM 06/07/2022    2:58 PM  CBC  WBC 4.0 - 10.5 K/uL 4.1  3.8  2.7   Hemoglobin 13.0 - 17.0 g/dL 16.1  09.6  04.5   Hematocrit 39.0 - 52.0 % 43.5  41.8  38.6   Platelets 150 - 400 K/uL 194  273  209         Latest Ref Rng & Units 11/15/2022   12:47 PM 08/23/2022   11:49 AM 06/07/2022    2:58 PM  CMP  Glucose 70 - 99 mg/dL 95  94  96   BUN 6 - 20 mg/dL 14  13  12    Creatinine 0.61 - 1.24 mg/dL 4.09  8.11  9.14   Sodium 135 - 145 mmol/L 142  140  137   Potassium 3.5 - 5.1 mmol/L 3.9  4.6  3.6   Chloride 98 - 111 mmol/L 106  105  103   CO2 22 - 32 mmol/L 30  30  27    Calcium 8.9 - 10.3 mg/dL 9.8  9.4  9.6   Total Protein 6.5 - 8.1 g/dL 6.7  6.7  6.2   Total Bilirubin 0.3 - 1.2 mg/dL 0.6  0.4  0.7   Alkaline Phos 38 - 126 U/L 72  68  71   AST 15 - 41 U/L 64  22  23   ALT 0 - 44 U/L 87  20  26    . Lab Results  Component Value Date   LDH 169 11/15/2022   08/25/2021 Uric acid 3.8    Component     Latest Ref Rng & Units 10/30/2017  HIV Screen 4th Generation wRfx     Non Reactive Non Reactive  HCV Ab     0.0 - 0.9 s/co ratio <0.1  Hepatitis B Surface Ag     Negative Negative  Hep B Core Ab, Tot     Negative Negative      10/25/17 Tissue Flow Cytometry:    10/25/17 Needle/core Bx:   FISH Oncology  Order: [id number]  Status:  Final result   Visible to patient:  No (Not Released) Next appt:  11/07/2017 at 09:00 AM in Radiology ([facility]) Dx:  Lymphocytosis  Component 61mo ago  Specimen Type Comment:   Comment: BLOOD  Cells Counted 200   Cells Analyzed 200   FISH Result Comment:   Comment: NORMAL:  NO CYCLIN D1 OR IGH  GENE  REARRANGEMENT OBSERVED  Interpretation Comment:   Comment: (NOTE)               nuc ish 11q13(CCND1x2),14q32(IGHx2)[200]       The fluorescence in situ hybridization (FISH) result  was normal. Dual color dual fusion DNA FISH probes targeting  the cyclin D1 gene (CCND1 or BCL1)([employer] and the  heavy chain immunoglobulin gene (IgH) at 14q32 showed two  hybridization signals each with no fusion in all cells  examined. Thus, there was NO apparent rearrangement of the  primary genes involved with mantle cell lymphoma, and to a  lesser extent myeloma.  Marland Kitchen         RADIOGRAPHIC STUDIES: I have personally reviewed the radiological images as listed and agreed with the findings in the report. CT CHEST ABDOMEN PELVIS W CONTRAST  Result Date: 11/07/2022 CLINICAL DATA:  Hematologic malignancy assess treatment response. * Tracking Code: BO *. EXAM: CT CHEST, ABDOMEN, AND PELVIS WITH CONTRAST TECHNIQUE: Multidetector CT imaging of the chest, abdomen and pelvis was performed following the standard protocol during bolus administration of intravenous contrast. RADIATION DOSE REDUCTION: This exam was performed according to the departmental dose-optimization program which includes automated exposure control, adjustment of the mA and/or kV according to patient size and/or use of iterative reconstruction technique. CONTRAST:  OMNIPAQUE IOHEXOL 300 MG/ML  SOLN COMPARISON:  Multiple priors including most recent chest CT December 07, 2021 FINDINGS: CT CHEST FINDINGS Cardiovascular: Normal caliber thoracic aorta. No central pulmonary embolus on this nondedicated study. Normal size heart. No significant pericardial effusion/thickening. Mediastinum/Nodes: Decreased size of the bilateral axillary and mediastinal lymph nodes. Previously indexed right axillary lymph node now measures 18 x 9 mm on image 12/2 previously 23 x 13 mm. No new or enlarging lymph nodes identified. Symmetric distal esophageal wall thickening.  Lungs/Pleura: Motion degraded examination of the lungs reveals no suspicious pulmonary nodules or masses. No pleural effusion. No pneumothorax. Musculoskeletal: No aggressive lytic or blastic lesion of bone. CT ABDOMEN PELVIS FINDINGS Hepatobiliary: Hepatic steatosis. No suspicious hepatic lesion. Gallbladder is unremarkable. No biliary ductal dilation. Pancreas: No pancreatic ductal dilation or evidence of acute inflammation. Spleen: No splenomegaly or focal splenic lesion. Adrenals/Urinary Tract: Bilateral adrenal glands appear normal. No hydronephrosis. Kidneys demonstrate symmetric and excretion of contrast material. Urinary bladder is minimally distended limiting evaluation. Stomach/Bowel: No radiopaque enteric contrast material was administered. Stomach is unremarkable for degree of distension. No pathologic dilation of small or large bowel. No evidence of acute bowel inflammation. Vascular/Lymphatic: Normal caliber abdominal aorta. Smooth IVC contours. Decreased size of the retroperitoneal, mesenteric and pelvic lymph nodes. No new or enlarging abdominal or pelvic lymph nodes identified. Previously indexed lymph nodes are as follows: -hepatoduodenal ligament lymph node measures 4.7 x 2.5 cm on image 63/2 previously 7.0 x 4.4 cm. -left pelvic sidewall lymph node now measures 4.4 x 1.0 cm on image 112/2 previously 5.7 x 2.0 cm. Reproductive: Prostate is unremarkable. Other: No significant abdominopelvic free fluid. Musculoskeletal: No aggressive lytic or blastic lesion of bone. IMPRESSION: 1. Decreased size of the lymph nodes above and below the diaphragm, consistent with treatment response. No new or enlarging lymph nodes identified. 2. No splenomegaly. 3. Hepatic steatosis. 4. Symmetric distal esophageal wall thickening, nonspecific, possibly reflecting esophagitis. Consider further evaluation with upper endoscopy. Electronically Signed   By: Maudry Mayhew M.D.   On: 11/07/2022 18:31   CT chest abdomen  pelvis 03/26/2021: IMPRESSION: 1. Interval development of bulky lymphadenopathy in the chest, abdomen, and pelvis. Imaging features  compatible with recurrent lymphoma. 2. No hepatosplenomegaly. 3. Enlargement of the pulmonary outflow tract and main pulmonary arteries suggests pulmonary arterial hypertension. 4. 2-3 mm left upper lobe pulmonary nodule was largely obscured on the prior study. Most likely benign, attention on follow-up recommended.     Electronically Signed   By: Kennith Center M.D.  CT neck 03/26/2021 IMPRESSION: 1. Limited direct comparison to prior PET-CT (no prior neck CTs) with possible slight increase in size of a few left submandibular nodes. Otherwise, similar increased number of lymph nodes throughout the neck without substantial change in multiple mildly enlarged nodes (detailed above). 2. Oval 2.1 x 1.3 cm mass in the posterior right nasal cavity, nonspecific but potentially a polyp or retention cyst. Recommend correlation with direct inspection.     ASSESSMENT & PLAN:   42 y.o. male with  1.  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 Noted 13q deletion: 13q14.3 is at 47.67% for normal nuclei, and 35.67% with mono allelic deletion of [id number].  -ATM for 11q22.3 revealed 81.67% with normal nuclei and 18.33% with positive nuclei for del 11q22.3   02/26/18 CT C/A/P revealed  Marked response to therapy as evidenced by decrease in size or resolution of adenopathy throughout chest, abdomen and pelvis. 2. Hepatic steatosis. Slight marginal irregularity suggests cirrhosis. 3. Enlarged pulmonary arteries, indicative of pulmonary arterial hypertension.   S/p 6 cycles of BR completed on 04/19/18 for first-line treatment  06/04/18 CT C/A/P revealed Stable CT chest without thoracic lymphadenopathy. Continued further decrease in abdominal and pelvic lymphadenopathy. No new or progressive lymphadenopathy on  today's exam. 2. Hepatic steatosis. Subtle nodularity of liver contour raises the question of cirrhosis. 3. Pulmonary arterial enlargement. Pulmonary arterial hypertension a distinct concern.  2. H/o Rituxan infusion reaction-  -Had significant flushing with rituxan needing steroids. Not a candidate in future for rapid infusion protocol.  3.  History of some rigors and flushing with Gazyva controlled with adding Demerol and adjusting premedications.  4. Sleep apnea Notes he had a sleep study since his last clinic visit and diagnosis was confirmed. -uses CPAP  5. history of influenza A  6.  Mildly elevated transaminases less than 2 times upper limit of normal likely related to his recent influenza A and Tamiflu.  We will monitor-now resolved  PLAN: -Discussed lab results from today, 11/15/2022, with the patient. CBC is stable. CMP shows elevated AST at 64 an elevated ALT at 87. LDH in the normal range.  -Discussed CT chest scan results from 11/07/2022 with the patient. Shows Decreased size of the lymph nodes above and below the diaphragm, consistent with treatment response. No new or enlarging lymph nodes identified. -No notable toxicities from venetoclax 300 mg p.o. daily. -Continue Venetoclax at 300 mg. -No evidence of CLL progression at this time.   FOLLOW-UP: RTC with Dr Candise Che with labs in 2 months   The total time spent in the appointment was 21 minutes* .  All of the patient's questions were answered with apparent satisfaction. The patient knows to call the clinic with any problems, questions or concerns.   Wyvonnia Lora MD MS AAHIVMS [facility] Hematology/Oncology Physician [facility]  .*Total Encounter Time as defined by the Centers for Medicare and Medicaid Services includes, in addition to the face-to-face time of a patient visit (documented in the note above) non-face-to-face time: obtaining and reviewing outside history, ordering and reviewing medications, tests  or procedures, care coordination (communications  with other health care professionals or caregivers) and documentation in the medical record.   I, Ok Edwards, am acting as a Neurosurgeon for Wyvonnia Lora, MD. .I have reviewed the above documentation for accuracy and completeness, and I agree with the above. Johney Maine MD

## 2022-11-16 ENCOUNTER — Telehealth: Payer: Self-pay | Admitting: Hematology

## 2022-11-16 ENCOUNTER — Other Ambulatory Visit: Payer: Self-pay

## 2022-11-17 ENCOUNTER — Other Ambulatory Visit: Payer: Self-pay

## 2022-11-20 ENCOUNTER — Encounter: Payer: Self-pay | Admitting: Hematology

## 2022-12-06 ENCOUNTER — Other Ambulatory Visit: Payer: Self-pay | Admitting: Hematology

## 2022-12-06 ENCOUNTER — Other Ambulatory Visit: Payer: Self-pay

## 2022-12-06 ENCOUNTER — Other Ambulatory Visit (HOSPITAL_COMMUNITY): Payer: Self-pay

## 2022-12-06 MED ORDER — VENETOCLAX 100 MG PO TABS
300.0000 mg | ORAL_TABLET | Freq: Every day | ORAL | 1 refills | Status: DC
  Filled 2022-12-06: qty 84, 28d supply, fill #0
  Filled 2023-01-04: qty 90, 30d supply, fill #1
  Filled 2023-01-12: qty 84, 28d supply, fill #1

## 2022-12-14 ENCOUNTER — Other Ambulatory Visit: Payer: Self-pay

## 2022-12-21 ENCOUNTER — Telehealth: Payer: Self-pay | Admitting: Hematology

## 2023-01-04 ENCOUNTER — Other Ambulatory Visit (HOSPITAL_COMMUNITY): Payer: Self-pay

## 2023-01-11 ENCOUNTER — Other Ambulatory Visit (HOSPITAL_COMMUNITY): Payer: Self-pay

## 2023-01-12 ENCOUNTER — Other Ambulatory Visit: Payer: Self-pay

## 2023-01-19 ENCOUNTER — Other Ambulatory Visit: Payer: Self-pay

## 2023-01-19 ENCOUNTER — Inpatient Hospital Stay: Payer: BLUE CROSS/BLUE SHIELD | Attending: Hematology

## 2023-01-19 ENCOUNTER — Inpatient Hospital Stay: Payer: BLUE CROSS/BLUE SHIELD | Admitting: Hematology

## 2023-01-19 VITALS — BP 114/78 | HR 110 | Temp 98.8°F | Resp 20 | Wt 283.4 lb

## 2023-01-19 DIAGNOSIS — C9112 Chronic lymphocytic leukemia of B-cell type in relapse: Secondary | ICD-10-CM | POA: Diagnosis not present

## 2023-01-19 DIAGNOSIS — C9110 Chronic lymphocytic leukemia of B-cell type not having achieved remission: Secondary | ICD-10-CM | POA: Diagnosis not present

## 2023-01-19 DIAGNOSIS — G4730 Sleep apnea, unspecified: Secondary | ICD-10-CM | POA: Insufficient documentation

## 2023-01-19 LAB — CBC WITH DIFFERENTIAL (CANCER CENTER ONLY)
Abs Immature Granulocytes: 0.01 10*3/uL (ref 0.00–0.07)
Basophils Absolute: 0 10*3/uL (ref 0.0–0.1)
Basophils Relative: 0 %
Eosinophils Absolute: 0 10*3/uL (ref 0.0–0.5)
Eosinophils Relative: 0 %
HCT: 42.5 % (ref 39.0–52.0)
Hemoglobin: 14.8 g/dL (ref 13.0–17.0)
Immature Granulocytes: 0 %
Lymphocytes Relative: 25 %
Lymphs Abs: 0.8 10*3/uL (ref 0.7–4.0)
MCH: 32.2 pg (ref 26.0–34.0)
MCHC: 34.8 g/dL (ref 30.0–36.0)
MCV: 92.4 fL (ref 80.0–100.0)
Monocytes Absolute: 0.3 10*3/uL (ref 0.1–1.0)
Monocytes Relative: 9 %
Neutro Abs: 2.2 10*3/uL (ref 1.7–7.7)
Neutrophils Relative %: 66 %
Platelet Count: 197 10*3/uL (ref 150–400)
RBC: 4.6 MIL/uL (ref 4.22–5.81)
RDW: 12.8 % (ref 11.5–15.5)
WBC Count: 3.4 10*3/uL — ABNORMAL LOW (ref 4.0–10.5)
nRBC: 0 % (ref 0.0–0.2)

## 2023-01-19 LAB — CMP (CANCER CENTER ONLY)
ALT: 24 U/L (ref 0–44)
AST: 24 U/L (ref 15–41)
Albumin: 4.6 g/dL (ref 3.5–5.0)
Alkaline Phosphatase: 81 U/L (ref 38–126)
Anion gap: 8 (ref 5–15)
BUN: 16 mg/dL (ref 6–20)
CO2: 25 mmol/L (ref 22–32)
Calcium: 9.5 mg/dL (ref 8.9–10.3)
Chloride: 107 mmol/L (ref 98–111)
Creatinine: 0.96 mg/dL (ref 0.61–1.24)
GFR, Estimated: >60 mL/min (ref >60)
Glucose, Bld: 139 mg/dL — ABNORMAL HIGH (ref 70–99)
Potassium: 3.8 mmol/L (ref 3.5–5.1)
Sodium: 140 mmol/L (ref 135–145)
Total Bilirubin: 0.6 mg/dL (ref 0.3–1.2)
Total Protein: 6.7 g/dL (ref 6.5–8.1)

## 2023-01-19 LAB — LACTATE DEHYDROGENASE: LDH: 160 U/L (ref 98–192)

## 2023-01-19 NOTE — Progress Notes (Signed)
HEMATOLOGY/ONCOLOGY CLINIC VISIT NOTE  Date of Service: 01/19/2023  Patient Care Team: [name], MD as PCP - General (Family Medicine)  CHIEF COMPLAINTS/PURPOSE OF CONSULTATION:   follow-up for continued evaluation and management of CLL  HISTORY OF PRESENTING ILLNESS:  See previous note for details on initial presentation.  Interval History:  Jordan Schroeder is a 42 y.o. male is here today for continued evaluation and management of his CLL.  Patient was last seen by me on 11/15/2022 and reported an intentional 5-pound weight loss and was doing well overall with no new medical concerns.  Today, he reports that he regularly takes Venetoclax 300 mg and has tolerated it well with no new or severe toxicities.    He denies any fever, chills, night sweats, skin rashes, back pain, new lump/bumps, SOB, abdominal pain, changes in bowel habits, leg swelling, or concerns with shingles. No other acute new symptoms. He reports normal energy levels and continues to use a CPAP machine every night. Patient denies any major new medication changes. He regularly stays hydrated.   MEDICAL HISTORY:  Past Medical History:  Diagnosis Date   High cholesterol    Hypertension    Leukemia (HCC)     SURGICAL HISTORY: No past surgical history on file.  SOCIAL HISTORY: Social History   Socioeconomic History   Marital status: Married    Spouse name: Not on file   Number of children: Not on file   Years of education: Not on file   Highest education level: Not on file  Occupational History   Not on file  Tobacco Use   Smoking status: Never   Smokeless tobacco: Never  Vaping Use   Vaping status: Never Used  Substance and Sexual Activity   Alcohol use: Not Currently   Drug use: Never   Sexual activity: Not on file  Other Topics Concern   Not on file  Social History Narrative   Not on file   Social Determinants of Health   Financial Resource Strain: Not on file  Food Insecurity: Not  on file  Transportation Needs: Not on file  Physical Activity: Not on file  Stress: Not on file  Social Connections: Unknown (11/16/2021)   Received from [facility]   Social Network    Social Network: Not on file  Intimate Partner Violence: Unknown (10/08/2021)   Received from [facility]   HITS    Physically Hurt: Not on file    Insult or Talk Down To: Not on file    Threaten Physical Harm: Not on file    Scream or Curse: Not on file    FAMILY HISTORY: No family history on file.  ALLERGIES:  is allergic to gazyva [obinutuzumab] and rituximab.  MEDICATIONS:  Current Outpatient Medications  Medication Sig Dispense Refill   acyclovir (ZOVIRAX) 400 MG tablet Take 1 tablet (400 mg total) by mouth daily. (Patient not taking: Reported on 08/13/2019) 30 tablet 3   allopurinol (ZYLOPRIM) 100 MG tablet TAKE 1 TABLET BY MOUTH TWICE A DAY 180 tablet 1   azithromycin (ZITHROMAX Z-PAK) 250 MG tablet 2 tabs (500mg ) on day 1 and then 1 tab (250mg ) PO daily for 4 days 6 each 0   fenofibrate micronized (LOFIBRA) 67 MG capsule Take 67 mg by mouth daily before breakfast.     lisinopril (PRINIVIL,ZESTRIL) 10 MG tablet Take 10 mg by mouth daily.     ondansetron (ZOFRAN-ODT) 4 MG disintegrating tablet 4mg  ODT q4 hours prn nausea/vomit 10 tablet 0  sertraline (ZOLOFT) 100 MG tablet Take 100 mg by mouth daily.     venetoclax (VENCLEXTA) 100 MG tablet Take 3 tablets (300 mg total) by mouth daily. Tablets should be swallowed whole with a meal and a full glass of water. 90 tablet 1   VITAMIN D PO Take 2,000 Units by mouth.     No current facility-administered medications for this visit.    REVIEW OF SYSTEMS:    10 Point review of Systems was done is negative except as noted above.   PHYSICAL EXAMINATION: .There were no vitals taken for this visit.   GENERAL:alert, in no acute distress and comfortable SKIN: no acute rashes, no significant lesions EYES: conjunctiva are pink and non-injected,  sclera anicteric OROPHARYNX: MMM, no exudates, no oropharyngeal erythema or ulceration NECK: supple, no JVD LYMPH:  no palpable lymphadenopathy in the cervical, axillary or inguinal regions LUNGS: clear to auscultation b/l with normal respiratory effort HEART: regular rate & rhythm ABDOMEN:  normoactive bowel sounds , non tender, not distended. Extremity: no pedal edema PSYCH: alert & oriented x 3 with fluent speech NEURO: no focal motor/sensory deficits   LABORATORY DATA:       Latest Ref Rng & Units 11/15/2022   12:47 PM 08/23/2022   11:49 AM 06/07/2022    2:58 PM  CBC  WBC 4.0 - 10.5 K/uL 4.1  3.8  2.7   Hemoglobin 13.0 - 17.0 g/dL 16.0  73.7  10.6   Hematocrit 39.0 - 52.0 % 43.5  41.8  38.6   Platelets 150 - 400 K/uL 194  273  209         Latest Ref Rng & Units 11/15/2022   12:47 PM 08/23/2022   11:49 AM 06/07/2022    2:58 PM  CMP  Glucose 70 - 99 mg/dL 95  94  96   BUN 6 - 20 mg/dL 14  13  12    Creatinine 0.61 - 1.24 mg/dL 2.69  4.85  4.62   Sodium 135 - 145 mmol/L 142  140  137   Potassium 3.5 - 5.1 mmol/L 3.9  4.6  3.6   Chloride 98 - 111 mmol/L 106  105  103   CO2 22 - 32 mmol/L 30  30  27    Calcium 8.9 - 10.3 mg/dL 9.8  9.4  9.6   Total Protein 6.5 - 8.1 g/dL 6.7  6.7  6.2   Total Bilirubin 0.3 - 1.2 mg/dL 0.6  0.4  0.7   Alkaline Phos 38 - 126 U/L 72  68  71   AST 15 - 41 U/L 64  22  23   ALT 0 - 44 U/L 87  20  26    . Lab Results  Component Value Date   LDH 169 11/15/2022   08/25/2021 Uric acid 3.8    Component     Latest Ref Rng & Units 10/30/2017  HIV Screen 4th Generation wRfx     Non Reactive Non Reactive  HCV Ab     0.0 - 0.9 s/co ratio <0.1  Hepatitis B Surface Ag     Negative Negative  Hep B Core Ab, Tot     Negative Negative      10/25/17 Tissue Flow Cytometry:    10/25/17 Needle/core Bx:   [facility]  Order: [id number]  Status:  Final result   Visible to patient:  No (Not Released) Next appt:  11/07/2017 at 09:00 AM in  Radiology ([facility] PET) Dx:  Lymphocytosis  Component  24mo ago  Specimen Type Comment:   Comment: BLOOD  Cells Counted 200   Cells Analyzed 200   FISH Result Comment:   Comment: NORMAL:  NO CYCLIN D1 OR IGH GENE REARRANGEMENT OBSERVED  Interpretation Comment:   Comment: (NOTE)               nuc ish 11q13(CCND1x2),14q32(IGHx2)[200]       The fluorescence in situ hybridization (FISH) result  was normal. Dual color dual fusion DNA FISH probes targeting  the cyclin D1 gene (CCND1 or BCL1)([employer] and the  heavy chain immunoglobulin gene (IgH) at 14q32 showed two  hybridization signals each with no fusion in all cells  examined. Thus, there was NO apparent rearrangement of the  primary genes involved with mantle cell lymphoma, and to a  lesser extent myeloma.  Marland Kitchen         RADIOGRAPHIC STUDIES: I have personally reviewed the radiological images as listed and agreed with the findings in the report. No results found. CT chest abdomen pelvis 03/26/2021: IMPRESSION: 1. Interval development of bulky lymphadenopathy in the chest, abdomen, and pelvis. Imaging features compatible with recurrent lymphoma. 2. No hepatosplenomegaly. 3. Enlargement of the pulmonary outflow tract and main pulmonary arteries suggests pulmonary arterial hypertension. 4. 2-3 mm left upper lobe pulmonary nodule was largely obscured on the prior study. Most likely benign, attention on follow-up recommended.     Electronically Signed   By: Kennith Center M.D.  CT neck 03/26/2021 IMPRESSION: 1. Limited direct comparison to prior PET-CT (no prior neck CTs) with possible slight increase in size of a few left submandibular nodes. Otherwise, similar increased number of lymph nodes throughout the neck without substantial change in multiple mildly enlarged nodes (detailed above). 2. Oval 2.1 x 1.3 cm mass in the posterior right nasal cavity, nonspecific but potentially a polyp or retention cyst. Recommend correlation  with direct inspection.     ASSESSMENT & PLAN:   42 y.o. male with  1.  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 Noted 13q deletion: 13q14.3 is at 47.67% for normal nuclei, and 35.67% with mono allelic deletion of [id number].  -ATM for 11q22.3 revealed 81.67% with normal nuclei and 18.33% with positive nuclei for del 11q22.3   02/26/18 CT C/A/P revealed  Marked response to therapy as evidenced by decrease in size or resolution of adenopathy throughout chest, abdomen and pelvis. 2. Hepatic steatosis. Slight marginal irregularity suggests cirrhosis. 3. Enlarged pulmonary arteries, indicative of pulmonary arterial hypertension.   S/p 6 cycles of BR completed on 04/19/18 for first-line treatment  06/04/18 CT C/A/P revealed Stable CT chest without thoracic lymphadenopathy. Continued further decrease in abdominal and pelvic lymphadenopathy. No new or progressive lymphadenopathy on today's exam. 2. Hepatic steatosis. Subtle nodularity of liver contour raises the question of cirrhosis. 3. Pulmonary arterial enlargement. Pulmonary arterial hypertension a distinct concern.  2. H/o Rituxan infusion reaction-  -Had significant flushing with rituxan needing steroids. Not a candidate in future for rapid infusion protocol.  3.  History of some rigors and flushing with Gazyva controlled with adding Demerol and adjusting premedications.  4. Sleep apnea Notes he had a sleep study since his last clinic visit and diagnosis was confirmed. -uses CPAP  5. history of influenza A  6.  Mildly elevated transaminases less than 2 times upper limit of normal likely related to his recent influenza A and Tamiflu.  We will monitor-now resolved  PLAN:     -  Discussed lab results on 01/19/2023 in detail with patient. CBC stable showed WBC of 3.4K, hemoglobin of 14.8, and platelets of 197K -normal lymphocytes and neutrophils -CMP normal -LDH pending,  anticipate normal findings -11/07/2022 CT chest/abd/pelvis scan showed continued response with no new disease -due to presence of 11q deletion, would recommend to plan for 300 mg daily Venetoclax long-term as opposed to only taking it for a  fixed duration to delay recurrence of disease, which patient is agreeable with -No notable toxicities from venetoclax 300 mg p.o. daily. -Continue Venetoclax at 300 mg daily -No evidence of CLL progression at this time.  -continue to drink at least 2L of water daily -will continue to monitor 2-3 months   FOLLOW-UP: RTC with Dr Candise Che with labs in 10 weeks  The total time spent in the appointment was *** minutes* .  All of the patient's questions were answered with apparent satisfaction. The patient knows to call the clinic with any problems, questions or concerns.   Wyvonnia Lora MD MS AAHIVMS [facility] Hematology/Oncology Physician [facility]  .*Total Encounter Time as defined by the Centers for Medicare and Medicaid Services includes, in addition to the face-to-face time of a patient visit (documented in the note above) non-face-to-face time: obtaining and reviewing outside history, ordering and reviewing medications, tests or procedures, care coordination (communications with other health care professionals or caregivers) and documentation in the medical record.    I,Mitra Faeizi,acting as a Neurosurgeon for Wyvonnia Lora, MD.,have documented all relevant documentation on the behalf of Wyvonnia Lora, MD,as directed by  Wyvonnia Lora, MD while in the presence of Wyvonnia Lora, MD.  ***

## 2023-01-20 ENCOUNTER — Ambulatory Visit: Payer: BLUE CROSS/BLUE SHIELD | Admitting: Hematology

## 2023-01-20 ENCOUNTER — Other Ambulatory Visit: Payer: BLUE CROSS/BLUE SHIELD

## 2023-01-25 ENCOUNTER — Encounter: Payer: Self-pay | Admitting: Hematology

## 2023-01-25 ENCOUNTER — Other Ambulatory Visit: Payer: Self-pay

## 2023-01-30 DIAGNOSIS — G4733 Obstructive sleep apnea (adult) (pediatric): Secondary | ICD-10-CM | POA: Diagnosis not present

## 2023-02-01 IMAGING — CR DG CHEST 2V
2 series · 2 of 2 positions shown · non-contrast
Comparison: Chest x-ray dated 11/07/2017.

CLINICAL DATA: Cough, infusion reaction.  Leukemia.

EXAM:
CHEST - 2 VIEW

[w chest pa]
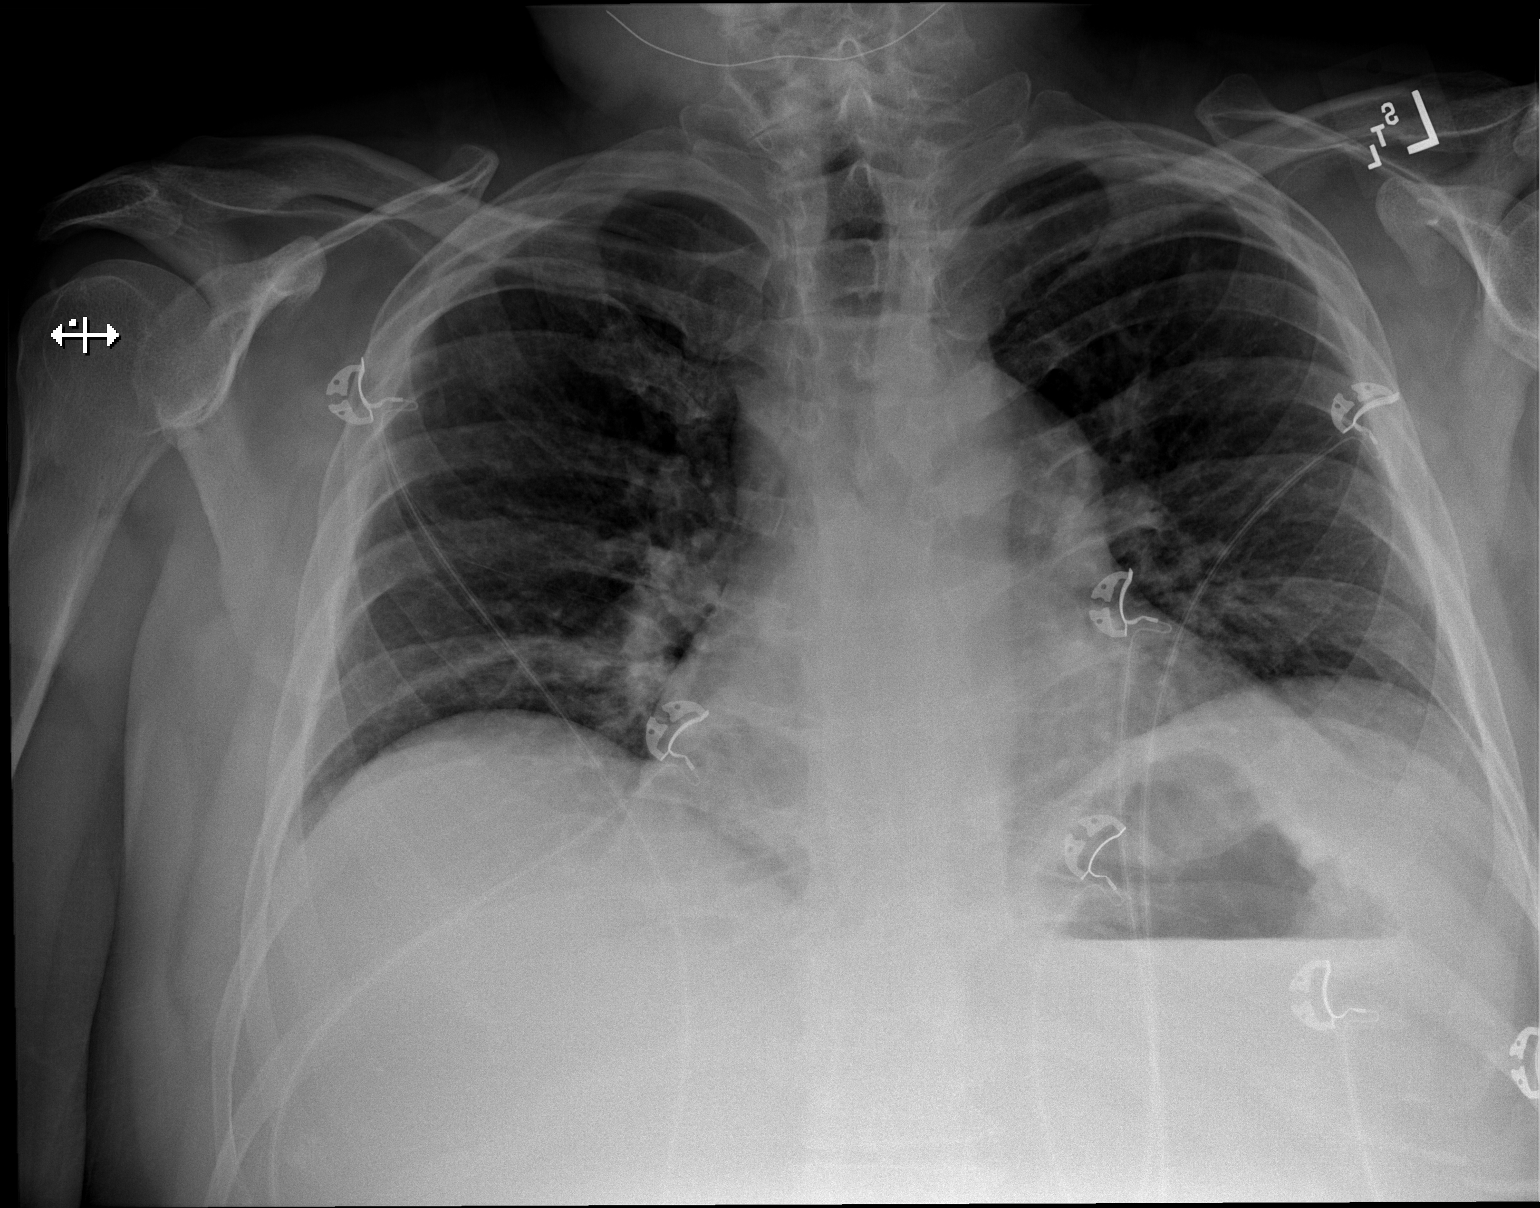

[w chest lat]
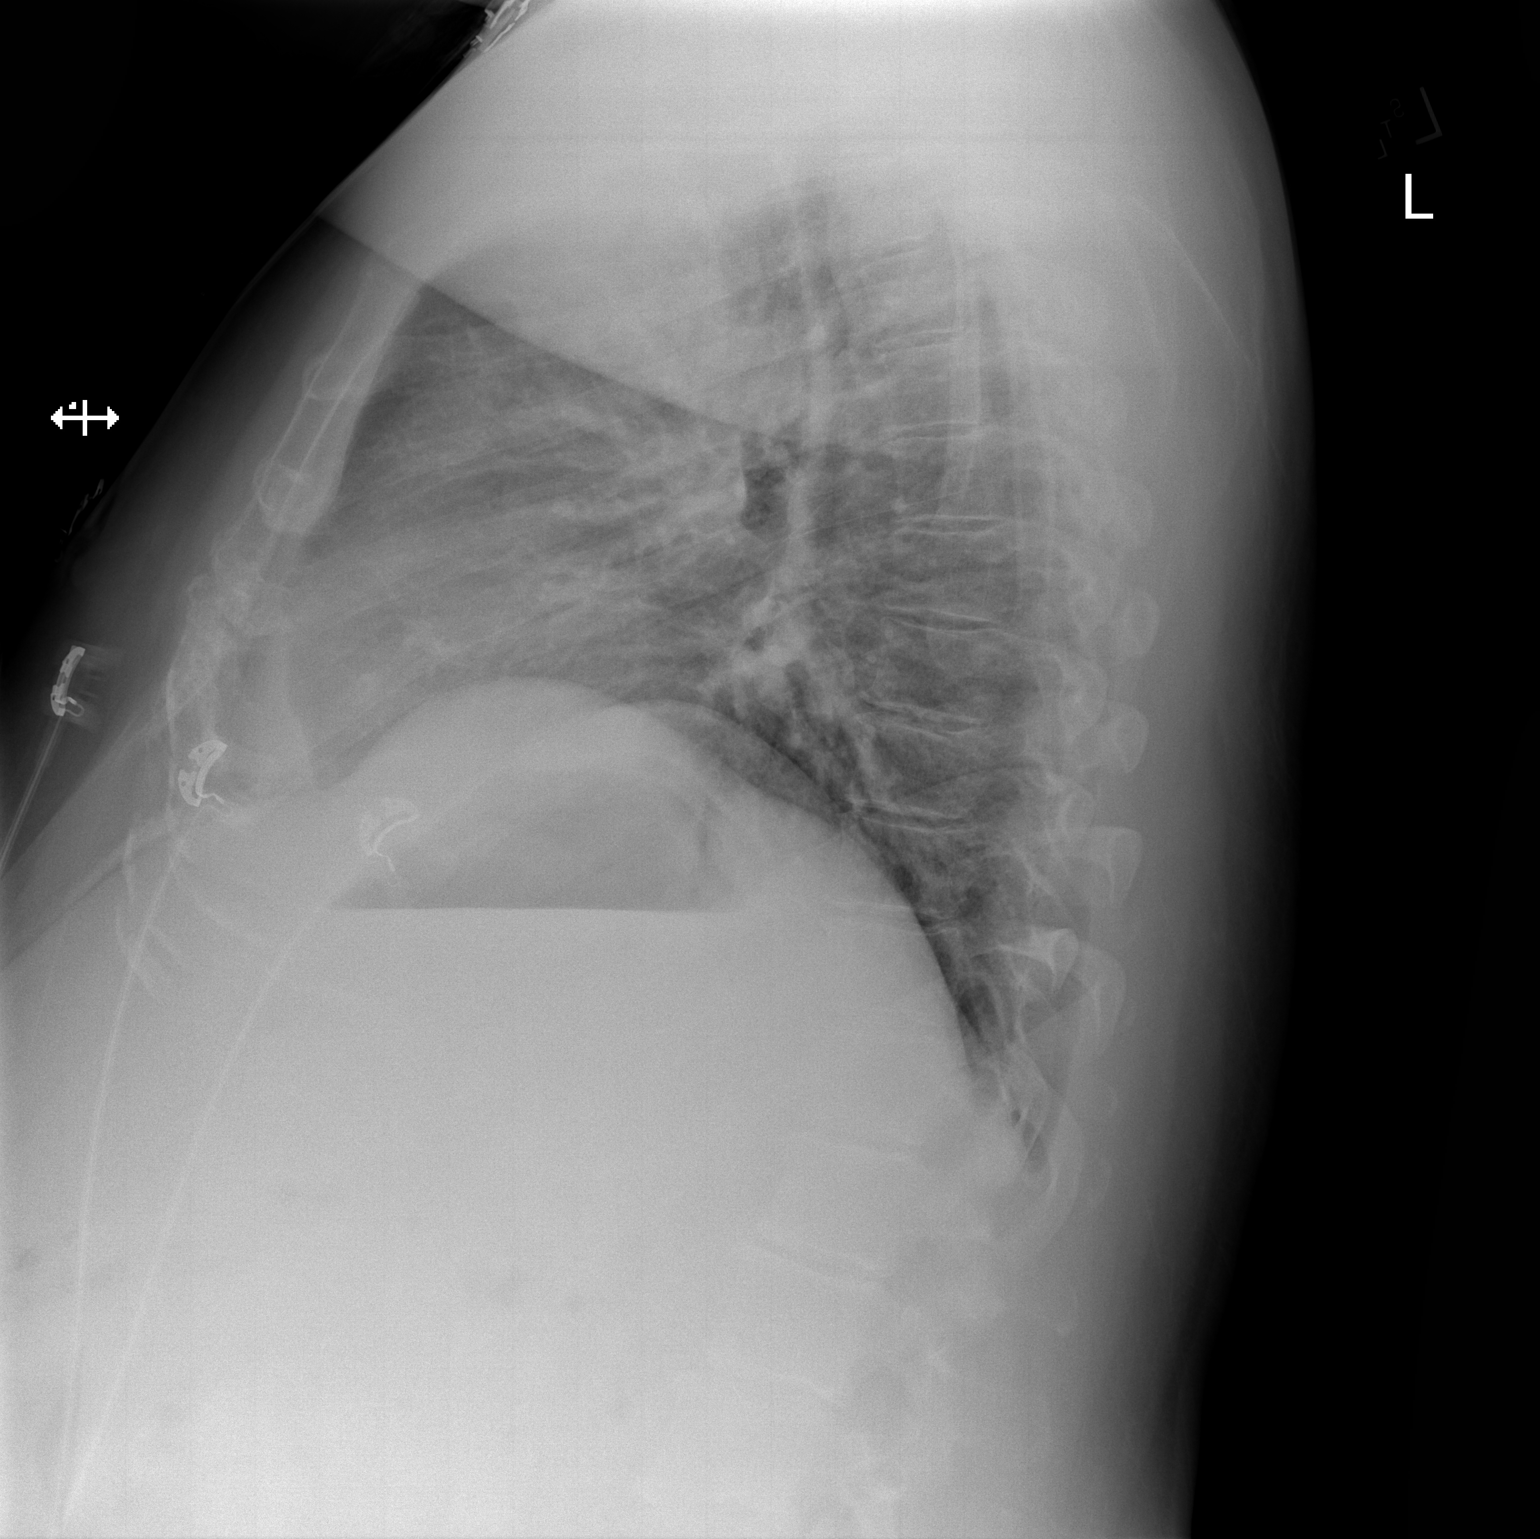

[2 of 2 positions shown; findings below may reference images not displayed]

FINDINGS: Study is hypoinspiratory with crowding of the perihilar and
bibasilar bronchovascular markings. Given the low lung volumes,
lungs appear clear. No pleural effusion or pneumothorax is seen.
Heart size and mediastinal contours are within normal limits.
Osseous structures about the chest are unremarkable.
IMPRESSION: Low lung volumes. No active cardiopulmonary disease. No evidence of
pneumonia or pulmonary edema.

## 2023-02-03 ENCOUNTER — Other Ambulatory Visit: Payer: Self-pay

## 2023-02-03 ENCOUNTER — Other Ambulatory Visit: Payer: Self-pay | Admitting: Hematology

## 2023-02-03 ENCOUNTER — Other Ambulatory Visit (HOSPITAL_COMMUNITY): Payer: Self-pay

## 2023-02-03 MED ORDER — VENETOCLAX 100 MG PO TABS
300.0000 mg | ORAL_TABLET | Freq: Every day | ORAL | 1 refills | Status: DC
  Filled 2023-02-03: qty 84, 28d supply, fill #0
  Filled 2023-03-01: qty 84, 28d supply, fill #1

## 2023-02-06 ENCOUNTER — Other Ambulatory Visit (HOSPITAL_COMMUNITY): Payer: Self-pay

## 2023-03-01 ENCOUNTER — Encounter: Payer: Self-pay | Admitting: Hematology

## 2023-03-01 ENCOUNTER — Other Ambulatory Visit (HOSPITAL_COMMUNITY): Payer: Self-pay

## 2023-03-02 ENCOUNTER — Other Ambulatory Visit (HOSPITAL_COMMUNITY): Payer: Self-pay

## 2023-03-23 ENCOUNTER — Other Ambulatory Visit: Payer: Self-pay

## 2023-03-23 ENCOUNTER — Other Ambulatory Visit (HOSPITAL_COMMUNITY): Payer: Self-pay

## 2023-03-23 ENCOUNTER — Other Ambulatory Visit: Payer: Self-pay | Admitting: Hematology

## 2023-03-23 MED ORDER — VENETOCLAX 100 MG PO TABS
300.0000 mg | ORAL_TABLET | Freq: Every day | ORAL | 1 refills | Status: DC
  Filled 2023-03-23: qty 84, 28d supply, fill #0
  Filled 2023-04-26: qty 84, 28d supply, fill #1

## 2023-03-23 MED ORDER — VENETOCLAX 100 MG PO TABS
300.0000 mg | ORAL_TABLET | Freq: Every day | ORAL | 1 refills | Status: DC
  Filled 2023-03-23: qty 90, 30d supply, fill #0

## 2023-03-28 ENCOUNTER — Other Ambulatory Visit (HOSPITAL_COMMUNITY): Payer: Self-pay

## 2023-03-28 ENCOUNTER — Other Ambulatory Visit: Payer: Self-pay

## 2023-03-28 DIAGNOSIS — C9110 Chronic lymphocytic leukemia of B-cell type not having achieved remission: Secondary | ICD-10-CM

## 2023-03-28 NOTE — Progress Notes (Signed)
Specialty Pharmacy Ongoing Clinical Assessment Note  Jordan Schroeder is a 42 y.o. male who is being followed by the specialty pharmacy service for [facility] Oncology   Review of patient's specialty medication(s) Venetoclax  occurred today.   Patient has missed 0  doses in the last 4 weeks.   Patient did not have any additional questions or concerns.   Therapeutic benefit summary: Patient is achieving benefit   Adverse events/side effects summary: No adverse events/side effects   Patient's therapy is appropriate to : Continue    Goals      Achieve or maintain remission     Patient is on track . Patient will maintain adherence         Follow up:  6 months

## 2023-03-29 ENCOUNTER — Inpatient Hospital Stay: Payer: BLUE CROSS/BLUE SHIELD | Admitting: Hematology

## 2023-03-29 ENCOUNTER — Other Ambulatory Visit: Payer: Self-pay

## 2023-03-29 ENCOUNTER — Inpatient Hospital Stay: Payer: BLUE CROSS/BLUE SHIELD | Attending: Hematology

## 2023-03-29 VITALS — BP 112/76 | HR 88 | Temp 98.2°F | Resp 18 | Wt 272.6 lb

## 2023-03-29 DIAGNOSIS — Z79624 Long term (current) use of inhibitors of nucleotide synthesis: Secondary | ICD-10-CM | POA: Insufficient documentation

## 2023-03-29 DIAGNOSIS — Z7969 Long term (current) use of other immunomodulators and immunosuppressants: Secondary | ICD-10-CM | POA: Insufficient documentation

## 2023-03-29 DIAGNOSIS — C9112 Chronic lymphocytic leukemia of B-cell type in relapse: Secondary | ICD-10-CM | POA: Insufficient documentation

## 2023-03-29 DIAGNOSIS — C9110 Chronic lymphocytic leukemia of B-cell type not having achieved remission: Secondary | ICD-10-CM | POA: Insufficient documentation

## 2023-03-29 DIAGNOSIS — Z79899 Other long term (current) drug therapy: Secondary | ICD-10-CM | POA: Insufficient documentation

## 2023-03-29 LAB — CMP (CANCER CENTER ONLY)
ALT: 38 U/L (ref 0–44)
AST: 33 U/L (ref 15–41)
Albumin: 4.8 g/dL (ref 3.5–5.0)
Alkaline Phosphatase: 81 U/L (ref 38–126)
Anion gap: 6 (ref 5–15)
BUN: 14 mg/dL (ref 6–20)
CO2: 27 mmol/L (ref 22–32)
Calcium: 9.8 mg/dL (ref 8.9–10.3)
Chloride: 108 mmol/L (ref 98–111)
Creatinine: 0.92 mg/dL (ref 0.61–1.24)
GFR, Estimated: >60 mL/min (ref >60)
Glucose, Bld: 99 mg/dL (ref 70–99)
Potassium: 4.1 mmol/L (ref 3.5–5.1)
Sodium: 141 mmol/L (ref 135–145)
Total Bilirubin: 0.6 mg/dL (ref 0.3–1.2)
Total Protein: 6.9 g/dL (ref 6.5–8.1)

## 2023-03-29 LAB — CBC WITH DIFFERENTIAL (CANCER CENTER ONLY)
Abs Immature Granulocytes: 0.01 10*3/uL (ref 0.00–0.07)
Basophils Absolute: 0 10*3/uL (ref 0.0–0.1)
Basophils Relative: 0 %
Eosinophils Absolute: 0 10*3/uL (ref 0.0–0.5)
Eosinophils Relative: 0 %
HCT: 42.7 % (ref 39.0–52.0)
Hemoglobin: 14.7 g/dL (ref 13.0–17.0)
Immature Granulocytes: 0 %
Lymphocytes Relative: 18 %
Lymphs Abs: 0.9 10*3/uL (ref 0.7–4.0)
MCH: 32.2 pg (ref 26.0–34.0)
MCHC: 34.4 g/dL (ref 30.0–36.0)
MCV: 93.4 fL (ref 80.0–100.0)
Monocytes Absolute: 0.8 10*3/uL (ref 0.1–1.0)
Monocytes Relative: 15 %
Neutro Abs: 3.4 10*3/uL (ref 1.7–7.7)
Neutrophils Relative %: 67 %
Platelet Count: 232 10*3/uL (ref 150–400)
RBC: 4.57 MIL/uL (ref 4.22–5.81)
RDW: 13.1 % (ref 11.5–15.5)
WBC Count: 5.1 10*3/uL (ref 4.0–10.5)
nRBC: 0 % (ref 0.0–0.2)

## 2023-03-29 LAB — LACTATE DEHYDROGENASE: LDH: 152 U/L (ref 98–192)

## 2023-03-29 NOTE — Progress Notes (Signed)
HEMATOLOGY/ONCOLOGY CLINIC VISIT NOTE  Date of Service: 03/29/2023  Patient Care Team: [name], MD as PCP - General (Family Medicine)  CHIEF COMPLAINTS/PURPOSE OF CONSULTATION:   follow-up for continued evaluation and management of CLL  HISTORY OF PRESENTING ILLNESS:  See previous note for details on initial presentation.  Interval History:  Jordan Schroeder is a 42 y.o. male is here today for continued evaluation and management of his CLL.  Patient was last seen by me on 01/19/2023 and was doing well overall with no new medical concerns.   Today, he reports that he has been doing well overall since his last clinical visit. Patient has been tolerating 300 MG Venetoclax with no new or severe toxicity issues. He denies any new lumps/bumps, abdominal pain, fever, chills, night sweats, skin rashes, shingles, back pain, change in bowel habits, or infections. Patient is planning to receive the flu shot soon.   MEDICAL HISTORY:  Past Medical History:  Diagnosis Date   High cholesterol    Hypertension    Leukemia (HCC)     SURGICAL HISTORY: No past surgical history on file.  SOCIAL HISTORY: Social History   Socioeconomic History   Marital status: Married    Spouse name: Not on file   Number of children: Not on file   Years of education: Not on file   Highest education level: Not on file  Occupational History   Not on file  Tobacco Use   Smoking status: Never   Smokeless tobacco: Never  Vaping Use   Vaping status: Never Used  Substance and Sexual Activity   Alcohol use: Not Currently   Drug use: Never   Sexual activity: Not on file  Other Topics Concern   Not on file  Social History Narrative   Not on file   Social Determinants of Health   Financial Resource Strain: Not on file  Food Insecurity: Not on file  Transportation Needs: Not on file  Physical Activity: Not on file  Stress: Not on file  Social Connections: Unknown (11/16/2021)   Received from  [facility], [facility]   Social Network    Social Network: Not on file  Intimate Partner Violence: Unknown (10/08/2021)   Received from [facility], [facility]   HITS    Physically Hurt: Not on file    Insult or Talk Down To: Not on file    Threaten Physical Harm: Not on file    Scream or Curse: Not on file    FAMILY HISTORY: No family history on file.  ALLERGIES:  is allergic to gazyva [obinutuzumab] and rituximab.  MEDICATIONS:  Current Outpatient Medications  Medication Sig Dispense Refill   acyclovir (ZOVIRAX) 400 MG tablet Take 1 tablet (400 mg total) by mouth daily. (Patient not taking: Reported on 08/13/2019) 30 tablet 3   allopurinol (ZYLOPRIM) 100 MG tablet TAKE 1 TABLET BY MOUTH TWICE A DAY 180 tablet 1   azithromycin (ZITHROMAX Z-PAK) 250 MG tablet 2 tabs (500mg ) on day 1 and then 1 tab (250mg ) PO daily for 4 days 6 each 0   fenofibrate micronized (LOFIBRA) 67 MG capsule Take 67 mg by mouth daily before breakfast.     lisinopril (PRINIVIL,ZESTRIL) 10 MG tablet Take 10 mg by mouth daily.     ondansetron (ZOFRAN-ODT) 4 MG disintegrating tablet 4mg  ODT q4 hours prn nausea/vomit 10 tablet 0   sertraline (ZOLOFT) 100 MG tablet Take 100 mg by mouth daily.     venetoclax (VENCLEXTA) 100 MG tablet  Take 3 tablets (300 mg total) by mouth daily. Tablets should be swallowed whole with a meal and a full glass of water. 84 tablet 1   VITAMIN D PO Take 2,000 Units by mouth.     No current facility-administered medications for this visit.    REVIEW OF SYSTEMS:    10 Point review of Systems was done is negative except as noted above.   PHYSICAL EXAMINATION: .BP 112/76 (BP Location: Left Arm, Patient Position: Sitting)   Pulse 88   Temp 98.2 F (36.8 C) (Oral)   Resp 18   Wt 272 lb 9.6 oz (123.7 kg)   SpO2 97%   BMI 41.45 kg/m  GENERAL:alert, in no acute distress and comfortable SKIN: no acute rashes, no significant lesions EYES: conjunctiva are pink and non-injected,  sclera anicteric OROPHARYNX: MMM, no exudates, no oropharyngeal erythema or ulceration NECK: supple, no JVD LYMPH:  no palpable lymphadenopathy in the cervical, axillary or inguinal regions LUNGS: clear to auscultation b/l with normal respiratory effort HEART: regular rate & rhythm ABDOMEN:  normoactive bowel sounds , non tender, not distended. Extremity: no pedal edema PSYCH: alert & oriented x 3 with fluent speech NEURO: no focal motor/sensory deficits   LABORATORY DATA:       Latest Ref Rng & Units 03/29/2023   10:41 AM 01/19/2023   12:09 PM 11/15/2022   12:47 PM  CBC  WBC 4.0 - 10.5 K/uL 5.1  3.4  4.1   Hemoglobin 13.0 - 17.0 g/dL 38.7  56.4  33.2   Hematocrit 39.0 - 52.0 % 42.7  42.5  43.5   Platelets 150 - 400 K/uL 232  197  194         Latest Ref Rng & Units 03/29/2023   10:41 AM 01/19/2023   12:09 PM 11/15/2022   12:47 PM  CMP  Glucose 70 - 99 mg/dL 99  951  95   BUN 6 - 20 mg/dL 14  16  14    Creatinine 0.61 - 1.24 mg/dL 8.84  1.66  0.63   Sodium 135 - 145 mmol/L 141  140  142   Potassium 3.5 - 5.1 mmol/L 4.1  3.8  3.9   Chloride 98 - 111 mmol/L 108  107  106   CO2 22 - 32 mmol/L 27  25  30    Calcium 8.9 - 10.3 mg/dL 9.8  9.5  9.8   Total Protein 6.5 - 8.1 g/dL 6.9  6.7  6.7   Total Bilirubin 0.3 - 1.2 mg/dL 0.6  0.6  0.6   Alkaline Phos 38 - 126 U/L 81  81  72   AST 15 - 41 U/L 33  24  64   ALT 0 - 44 U/L 38  24  87    . Lab Results  Component Value Date   LDH 152 03/29/2023   08/25/2021 Uric acid 3.8  Component     Latest Ref Rng & Units 10/30/2017  HIV Screen 4th Generation wRfx     Non Reactive Non Reactive  HCV Ab     0.0 - 0.9 s/co ratio <0.1  Hepatitis B Surface Ag     Negative Negative  Hep B Core Ab, Tot     Negative Negative      10/25/17 Tissue Flow Cytometry:    10/25/17 Needle/core Bx:   [facility] On[facility]  Order: [id number]  Status:  Final result   Visible to patient:  No (Not Released) Next appt:  11/07/2017 at 09:00 AM  in Radiology  ([facility] PET) Dx:  Lymphocytosis  Component 28mo ago  Specimen Type Comment:   Comment: BLOOD  Cells Counted 200   Cells Analyzed 200   FISH Result Comment:   Comment: NORMAL:  NO CYCLIN D1 OR IGH GENE REARRANGEMENT OBSERVED  Interpretation Comment:   Comment: (NOTE)               nuc ish 11q13(CCND1x2),14q32(IGHx2)[200]       The fluorescence in situ hybridization (FISH) result  was normal. Dual color dual fusion DNA FISH probes targeting  the cyclin D1 gene (CCND1 or BCL1)([employer] and the  heavy chain immunoglobulin gene (IgH) at 14q32 showed two  hybridization signals each with no fusion in all cells  examined. Thus, there was NO apparent rearrangement of the  primary genes involved with mantle cell lymphoma, and to a  lesser extent myeloma.  Marland Kitchen         RADIOGRAPHIC STUDIES: I have personally reviewed the radiological images as listed and agreed with the findings in the report. No results found. CT chest abdomen pelvis 03/26/2021: IMPRESSION: 1. Interval development of bulky lymphadenopathy in the chest, abdomen, and pelvis. Imaging features compatible with recurrent lymphoma. 2. No hepatosplenomegaly. 3. Enlargement of the pulmonary outflow tract and main pulmonary arteries suggests pulmonary arterial hypertension. 4. 2-3 mm left upper lobe pulmonary nodule was largely obscured on the prior study. Most likely benign, attention on follow-up recommended.     Electronically Signed   By: Kennith Center M.D.  CT neck 03/26/2021 IMPRESSION: 1. Limited direct comparison to prior PET-CT (no prior neck CTs) with possible slight increase in size of a few left submandibular nodes. Otherwise, similar increased number of lymph nodes throughout the neck without substantial change in multiple mildly enlarged nodes (detailed above). 2. Oval 2.1 x 1.3 cm mass in the posterior right nasal cavity, nonspecific but potentially a polyp or retention cyst. Recommend correlation with  direct inspection.     ASSESSMENT & PLAN:   42 y.o. male with  1.  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 Noted 13q deletion: 13q14.3 is at 47.67% for normal nuclei, and 35.67% with mono allelic deletion of [id number].  -ATM for 11q22.3 revealed 81.67% with normal nuclei and 18.33% with positive nuclei for del 11q22.3   02/26/18 CT C/A/P revealed  Marked response to therapy as evidenced by decrease in size or resolution of adenopathy throughout chest, abdomen and pelvis. 2. Hepatic steatosis. Slight marginal irregularity suggests cirrhosis. 3. Enlarged pulmonary arteries, indicative of pulmonary arterial hypertension.   S/p 6 cycles of BR completed on 04/19/18 for first-line treatment  06/04/18 CT C/A/P revealed Stable CT chest without thoracic lymphadenopathy. Continued further decrease in abdominal and pelvic lymphadenopathy. No new or progressive lymphadenopathy on today's exam. 2. Hepatic steatosis. Subtle nodularity of liver contour raises the question of cirrhosis. 3. Pulmonary arterial enlargement. Pulmonary arterial hypertension a distinct concern.  2. H/o Rituxan infusion reaction-  -Had significant flushing with rituxan needing steroids. Not a candidate in future for rapid infusion protocol.  3.  History of some rigors and flushing with Gazyva controlled with adding Demerol and adjusting premedications.  4. Sleep apnea Notes he had a sleep study since his last clinic visit and diagnosis was confirmed. -uses CPAP  5. history of influenza A  6.  Mildly elevated transaminases less than 2 times upper limit of normal likely related to his recent influenza A and Tamiflu.  We will monitor-now resolved  PLAN:  -Discussed lab results on 03/29/23 in detail with patient. CBC normal, showed WBC of 5.1K, hemoglobin of 14.7, and platelets of 232K. -CMP normal -LDH normal -No notable toxicities from venetoclax 300 mg p.o.  daily. -Continue Venetoclax at 300 mg daily -No evidence of CLL progression at this time.  -continue to drink at least 2L of water daily -recommend patient to stay UTD with age-appropriate vaccines including flu shot and pneumonia vaccine -Will continue to monitor with labs in 3 months  FOLLOW-UP: RTC with Dr Candise Che with labs in 3 months  The total time spent in the appointment was 21 minutes* .  All of the patient's questions were answered with apparent satisfaction. The patient knows to call the clinic with any problems, questions or concerns.   Wyvonnia Lora MD MS AAHIVMS [facility] Hematology/Oncology Physician [facility]  .*Total Encounter Time as defined by the Centers for Medicare and Medicaid Services includes, in addition to the face-to-face time of a patient visit (documented in the note above) non-face-to-face time: obtaining and reviewing outside history, ordering and reviewing medications, tests or procedures, care coordination (communications with other health care professionals or caregivers) and documentation in the medical record.    I,Mitra Faeizi,acting as a Neurosurgeon for Wyvonnia Lora, MD.,have documented all relevant documentation on the behalf of Wyvonnia Lora, MD,as directed by  Wyvonnia Lora, MD while in the presence of Wyvonnia Lora, MD.  .I have reviewed the above documentation for accuracy and completeness, and I agree with the above. Johney Maine MD

## 2023-03-31 ENCOUNTER — Telehealth: Payer: Self-pay | Admitting: Hematology

## 2023-03-31 NOTE — Telephone Encounter (Signed)
Patient is aware of scheduled appointment times/dates

## 2023-04-01 ENCOUNTER — Other Ambulatory Visit: Payer: Self-pay

## 2023-04-04 ENCOUNTER — Encounter: Payer: Self-pay | Admitting: Hematology

## 2023-04-26 ENCOUNTER — Other Ambulatory Visit: Payer: Self-pay

## 2023-04-26 NOTE — Progress Notes (Signed)
Specialty Pharmacy Refill Coordination Note  Jordan Schroeder is a 42 y.o. male contacted today regarding refills of specialty medication(s) Venetoclax   Patient requested Delivery   Delivery date: 05/05/23   Verified address: Patient address [address]  [place] [address]   Medication will be filled on 05/04/23.

## 2023-05-04 ENCOUNTER — Other Ambulatory Visit: Payer: Self-pay

## 2023-05-15 ENCOUNTER — Other Ambulatory Visit: Payer: Self-pay | Admitting: Physician Assistant

## 2023-05-26 ENCOUNTER — Other Ambulatory Visit: Payer: Self-pay

## 2023-05-26 ENCOUNTER — Other Ambulatory Visit: Payer: Self-pay | Admitting: Hematology

## 2023-05-26 MED ORDER — VENETOCLAX 100 MG PO TABS
300.0000 mg | ORAL_TABLET | Freq: Every day | ORAL | 1 refills | Status: DC
  Filled 2023-05-26: qty 84, 28d supply, fill #0
  Filled 2023-06-15: qty 84, 28d supply, fill #1

## 2023-05-26 NOTE — Progress Notes (Signed)
Specialty Pharmacy Refill Coordination Note  Jordan Schroeder is a 42 y.o. male contacted today regarding refills of specialty medication(s) Venetoclax   Patient requested Delivery   Delivery date: 05/30/23   Verified address: Patient address [address]  [place] [address]   Medication will be filled on 05/29/23.  Specialty Pharmacy Refill Coordination Note  Jordan Schroeder is a 42 y.o. male contacted today regarding refills of specialty medication(s) Venetoclax

## 2023-05-29 ENCOUNTER — Other Ambulatory Visit: Payer: Self-pay

## 2023-06-08 ENCOUNTER — Other Ambulatory Visit: Payer: Self-pay

## 2023-06-08 DIAGNOSIS — C9110 Chronic lymphocytic leukemia of B-cell type not having achieved remission: Secondary | ICD-10-CM

## 2023-06-09 ENCOUNTER — Inpatient Hospital Stay: Payer: BLUE CROSS/BLUE SHIELD

## 2023-06-09 ENCOUNTER — Inpatient Hospital Stay: Payer: BLUE CROSS/BLUE SHIELD | Admitting: Hematology

## 2023-06-09 ENCOUNTER — Other Ambulatory Visit: Payer: Self-pay

## 2023-06-15 ENCOUNTER — Other Ambulatory Visit: Payer: Self-pay

## 2023-06-15 NOTE — Progress Notes (Signed)
Specialty Pharmacy Refill Coordination Note  Jordan Schroeder is a 42 y.o. male contacted today regarding refills of specialty medication(s) Venetoclax [name])   Patient requested Delivery   Delivery date: 06/26/23   Verified address: Patient address [address]  [place] [address]   Medication will be filled on 06/23/23.

## 2023-06-23 ENCOUNTER — Other Ambulatory Visit: Payer: Self-pay

## 2023-06-23 ENCOUNTER — Other Ambulatory Visit (HOSPITAL_COMMUNITY): Payer: Self-pay

## 2023-06-23 ENCOUNTER — Telehealth: Payer: Self-pay | Admitting: Pharmacy Technician

## 2023-06-23 NOTE — Telephone Encounter (Signed)
Oral Oncology Patient Advocate Encounter  Prior Authorization for Jordan Schroeder has been approved.    PA# [id number] Effective dates: 06/23/23 through 06/21/24  Patients co-pay is $3,137.65.  Patient has an active copay car dto bring their cost down to $0    Jinger Neighbors, CPhT-Adv Oncology Pharmacy Patient Advocate [facility] Direct Number: [phone]  Fax: [phone]

## 2023-06-23 NOTE — Telephone Encounter (Signed)
Oral Oncology Patient Advocate Encounter   Received notification that prior authorization for Venclexta is due for renewal.   PA submitted on 06/23/23 Key [id number] Status is pending     Jinger Neighbors, CPhT-Adv Oncology Pharmacy Patient Advocate [facility] Direct Number: [phone]  Fax: [phone]

## 2023-06-27 ENCOUNTER — Inpatient Hospital Stay: Payer: BLUE CROSS/BLUE SHIELD | Attending: Hematology

## 2023-06-27 ENCOUNTER — Inpatient Hospital Stay (HOSPITAL_BASED_OUTPATIENT_CLINIC_OR_DEPARTMENT_OTHER): Payer: BLUE CROSS/BLUE SHIELD | Admitting: Hematology

## 2023-06-27 VITALS — BP 105/72 | HR 83 | Temp 98.1°F | Resp 17 | Wt 275.5 lb

## 2023-06-27 DIAGNOSIS — C9112 Chronic lymphocytic leukemia of B-cell type in relapse: Secondary | ICD-10-CM | POA: Insufficient documentation

## 2023-06-27 DIAGNOSIS — C9110 Chronic lymphocytic leukemia of B-cell type not having achieved remission: Secondary | ICD-10-CM | POA: Diagnosis not present

## 2023-06-27 DIAGNOSIS — G4730 Sleep apnea, unspecified: Secondary | ICD-10-CM | POA: Diagnosis not present

## 2023-06-27 DIAGNOSIS — R7401 Elevation of levels of liver transaminase levels: Secondary | ICD-10-CM | POA: Insufficient documentation

## 2023-06-27 LAB — CBC WITH DIFFERENTIAL (CANCER CENTER ONLY)
Abs Immature Granulocytes: 0.01 10*3/uL (ref 0.00–0.07)
Basophils Absolute: 0 10*3/uL (ref 0.0–0.1)
Basophils Relative: 1 %
Eosinophils Absolute: 0 10*3/uL (ref 0.0–0.5)
Eosinophils Relative: 0 %
HCT: 42.8 % (ref 39.0–52.0)
Hemoglobin: 15 g/dL (ref 13.0–17.0)
Immature Granulocytes: 0 %
Lymphocytes Relative: 22 %
Lymphs Abs: 0.9 10*3/uL (ref 0.7–4.0)
MCH: 32.1 pg (ref 26.0–34.0)
MCHC: 35 g/dL (ref 30.0–36.0)
MCV: 91.6 fL (ref 80.0–100.0)
Monocytes Absolute: 0.8 10*3/uL (ref 0.1–1.0)
Monocytes Relative: 19 %
Neutro Abs: 2.4 10*3/uL (ref 1.7–7.7)
Neutrophils Relative %: 58 %
Platelet Count: 221 10*3/uL (ref 150–400)
RBC: 4.67 MIL/uL (ref 4.22–5.81)
RDW: 12.9 % (ref 11.5–15.5)
WBC Count: 4.1 10*3/uL (ref 4.0–10.5)
nRBC: 0 % (ref 0.0–0.2)

## 2023-06-27 LAB — CMP (CANCER CENTER ONLY)
ALT: 35 U/L (ref 0–44)
AST: 34 U/L (ref 15–41)
Albumin: 4.4 g/dL (ref 3.5–5.0)
Alkaline Phosphatase: 68 U/L (ref 38–126)
Anion gap: 7 (ref 5–15)
BUN: 14 mg/dL (ref 6–20)
CO2: 24 mmol/L (ref 22–32)
Calcium: 9.4 mg/dL (ref 8.9–10.3)
Chloride: 110 mmol/L (ref 98–111)
Creatinine: 0.86 mg/dL (ref 0.61–1.24)
GFR, Estimated: >60 mL/min (ref >60)
Glucose, Bld: 97 mg/dL (ref 70–99)
Potassium: 4.1 mmol/L (ref 3.5–5.1)
Sodium: 141 mmol/L (ref 135–145)
Total Bilirubin: 0.6 mg/dL (ref <1.2)
Total Protein: 6.5 g/dL (ref 6.5–8.1)

## 2023-06-27 LAB — LACTATE DEHYDROGENASE: LDH: 155 U/L (ref 98–192)

## 2023-06-27 NOTE — Progress Notes (Signed)
HEMATOLOGY/ONCOLOGY CLINIC VISIT NOTE  Date of Service: 06/27/2023  Patient Care Team: Jordan Moccasin, MD as PCP - General (Family Medicine)  CHIEF COMPLAINTS/PURPOSE OF CONSULTATION:   follow-up for continued evaluation and management of CLL  HISTORY OF PRESENTING ILLNESS:  See previous note for details on initial presentation.  Interval History:  [name] is a 42 y.o. male is here today for continued evaluation and management of his CLL.  Patient was last seen by me on 03/29/2023 and was doing well overall.   Today, he reports that he has been doing well overall since his last clinical visit. Patient has been tolerating 300 MG Venetoclax well with no new or severe toxicities.   He denies any fever, chills, night sweats, infection issues, diarrhea, constipation, new lumps/bumps, abdominal pain, or urinary issues.  He denies any issues with shingles.  He denies any new medical concerns. Patient uses a CPAP every night and his energy levels have been stable.   MEDICAL HISTORY:  Past Medical History:  Diagnosis Date   High cholesterol    Hypertension    Leukemia (HCC)     SURGICAL HISTORY: No past surgical history on file.  SOCIAL HISTORY: Social History   Socioeconomic History   Marital status: Married    Spouse name: Not on file   Number of children: Not on file   Years of education: Not on file   Highest education level: Not on file  Occupational History   Not on file  Tobacco Use   Smoking status: Never   Smokeless tobacco: Never  Vaping Use   Vaping status: Never Used  Substance and Sexual Activity   Alcohol use: Not Currently   Drug use: Never   Sexual activity: Not on file  Other Topics Concern   Not on file  Social History Narrative   Not on file   Social Drivers of Health   Financial Resource Strain: Not on file  Food Insecurity: Not on file  Transportation Needs: Not on file  Physical Activity: Not on file  Stress: Not on file   Social Connections: Unknown (11/16/2021)   Received from [facility]   Social Network    Social Network: Not on file  Intimate Partner Violence: Unknown (10/08/2021)   Received from [facility]   HITS    Physically Hurt: Not on file    Insult or Talk Down To: Not on file    Threaten Physical Harm: Not on file    Scream or Curse: Not on file    FAMILY HISTORY: No family history on file.  ALLERGIES:  is allergic to gazyva [obinutuzumab] and rituximab.  MEDICATIONS:  Current Outpatient Medications  Medication Sig Dispense Refill   acyclovir (ZOVIRAX) 400 MG tablet Take 1 tablet (400 mg total) by mouth daily. (Patient not taking: Reported on 08/13/2019) 30 tablet 3   allopurinol (ZYLOPRIM) 100 MG tablet TAKE 1 TABLET BY MOUTH TWICE A DAY 180 tablet 1   azithromycin (ZITHROMAX Z-PAK) 250 MG tablet 2 tabs (500mg ) on day 1 and then 1 tab (250mg ) PO daily for 4 days 6 each 0   fenofibrate micronized (LOFIBRA) 67 MG capsule Take 67 mg by mouth daily before breakfast.     lisinopril (PRINIVIL,ZESTRIL) 10 MG tablet Take 10 mg by mouth daily.     ondansetron (ZOFRAN-ODT) 4 MG disintegrating tablet 4mg  ODT q4 hours prn nausea/vomit 10 tablet 0   sertraline (ZOLOFT) 100 MG tablet Take 100 mg by  mouth daily.     venetoclax (VENCLEXTA) 100 MG tablet Take 3 tablets (300 mg total) by mouth daily. Tablets should be swallowed whole with a meal and a full glass of water. 84 tablet 1   VITAMIN D PO Take 2,000 Units by mouth.     No current facility-administered medications for this visit.    REVIEW OF SYSTEMS:    10 Point review of Systems was done is negative except as noted above.   PHYSICAL EXAMINATION: .There were no vitals taken for this visit.   GENERAL:alert, in no acute distress and comfortable SKIN: no acute rashes, no significant lesions EYES: conjunctiva are pink and non-injected, sclera anicteric OROPHARYNX: MMM, no exudates, no oropharyngeal erythema  or ulceration NECK: supple, no JVD LYMPH:  no palpable lymphadenopathy in the cervical, axillary or inguinal regions LUNGS: clear to auscultation b/l with normal respiratory effort HEART: regular rate & rhythm ABDOMEN:  normoactive bowel sounds , non tender, not distended. Extremity: no pedal edema PSYCH: alert & oriented x 3 with fluent speech NEURO: no focal motor/sensory deficits   LABORATORY DATA:       Latest Ref Rng & Units 06/27/2023    8:45 AM 03/29/2023   10:41 AM 01/19/2023   12:09 PM  CBC  WBC 4.0 - 10.5 K/uL 4.1  5.1  3.4   Hemoglobin 13.0 - 17.0 g/dL 16.1  09.6  04.5   Hematocrit 39.0 - 52.0 % 42.8  42.7  42.5   Platelets 150 - 400 K/uL 221  232  197         Latest Ref Rng & Units 06/27/2023    8:45 AM 03/29/2023   10:41 AM 01/19/2023   12:09 PM  CMP  Glucose 70 - 99 mg/dL 97  99  409   BUN 6 - 20 mg/dL 14  14  16    Creatinine 0.61 - 1.24 mg/dL 8.11  9.14  7.82   Sodium 135 - 145 mmol/L 141  141  140   Potassium 3.5 - 5.1 mmol/L 4.1  4.1  3.8   Chloride 98 - 111 mmol/L 110  108  107   CO2 22 - 32 mmol/L 24  27  25    Calcium 8.9 - 10.3 mg/dL 9.4  9.8  9.5   Total Protein 6.5 - 8.1 g/dL 6.5  6.9  6.7   Total Bilirubin <1.2 mg/dL 0.6  0.6  0.6   Alkaline Phos 38 - 126 U/L 68  81  81   AST 15 - 41 U/L 34  33  24   ALT 0 - 44 U/L 35  38  24    . Lab Results  Component Value Date   LDH 155 06/27/2023   08/25/2021 Uric acid 3.8  Component     Latest Ref Rng & Units 10/30/2017  HIV Screen 4th Generation wRfx     Non Reactive Non Reactive  HCV Ab     0.0 - 0.9 s/co ratio <0.1  Hepatitis B Surface Ag     Negative Negative  Hep B Core Ab, Tot     Negative Negative      10/25/17 Tissue Flow Cytometry:    10/25/17 Needle/core Bx:   [facility] Oncology  Order: [id number]  Status:  Final result   Visible to patient:  No (Not Released) Next appt:  11/07/2017 at 09:00 AM in Radiology ([facility] PET) Dx:  Lymphocytosis  Component 62mo ago  Specimen Type Comment:    Comment: BLOOD  Cells Counted  200   Cells Analyzed 200   FISH Result Comment:   Comment: NORMAL:  NO CYCLIN D1 OR IGH GENE REARRANGEMENT OBSERVED  Interpretation Comment:   Comment: (NOTE)               nuc ish 11q13(CCND1x2),14q32(IGHx2)[200]       The fluorescence in situ hybridization (FISH) result  was normal. Dual color dual fusion DNA FISH probes targeting  the cyclin D1 gene (CCND1 or BCL1)([employer] and the  heavy chain immunoglobulin gene (IgH) at 14q32 showed two  hybridization signals each with no fusion in all cells  examined. Thus, there was NO apparent rearrangement of the  primary genes involved with mantle cell lymphoma, and to a  lesser extent myeloma.  Marland Kitchen         RADIOGRAPHIC STUDIES: I have personally reviewed the radiological images as listed and agreed with the findings in the report. No results found. CT chest abdomen pelvis 03/26/2021: IMPRESSION: 1. Interval development of bulky lymphadenopathy in the chest, abdomen, and pelvis. Imaging features compatible with recurrent lymphoma. 2. No hepatosplenomegaly. 3. Enlargement of the pulmonary outflow tract and main pulmonary arteries suggests pulmonary arterial hypertension. 4. 2-3 mm left upper lobe pulmonary nodule was largely obscured on the prior study. Most likely benign, attention on follow-up recommended.     Electronically Signed   By: Kennith Center M.D.  CT neck 03/26/2021 IMPRESSION: 1. Limited direct comparison to prior PET-CT (no prior neck CTs) with possible slight increase in size of a few left submandibular nodes. Otherwise, similar increased number of lymph nodes throughout the neck without substantial change in multiple mildly enlarged nodes (detailed above). 2. Oval 2.1 x 1.3 cm mass in the posterior right nasal cavity, nonspecific but potentially a polyp or retention cyst. Recommend correlation with direct inspection.     ASSESSMENT & PLAN:   42 y.o. male with  1.  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 Noted 13q deletion: 13q14.3 is at 47.67% for normal nuclei, and 35.67% with mono allelic deletion of [id number].  -ATM for 11q22.3 revealed 81.67% with normal nuclei and 18.33% with positive nuclei for del 11q22.3   02/26/18 CT C/A/P revealed  Marked response to therapy as evidenced by decrease in size or resolution of adenopathy throughout chest, abdomen and pelvis. 2. Hepatic steatosis. Slight marginal irregularity suggests cirrhosis. 3. Enlarged pulmonary arteries, indicative of pulmonary arterial hypertension.   S/p 6 cycles of BR completed on 04/19/18 for first-line treatment  06/04/18 CT C/A/P revealed Stable CT chest without thoracic lymphadenopathy. Continued further decrease in abdominal and pelvic lymphadenopathy. No new or progressive lymphadenopathy on today's exam. 2. Hepatic steatosis. Subtle nodularity of liver contour raises the question of cirrhosis. 3. Pulmonary arterial enlargement. Pulmonary arterial hypertension a distinct concern.  2. H/o Rituxan infusion reaction-  -Had significant flushing with rituxan needing steroids. Not a candidate in future for rapid infusion protocol.  3.  History of some rigors and flushing with Gazyva controlled with adding Demerol and adjusting premedications.  4. Sleep apnea Notes he had a sleep study since his last clinic visit and diagnosis was confirmed. -uses CPAP  5. history of influenza A  6.  Mildly elevated transaminases less than 2 times upper limit of normal likely related to his recent influenza A and Tamiflu.  We will monitor-now resolved  PLAN:  -Discussed lab results on 06/27/23 in detail with patient. CBC normal, showed WBC of 4.1K,  hemoglobin of 15.0, and platelets of 221K. -CMP and LDH are pending at time of visit -do not anticipate any new concerns on CMP or LDH -No notable toxicities from venetoclax 300 mg p.o. daily. -Continue  Venetoclax at 300 mg daily -No evidence of CLL progression at this time.   FOLLOW-UP: RTC with Dr Candise Che with labs in 3 months  The total time spent in the appointment was 20 minutes* .  All of the patient's questions were answered with apparent satisfaction. The patient knows to call the clinic with any problems, questions or concerns.   Wyvonnia Lora MD MS AAHIVMS [facility] Hematology/Oncology Physician [facility]  .*Total Encounter Time as defined by the Centers for Medicare and Medicaid Services includes, in addition to the face-to-face time of a patient visit (documented in the note above) non-face-to-face time: obtaining and reviewing outside history, ordering and reviewing medications, tests or procedures, care coordination (communications with other health care professionals or caregivers) and documentation in the medical record.    I,Mitra Faeizi,acting as a Neurosurgeon for Wyvonnia Lora, MD.,have documented all relevant documentation on the behalf of Wyvonnia Lora, MD,as directed by  Wyvonnia Lora, MD while in the presence of Wyvonnia Lora, MD.  .I have reviewed the above documentation for accuracy and completeness, and I agree with the above. Johney Maine MD

## 2023-06-30 ENCOUNTER — Encounter: Payer: Self-pay | Admitting: Hematology

## 2023-06-30 DIAGNOSIS — G4733 Obstructive sleep apnea (adult) (pediatric): Secondary | ICD-10-CM | POA: Diagnosis not present

## 2023-07-02 ENCOUNTER — Other Ambulatory Visit: Payer: Self-pay

## 2023-07-19 ENCOUNTER — Other Ambulatory Visit: Payer: Self-pay

## 2023-07-19 ENCOUNTER — Other Ambulatory Visit: Payer: Self-pay | Admitting: Hematology

## 2023-07-19 MED ORDER — VENETOCLAX 100 MG PO TABS
300.0000 mg | ORAL_TABLET | Freq: Every day | ORAL | 1 refills | Status: DC
Start: 2023-07-19 — End: 2023-09-12
  Filled 2023-07-19: qty 84, 28d supply, fill #0
  Filled 2023-08-17: qty 84, 28d supply, fill #1

## 2023-07-19 NOTE — Progress Notes (Signed)
 Specialty Pharmacy Refill Coordination Note  Jordan Schroeder is a 43 y.o. male contacted today regarding refills of specialty medication(s) Venetoclax  (VENCLEXTA )   Patient requested Delivery   Delivery date: 07/28/23   Verified address: Patient address [address]  [place] [address]   Medication will be filled on 01.23.25.   Refill request pending

## 2023-07-27 ENCOUNTER — Other Ambulatory Visit: Payer: Self-pay

## 2023-08-17 ENCOUNTER — Other Ambulatory Visit: Payer: Self-pay

## 2023-08-17 ENCOUNTER — Other Ambulatory Visit: Payer: Self-pay | Admitting: Pharmacy Technician

## 2023-08-17 NOTE — Progress Notes (Signed)
Specialty Pharmacy Refill Coordination Note  Jordan Schroeder is a 43 y.o. male contacted today regarding refills of specialty medication(s) Venetoclax [name])   Patient requested Delivery   Delivery date: 08/23/23   Verified address: Patient address [address]  [place]   Medication will be filled on 08/22/23.

## 2023-08-21 ENCOUNTER — Other Ambulatory Visit: Payer: Self-pay
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
## 2023-08-23 ENCOUNTER — Other Ambulatory Visit: Payer: Self-pay

## 2023-08-23 DIAGNOSIS — J40 Bronchitis, not specified as acute or chronic: Secondary | ICD-10-CM | POA: Diagnosis not present

## 2023-08-23 DIAGNOSIS — H73011 Bullous myringitis, right ear: Secondary | ICD-10-CM | POA: Diagnosis not present

## 2023-08-23 DIAGNOSIS — R059 Cough, unspecified: Secondary | ICD-10-CM | POA: Diagnosis not present

## 2023-08-23 DIAGNOSIS — R509 Fever, unspecified: Secondary | ICD-10-CM | POA: Diagnosis not present

## 2023-08-23 DIAGNOSIS — H66012 Acute suppurative otitis media with spontaneous rupture of ear drum, left ear: Secondary | ICD-10-CM | POA: Diagnosis not present

## 2023-08-31 DIAGNOSIS — G4733 Obstructive sleep apnea (adult) (pediatric): Secondary | ICD-10-CM | POA: Diagnosis not present

## 2023-09-05 DIAGNOSIS — H65193 Other acute nonsuppurative otitis media, bilateral: Secondary | ICD-10-CM | POA: Diagnosis not present

## 2023-09-12 ENCOUNTER — Other Ambulatory Visit (HOSPITAL_COMMUNITY): Payer: Self-pay

## 2023-09-12 ENCOUNTER — Other Ambulatory Visit: Payer: Self-pay

## 2023-09-12 ENCOUNTER — Other Ambulatory Visit: Payer: Self-pay | Admitting: Hematology

## 2023-09-12 MED ORDER — VENETOCLAX 100 MG PO TABS
300.0000 mg | ORAL_TABLET | Freq: Every day | ORAL | 1 refills | Status: DC
  Filled 2023-09-12: qty 84, 28d supply, fill #0
  Filled 2023-10-05: qty 84, 28d supply, fill #1

## 2023-09-12 NOTE — Progress Notes (Signed)
 Specialty Pharmacy Refill Coordination Note  Jordan Schroeder is a 43 y.o. male contacted today regarding refills of specialty medication(s) Venclexta.  Patient requested (Patient-Rptd) Delivery   Delivery date: (Patient-Rptd) 09/20/23   Verified address: (Patient-Rptd) [address] [place] [address]   Medication will be filled on 09/19/23.   This fill date is pending response to refill request from provider. Patient is aware and if they have not received fill by intended date, they must follow up with pharmacy.

## 2023-09-17 ENCOUNTER — Other Ambulatory Visit: Payer: Self-pay

## 2023-09-18 ENCOUNTER — Other Ambulatory Visit: Payer: Self-pay

## 2023-09-19 ENCOUNTER — Other Ambulatory Visit: Payer: Self-pay

## 2023-09-19 ENCOUNTER — Other Ambulatory Visit (HOSPITAL_COMMUNITY): Payer: Self-pay

## 2023-09-28 DIAGNOSIS — G4733 Obstructive sleep apnea (adult) (pediatric): Secondary | ICD-10-CM | POA: Diagnosis not present

## 2023-09-29 ENCOUNTER — Inpatient Hospital Stay: Payer: BLUE CROSS/BLUE SHIELD | Admitting: Hematology

## 2023-09-29 ENCOUNTER — Inpatient Hospital Stay: Payer: BLUE CROSS/BLUE SHIELD

## 2023-10-03 ENCOUNTER — Other Ambulatory Visit: Payer: Self-pay

## 2023-10-03 DIAGNOSIS — C9110 Chronic lymphocytic leukemia of B-cell type not having achieved remission: Secondary | ICD-10-CM

## 2023-10-04 ENCOUNTER — Inpatient Hospital Stay (HOSPITAL_BASED_OUTPATIENT_CLINIC_OR_DEPARTMENT_OTHER): Admitting: Hematology

## 2023-10-04 ENCOUNTER — Inpatient Hospital Stay: Attending: Hematology

## 2023-10-04 VITALS — BP 109/79 | HR 86 | Temp 98.1°F | Resp 18 | Ht 68.0 in | Wt 281.7 lb

## 2023-10-04 DIAGNOSIS — I10 Essential (primary) hypertension: Secondary | ICD-10-CM | POA: Insufficient documentation

## 2023-10-04 DIAGNOSIS — E7800 Pure hypercholesterolemia, unspecified: Secondary | ICD-10-CM | POA: Diagnosis not present

## 2023-10-04 DIAGNOSIS — Z79899 Other long term (current) drug therapy: Secondary | ICD-10-CM | POA: Diagnosis not present

## 2023-10-04 DIAGNOSIS — Z79624 Long term (current) use of inhibitors of nucleotide synthesis: Secondary | ICD-10-CM | POA: Insufficient documentation

## 2023-10-04 DIAGNOSIS — C9110 Chronic lymphocytic leukemia of B-cell type not having achieved remission: Secondary | ICD-10-CM | POA: Insufficient documentation

## 2023-10-04 DIAGNOSIS — G4730 Sleep apnea, unspecified: Secondary | ICD-10-CM | POA: Diagnosis not present

## 2023-10-04 DIAGNOSIS — R232 Flushing: Secondary | ICD-10-CM | POA: Diagnosis not present

## 2023-10-04 DIAGNOSIS — Z7969 Long term (current) use of other immunomodulators and immunosuppressants: Secondary | ICD-10-CM | POA: Diagnosis not present

## 2023-10-04 DIAGNOSIS — K760 Fatty (change of) liver, not elsewhere classified: Secondary | ICD-10-CM | POA: Insufficient documentation

## 2023-10-04 DIAGNOSIS — Z806 Family history of leukemia: Secondary | ICD-10-CM | POA: Diagnosis not present

## 2023-10-04 DIAGNOSIS — R220 Localized swelling, mass and lump, head: Secondary | ICD-10-CM | POA: Insufficient documentation

## 2023-10-04 DIAGNOSIS — I2721 Secondary pulmonary arterial hypertension: Secondary | ICD-10-CM | POA: Diagnosis not present

## 2023-10-04 LAB — CBC WITH DIFFERENTIAL (CANCER CENTER ONLY)
Abs Immature Granulocytes: 0.02 10*3/uL (ref 0.00–0.07)
Basophils Absolute: 0 10*3/uL (ref 0.0–0.1)
Basophils Relative: 0 %
Eosinophils Absolute: 0 10*3/uL (ref 0.0–0.5)
Eosinophils Relative: 0 %
HCT: 43.9 % (ref 39.0–52.0)
Hemoglobin: 14.8 g/dL (ref 13.0–17.0)
Immature Granulocytes: 1 %
Lymphocytes Relative: 24 %
Lymphs Abs: 1 10*3/uL (ref 0.7–4.0)
MCH: 32 pg (ref 26.0–34.0)
MCHC: 33.7 g/dL (ref 30.0–36.0)
MCV: 94.8 fL (ref 80.0–100.0)
Monocytes Absolute: 0.7 10*3/uL (ref 0.1–1.0)
Monocytes Relative: 16 %
Neutro Abs: 2.5 10*3/uL (ref 1.7–7.7)
Neutrophils Relative %: 59 %
Platelet Count: 216 10*3/uL (ref 150–400)
RBC: 4.63 MIL/uL (ref 4.22–5.81)
RDW: 13.1 % (ref 11.5–15.5)
WBC Count: 4.3 10*3/uL (ref 4.0–10.5)
nRBC: 0 % (ref 0.0–0.2)

## 2023-10-04 LAB — CMP (CANCER CENTER ONLY)
ALT: 24 U/L (ref 0–44)
AST: 24 U/L (ref 15–41)
Albumin: 4.7 g/dL (ref 3.5–5.0)
Alkaline Phosphatase: 77 U/L (ref 38–126)
Anion gap: 6 (ref 5–15)
BUN: 17 mg/dL (ref 6–20)
CO2: 31 mmol/L (ref 22–32)
Calcium: 9.8 mg/dL (ref 8.9–10.3)
Chloride: 102 mmol/L (ref 98–111)
Creatinine: 1.01 mg/dL (ref 0.61–1.24)
GFR, Estimated: >60 mL/min (ref >60)
Glucose, Bld: 105 mg/dL — ABNORMAL HIGH (ref 70–99)
Potassium: 4.1 mmol/L (ref 3.5–5.1)
Sodium: 139 mmol/L (ref 135–145)
Total Bilirubin: 0.5 mg/dL (ref 0.0–1.2)
Total Protein: 6.7 g/dL (ref 6.5–8.1)

## 2023-10-04 LAB — LACTATE DEHYDROGENASE: LDH: 157 U/L (ref 98–192)

## 2023-10-04 NOTE — Progress Notes (Signed)
 HEMATOLOGY/ONCOLOGY CLINIC VISIT NOTE  Date of Service: 10/04/2023  Patient Care Team: Jordan Moccasin, MD as PCP - General (Family Medicine)  CHIEF COMPLAINTS/PURPOSE OF CONSULTATION:   follow-up for continued evaluation and management of CLL  HISTORY OF PRESENTING ILLNESS:  See previous note for details on initial presentation.  Interval History:  [name] is a 43 y.o. male is here today for continued evaluation and management of his CLL.  Patient was last seen by me on 06/27/2023 and was doing well overall with no new medical complaints.  Today, he reports that he has been doing well over the last 3-4 months. He has been tolerating 300 MG Venetoclax well with no new or severe toxicity issues.   Patient denies any concern for infection issues, back pain, new lumps/bumps, fever, chills, night sweats, shingles, skin rashes, new leg swelling, abdominal pain, or change in bowel habits.  He is planning to travel to [place] by cruise in July 2025.   MEDICAL HISTORY:  Past Medical History:  Diagnosis Date   High cholesterol    Hypertension    Leukemia (HCC)     SURGICAL HISTORY: No past surgical history on file.  SOCIAL HISTORY: Social History   Socioeconomic History   Marital status: Married    Spouse name: Not on file   Number of children: Not on file   Years of education: Not on file   Highest education level: Not on file  Occupational History   Not on file  Tobacco Use   Smoking status: Never   Smokeless tobacco: Never  Vaping Use   Vaping status: Never Used  Substance and Sexual Activity   Alcohol use: Not Currently   Drug use: Never   Sexual activity: Not on file  Other Topics Concern   Not on file  Social History Narrative   Not on file   Social Drivers of Health   Financial Resource Strain: Not on file  Food Insecurity: Not on file  Transportation Needs: Not on file  Physical Activity: Not on file  Stress: Not on file  Social Connections:  Unknown (11/16/2021)   Received from [facility], [facility]   Social Network    Social Network: Not on file  Intimate Partner Violence: Unknown (10/08/2021)   Received from [facility], [facility]   HITS    Physically Hurt: Not on file    Insult or Talk Down To: Not on file    Threaten Physical Harm: Not on file    Scream or Curse: Not on file    FAMILY HISTORY: No family history on file.  ALLERGIES:  is allergic to gazyva [obinutuzumab] and rituximab.  MEDICATIONS:  Current Outpatient Medications  Medication Sig Dispense Refill   acyclovir (ZOVIRAX) 400 MG tablet Take 1 tablet (400 mg total) by mouth daily. (Patient not taking: Reported on 08/13/2019) 30 tablet 3   allopurinol (ZYLOPRIM) 100 MG tablet TAKE 1 TABLET BY MOUTH TWICE A DAY 180 tablet 1   azithromycin (ZITHROMAX Z-PAK) 250 MG tablet 2 tabs (500mg ) on day 1 and then 1 tab (250mg ) PO daily for 4 days 6 each 0   fenofibrate micronized (LOFIBRA) 67 MG capsule Take 67 mg by mouth daily before breakfast.     lisinopril (PRINIVIL,ZESTRIL) 10 MG tablet Take 10 mg by mouth daily.     ondansetron (ZOFRAN-ODT) 4 MG disintegrating tablet 4mg  ODT q4 hours prn nausea/vomit 10 tablet 0   sertraline (ZOLOFT) 100 MG tablet Take 100 mg by  mouth daily.     venetoclax (VENCLEXTA) 100 MG tablet Take 3 tablets (300 mg total) by mouth daily. Tablets should be swallowed whole with a meal and a full glass of water. 84 tablet 1   VITAMIN D PO Take 2,000 Units by mouth.     No current facility-administered medications for this visit.    REVIEW OF SYSTEMS:    10 Point review of Systems was done is negative except as noted above.   PHYSICAL EXAMINATION: .BP 109/79 (BP Location: Left Arm, Patient Position: Sitting)   Pulse 86   Temp 98.1 F (36.7 C) (Tympanic)   Resp 18   Ht 5\' 8"  (1.727 m)   Wt 281 lb 11.2 oz (127.8 kg)   SpO2 98%   BMI 42.83 kg/m   GENERAL:alert, in no acute distress and comfortable SKIN: no acute rashes, no  significant lesions EYES: conjunctiva are pink and non-injected, sclera anicteric OROPHARYNX: MMM, no exudates, no oropharyngeal erythema or ulceration NECK: supple, no JVD LYMPH:  no palpable lymphadenopathy in the cervical, axillary or inguinal regions LUNGS: clear to auscultation b/l with normal respiratory effort HEART: regular rate & rhythm ABDOMEN:  normoactive bowel sounds , non tender, not distended. Extremity: no pedal edema PSYCH: alert & oriented x 3 with fluent speech NEURO: no focal motor/sensory deficits   LABORATORY DATA:       Latest Ref Rng & Units 10/04/2023    1:21 PM 06/27/2023    8:45 AM 03/29/2023   10:41 AM  CBC  WBC 4.0 - 10.5 K/uL 4.3  4.1  5.1   Hemoglobin 13.0 - 17.0 g/dL 16.1  09.6  04.5   Hematocrit 39.0 - 52.0 % 43.9  42.8  42.7   Platelets 150 - 400 K/uL 216  221  232         Latest Ref Rng & Units 10/04/2023    1:21 PM 06/27/2023    8:45 AM 03/29/2023   10:41 AM  CMP  Glucose 70 - 99 mg/dL 409  97  99   BUN 6 - 20 mg/dL 17  14  14    Creatinine 0.61 - 1.24 mg/dL 8.11  9.14  7.82   Sodium 135 - 145 mmol/L 139  141  141   Potassium 3.5 - 5.1 mmol/L 4.1  4.1  4.1   Chloride 98 - 111 mmol/L 102  110  108   CO2 22 - 32 mmol/L 31  24  27    Calcium 8.9 - 10.3 mg/dL 9.8  9.4  9.8   Total Protein 6.5 - 8.1 g/dL 6.7  6.5  6.9   Total Bilirubin 0.0 - 1.2 mg/dL 0.5  0.6  0.6   Alkaline Phos 38 - 126 U/L 77  68  81   AST 15 - 41 U/L 24  34  33   ALT 0 - 44 U/L 24  35  38    . Lab Results  Component Value Date   LDH 157 10/04/2023   08/25/2021 Uric acid 3.8  Component     Latest Ref Rng & Units 10/30/2017  HIV Screen 4th Generation wRfx     Non Reactive Non Reactive  HCV Ab     0.0 - 0.9 s/co ratio <0.1  Hepatitis B Surface Ag     Negative Negative  Hep B Core Ab, Tot     Negative Negative      10/25/17 Tissue Flow Cytometry:    10/25/17 Needle/core Bx:   [facility] Oncology  Order: [id number]  Status:  Final result   Visible to patient:   No (Not Released) Next appt:  11/07/2017 at 09:00 AM in Radiology ([facility] PET) Dx:  Lymphocytosis  Component 81mo ago  Specimen Type Comment:   Comment: BLOOD  Cells Counted 200   Cells Analyzed 200   FISH Result Comment:   Comment: NORMAL:  NO CYCLIN D1 OR IGH GENE REARRANGEMENT OBSERVED  Interpretation Comment:   Comment: (NOTE)               nuc ish 11q13(CCND1x2),14q32(IGHx2)[200]       The fluorescence in situ hybridization (FISH) result  was normal. Dual color dual fusion DNA FISH probes targeting  the cyclin D1 gene (CCND1 or BCL1)([employer] and the  heavy chain immunoglobulin gene (IgH) at 14q32 showed two  hybridization signals each with no fusion in all cells  examined. Thus, there was NO apparent rearrangement of the  primary genes involved with mantle cell lymphoma, and to a  lesser extent myeloma.  Marland Kitchen         RADIOGRAPHIC STUDIES: I have personally reviewed the radiological images as listed and agreed with the findings in the report. No results found. CT chest abdomen pelvis 03/26/2021: IMPRESSION: 1. Interval development of bulky lymphadenopathy in the chest, abdomen, and pelvis. Imaging features compatible with recurrent lymphoma. 2. No hepatosplenomegaly. 3. Enlargement of the pulmonary outflow tract and main pulmonary arteries suggests pulmonary arterial hypertension. 4. 2-3 mm left upper lobe pulmonary nodule was largely obscured on the prior study. Most likely benign, attention on follow-up recommended.     Electronically Signed   By: Kennith Center M.D.  CT neck 03/26/2021 IMPRESSION: 1. Limited direct comparison to prior PET-CT (no prior neck CTs) with possible slight increase in size of a few left submandibular nodes. Otherwise, similar increased number of lymph nodes throughout the neck without substantial change in multiple mildly enlarged nodes (detailed above). 2. Oval 2.1 x 1.3 cm mass in the posterior right nasal cavity, nonspecific but  potentially a polyp or retention cyst. Recommend correlation with direct inspection.     ASSESSMENT & PLAN:   43 y.o. male with  1.  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 Noted 13q deletion: 13q14.3 is at 47.67% for normal nuclei, and 35.67% with mono allelic deletion of [id number].  -ATM for 11q22.3 revealed 81.67% with normal nuclei and 18.33% with positive nuclei for del 11q22.3   02/26/18 CT C/A/P revealed  Marked response to therapy as evidenced by decrease in size or resolution of adenopathy throughout chest, abdomen and pelvis. 2. Hepatic steatosis. Slight marginal irregularity suggests cirrhosis. 3. Enlarged pulmonary arteries, indicative of pulmonary arterial hypertension.   S/p 6 cycles of BR completed on 04/19/18 for first-line treatment  06/04/18 CT C/A/P revealed Stable CT chest without thoracic lymphadenopathy. Continued further decrease in abdominal and pelvic lymphadenopathy. No new or progressive lymphadenopathy on today's exam. 2. Hepatic steatosis. Subtle nodularity of liver contour raises the question of cirrhosis. 3. Pulmonary arterial enlargement. Pulmonary arterial hypertension a distinct concern.  2. H/o Rituxan infusion reaction-  -Had significant flushing with rituxan needing steroids. Not a candidate in future for rapid infusion protocol.  3.  History of some rigors and flushing with Gazyva controlled with adding Demerol and adjusting premedications.  4. Sleep apnea Notes he had a sleep study since his last clinic visit and diagnosis was confirmed. -uses CPAP  5. history of influenza A  6.  Mildly elevated transaminases less than 2 times upper limit of normal likely related to his recent influenza A and Tamiflu.  We will monitor-now resolved  PLAN:  -Discussed lab results on 10/04/23 in detail with patient. CBC normal, showed WBC of 4.3K, hemoglobin of 14.8, and platelets of 216K. -CMP wnl -LDH  wnl at 157 -patient has been tolerating 300 MG Venetoclax well with no new or severe toxicity issues  -Continue Venetoclax at 300 mg p.o. daily  -No lab or clinical evidence of CLL progression at this time.  -patient shall return to clinic in 3-4 months  FOLLOW-UP: RTC with Dr Candise Che with labs in 3 months  The total time spent in the appointment was 20 minutes* .  All of the patient's questions were answered with apparent satisfaction. The patient knows to call the clinic with any problems, questions or concerns.   Wyvonnia Lora MD MS AAHIVMS [facility] Hematology/Oncology Physician [facility]  .*Total Encounter Time as defined by the Centers for Medicare and Medicaid Services includes, in addition to the face-to-face time of a patient visit (documented in the note above) non-face-to-face time: obtaining and reviewing outside history, ordering and reviewing medications, tests or procedures, care coordination (communications with other health care professionals or caregivers) and documentation in the medical record.    I,Mitra Faeizi,acting as a Neurosurgeon for Wyvonnia Lora, MD.,have documented all relevant documentation on the behalf of Wyvonnia Lora, MD,as directed by  Wyvonnia Lora, MD while in the presence of Wyvonnia Lora, MD.  .I have reviewed the above documentation for accuracy and completeness, and I agree with the above. Johney Maine MD

## 2023-10-05 ENCOUNTER — Other Ambulatory Visit (HOSPITAL_COMMUNITY): Payer: Self-pay

## 2023-10-05 NOTE — Progress Notes (Signed)
 Specialty Pharmacy Ongoing Clinical Assessment Note  Jordan Schroeder is a 43 y.o. male who is being followed by the specialty pharmacy service for RxSp Oncology   Patient's specialty medication(s) reviewed today: Venetoclax (VENCLEXTA)   Missed doses in the last 4 weeks: 0   Patient/Caregiver did not have any additional questions or concerns.   Therapeutic benefit summary: Patient is achieving benefit   Adverse events/side effects summary: No adverse events/side effects   Patient's therapy is appropriate to: Continue    Goals Addressed             This Visit's Progress    Achieve or maintain remission   On track    Patient is on track . Patient will maintain adherence.  Dr. Candise Che noted no evidence of CLL progression at office visit on 06/27/23.         Follow up:  6 months  Servando Snare Specialty Pharmacist

## 2023-10-05 NOTE — Progress Notes (Signed)
 Specialty Pharmacy Refill Coordination Note  Jordan Schroeder is a 43 y.o. male contacted today regarding refills of specialty medication(s) Venetoclax [name])   Patient requested Delivery   Delivery date: 10/11/23   Verified address: [address] [place] [address]   Medication will be filled on 10/10/23.

## 2023-10-06 ENCOUNTER — Telehealth: Payer: Self-pay | Admitting: Hematology

## 2023-10-06 NOTE — Telephone Encounter (Signed)
 Spoke with patient confirming upcoming appointment

## 2023-10-07 ENCOUNTER — Other Ambulatory Visit: Payer: Self-pay

## 2023-10-10 ENCOUNTER — Other Ambulatory Visit: Payer: Self-pay

## 2023-10-10 ENCOUNTER — Encounter: Payer: Self-pay | Admitting: Hematology

## 2023-10-24 DIAGNOSIS — Z713 Dietary counseling and surveillance: Secondary | ICD-10-CM | POA: Diagnosis not present

## 2023-10-24 DIAGNOSIS — E66813 Obesity, class 3: Secondary | ICD-10-CM | POA: Diagnosis not present

## 2023-10-24 DIAGNOSIS — R739 Hyperglycemia, unspecified: Secondary | ICD-10-CM | POA: Diagnosis not present

## 2023-10-24 DIAGNOSIS — Z0000 Encounter for general adult medical examination without abnormal findings: Secondary | ICD-10-CM | POA: Diagnosis not present

## 2023-10-24 DIAGNOSIS — Z6841 Body Mass Index (BMI) 40.0 and over, adult: Secondary | ICD-10-CM | POA: Diagnosis not present

## 2023-10-25 DIAGNOSIS — E559 Vitamin D deficiency, unspecified: Secondary | ICD-10-CM | POA: Diagnosis not present

## 2023-10-25 DIAGNOSIS — Z125 Encounter for screening for malignant neoplasm of prostate: Secondary | ICD-10-CM | POA: Diagnosis not present

## 2023-10-25 DIAGNOSIS — E785 Hyperlipidemia, unspecified: Secondary | ICD-10-CM | POA: Diagnosis not present

## 2023-10-25 DIAGNOSIS — R739 Hyperglycemia, unspecified: Secondary | ICD-10-CM | POA: Diagnosis not present

## 2023-11-03 ENCOUNTER — Other Ambulatory Visit: Payer: Self-pay | Admitting: Hematology

## 2023-11-03 ENCOUNTER — Other Ambulatory Visit: Payer: Self-pay | Admitting: Pharmacy Technician

## 2023-11-03 ENCOUNTER — Other Ambulatory Visit: Payer: Self-pay

## 2023-11-03 MED ORDER — VENETOCLAX 100 MG PO TABS
300.0000 mg | ORAL_TABLET | Freq: Every day | ORAL | 1 refills | Status: DC
  Filled 2023-11-03: qty 84, 28d supply, fill #0
  Filled 2023-12-11: qty 84, 28d supply, fill #1

## 2023-11-03 NOTE — Progress Notes (Signed)
 Specialty Pharmacy Refill Coordination Note  Jordan Schroeder is a 43 y.o. male contacted today regarding refills of specialty medication(s) Venetoclax  (VENCLEXTA )   Patient requested Delivery   Delivery date: 11/14/23   Verified address: [address] [place] [address]   Medication will be filled on 11/13/23.   This fill date is pending response to refill request from provider. Patient is aware and if they have not received fill by intended date they must follow up with pharmacy.

## 2023-11-10 ENCOUNTER — Other Ambulatory Visit: Payer: Self-pay | Admitting: Hematology

## 2023-11-13 ENCOUNTER — Other Ambulatory Visit: Payer: Self-pay

## 2023-11-16 DIAGNOSIS — R7303 Prediabetes: Secondary | ICD-10-CM | POA: Diagnosis not present

## 2023-11-16 DIAGNOSIS — F419 Anxiety disorder, unspecified: Secondary | ICD-10-CM | POA: Diagnosis not present

## 2023-11-16 DIAGNOSIS — E785 Hyperlipidemia, unspecified: Secondary | ICD-10-CM | POA: Diagnosis not present

## 2023-11-16 DIAGNOSIS — G4733 Obstructive sleep apnea (adult) (pediatric): Secondary | ICD-10-CM | POA: Diagnosis not present

## 2023-11-16 DIAGNOSIS — I10 Essential (primary) hypertension: Secondary | ICD-10-CM | POA: Diagnosis not present

## 2023-11-21 DIAGNOSIS — E785 Hyperlipidemia, unspecified: Secondary | ICD-10-CM | POA: Diagnosis not present

## 2023-11-21 DIAGNOSIS — I10 Essential (primary) hypertension: Secondary | ICD-10-CM | POA: Diagnosis not present

## 2023-11-21 DIAGNOSIS — R7303 Prediabetes: Secondary | ICD-10-CM | POA: Diagnosis not present

## 2023-11-30 DIAGNOSIS — R7303 Prediabetes: Secondary | ICD-10-CM | POA: Diagnosis not present

## 2023-11-30 DIAGNOSIS — E785 Hyperlipidemia, unspecified: Secondary | ICD-10-CM | POA: Diagnosis not present

## 2023-11-30 DIAGNOSIS — G4733 Obstructive sleep apnea (adult) (pediatric): Secondary | ICD-10-CM | POA: Diagnosis not present

## 2023-11-30 DIAGNOSIS — I10 Essential (primary) hypertension: Secondary | ICD-10-CM | POA: Diagnosis not present

## 2023-12-11 ENCOUNTER — Other Ambulatory Visit: Payer: Self-pay

## 2023-12-11 ENCOUNTER — Other Ambulatory Visit: Payer: Self-pay | Admitting: Pharmacy Technician

## 2023-12-11 NOTE — Progress Notes (Signed)
 Specialty Pharmacy Refill Coordination Note  Jordan Schroeder is a 43 y.o. male contacted today regarding refills of specialty medication(s) Venetoclax  (VENCLEXTA )   Patient requested Delivery   Delivery date: 12/13/23   Verified address: [address]  [place]   Medication will be filled on 12/12/23.

## 2023-12-14 DIAGNOSIS — E785 Hyperlipidemia, unspecified: Secondary | ICD-10-CM | POA: Diagnosis not present

## 2023-12-14 DIAGNOSIS — G4733 Obstructive sleep apnea (adult) (pediatric): Secondary | ICD-10-CM | POA: Diagnosis not present

## 2023-12-14 DIAGNOSIS — R7303 Prediabetes: Secondary | ICD-10-CM | POA: Diagnosis not present

## 2023-12-14 DIAGNOSIS — I10 Essential (primary) hypertension: Secondary | ICD-10-CM | POA: Diagnosis not present

## 2023-12-27 DIAGNOSIS — G4733 Obstructive sleep apnea (adult) (pediatric): Secondary | ICD-10-CM | POA: Diagnosis not present

## 2023-12-29 DIAGNOSIS — G4733 Obstructive sleep apnea (adult) (pediatric): Secondary | ICD-10-CM | POA: Diagnosis not present

## 2024-01-03 ENCOUNTER — Other Ambulatory Visit: Payer: Self-pay

## 2024-01-04 ENCOUNTER — Other Ambulatory Visit: Payer: Self-pay | Admitting: Hematology

## 2024-01-04 ENCOUNTER — Other Ambulatory Visit (HOSPITAL_COMMUNITY): Payer: Self-pay

## 2024-01-04 ENCOUNTER — Other Ambulatory Visit: Payer: Self-pay | Admitting: Pharmacy Technician

## 2024-01-04 ENCOUNTER — Other Ambulatory Visit: Payer: Self-pay

## 2024-01-04 MED ORDER — VENETOCLAX 100 MG PO TABS
300.0000 mg | ORAL_TABLET | Freq: Every day | ORAL | 1 refills | Status: DC
  Filled 2024-01-04: qty 84, 28d supply, fill #0
  Filled 2024-01-31: qty 84, 28d supply, fill #1

## 2024-01-04 NOTE — Progress Notes (Signed)
 Specialty Pharmacy Refill Coordination Note  Jordan Schroeder is a 43 y.o. male contacted today regarding refills of specialty medication(s) Venetoclax  (VENCLEXTA )   Patient requested Delivery   Delivery date: 01/10/24   Verified address: [address] [place] [address]   Medication will be filled on 01/09/24.  This fill date is pending response to refill request from provider. Patient is aware and if they have not received fill by intended date they must follow up with pharmacy.

## 2024-01-08 DIAGNOSIS — I10 Essential (primary) hypertension: Secondary | ICD-10-CM | POA: Diagnosis not present

## 2024-01-08 DIAGNOSIS — G4733 Obstructive sleep apnea (adult) (pediatric): Secondary | ICD-10-CM | POA: Diagnosis not present

## 2024-01-08 DIAGNOSIS — R7303 Prediabetes: Secondary | ICD-10-CM | POA: Diagnosis not present

## 2024-01-08 DIAGNOSIS — E785 Hyperlipidemia, unspecified: Secondary | ICD-10-CM | POA: Diagnosis not present

## 2024-01-09 ENCOUNTER — Other Ambulatory Visit: Payer: Self-pay

## 2024-01-22 ENCOUNTER — Other Ambulatory Visit

## 2024-01-22 ENCOUNTER — Ambulatory Visit: Admitting: Hematology

## 2024-01-26 ENCOUNTER — Other Ambulatory Visit: Payer: Self-pay

## 2024-01-26 DIAGNOSIS — C9110 Chronic lymphocytic leukemia of B-cell type not having achieved remission: Secondary | ICD-10-CM

## 2024-01-28 NOTE — Progress Notes (Signed)
 HEMATOLOGY/ONCOLOGY CLINIC VISIT NOTE  Date of Service: 01/29/2024  Patient Care Team: Waylan Almarie SAUNDERS, MD as PCP - General (Family Medicine)  CHIEF COMPLAINTS/PURPOSE OF CONSULTATION:   follow-up for continued evaluation and management of CLL  HISTORY OF PRESENTING ILLNESS:  See previous note for details on initial presentation.  Interval History:  [name] is a 43 y.o. male here today for continued evaluation and management of his CLL.  Patient was last seen by me on 10/04/2023 and was doing well overall with no new medical complaints.  Patient with no acute new symptoms since his last clinic visit.  He notes he had gone from [place]  cruise with his wife and had a good time.  She notes no acute new symptoms.  No fevers no chills no night sweats no unexpected weight loss. No notable toxicities from his current dose of venetoclax . No infection issues and no issues with recurrent shingles. Labs were reviewed in details.  MEDICAL HISTORY:  Past Medical History:  Diagnosis Date   High cholesterol    Hypertension    Leukemia (HCC)     SURGICAL HISTORY: No past surgical history on file.  SOCIAL HISTORY: Social History   Socioeconomic History   Marital status: Married    Spouse name: Not on file   Number of children: Not on file   Years of education: Not on file   Highest education level: Not on file  Occupational History   Not on file  Tobacco Use   Smoking status: Never   Smokeless tobacco: Never  Vaping Use   Vaping status: Never Used  Substance and Sexual Activity   Alcohol use: Not Currently   Drug use: Never   Sexual activity: Not on file  Other Topics Concern   Not on file  Social History Narrative   Not on file   Social Drivers of Health   Financial Resource Strain: Not on file  Food Insecurity: Not on file  Transportation Needs: Not on file  Physical Activity: Not on file  Stress: Not on file  Social Connections: Unknown (11/16/2021)    Received from [facility]   Social Network    Social Network: Not on file  Intimate Partner Violence: Unknown (10/08/2021)   Received from [facility]   HITS    Physically Hurt: Not on file    Insult or Talk Down To: Not on file    Threaten Physical Harm: Not on file    Scream or Curse: Not on file    FAMILY HISTORY: No family history on file.  ALLERGIES:  is allergic to gazyva  [obinutuzumab ] and rituximab .  MEDICATIONS:  Current Outpatient Medications  Medication Sig Dispense Refill   acyclovir  (ZOVIRAX ) 400 MG tablet Take 1 tablet (400 mg total) by mouth daily. (Patient not taking: Reported on 08/13/2019) 30 tablet 3   azithromycin  (ZITHROMAX  Z-PAK) 250 MG tablet 2 tabs (500mg ) on day 1 and then 1 tab (250mg ) PO daily for 4 days 6 each 0   fenofibrate micronized (LOFIBRA) 67 MG capsule Take 67 mg by mouth daily before breakfast.     lisinopril (PRINIVIL,ZESTRIL) 10 MG tablet Take 10 mg by mouth daily.     ondansetron  (ZOFRAN -ODT) 4 MG disintegrating tablet 4mg  ODT q4 hours prn nausea/vomit 10 tablet 0   sertraline (ZOLOFT) 100 MG tablet Take 100 mg by mouth daily.     venetoclax  (VENCLEXTA ) 100 MG tablet Take 3 tablets (300 mg total) by mouth daily. Tablets should be swallowed whole with  a meal and a full glass of water. 84 tablet 1   VITAMIN D PO Take 2,000 Units by mouth.     No current facility-administered medications for this visit.    REVIEW OF SYSTEMS:    10 Point review of Systems was done is negative except as noted above.   PHYSICAL EXAMINATION: .BP 108/74 (BP Location: Left Arm, Patient Position: Sitting)   Pulse 84   Temp 97.9 F (36.6 C) (Temporal)   Resp 18   Wt 257 lb (116.6 kg)   SpO2 97%   BMI 39.08 kg/m   GENERAL:alert, in no acute distress and comfortable SKIN: no acute rashes, no significant lesions EYES: conjunctiva are pink and non-injected, sclera anicteric OROPHARYNX: MMM, no exudates, no oropharyngeal erythema or ulceration NECK: supple,  no JVD LYMPH:  no palpable lymphadenopathy in the cervical, axillary or inguinal regions LUNGS: clear to auscultation b/l with normal respiratory effort HEART: regular rate & rhythm ABDOMEN:  normoactive bowel sounds , non tender, not distended.  No palpable hepatosplenomegaly Extremity: no pedal edema PSYCH: alert & oriented x 3 with fluent speech NEURO: no focal motor/sensory deficits   LABORATORY DATA:       Latest Ref Rng & Units 01/29/2024    2:39 PM 10/04/2023    1:21 PM 06/27/2023    8:45 AM  CBC  WBC 4.0 - 10.5 K/uL 4.9  4.3  4.1   Hemoglobin 13.0 - 17.0 g/dL 84.8  85.1  84.9   Hematocrit 39.0 - 52.0 % 43.8  43.9  42.8   Platelets 150 - 400 K/uL 212  216  221         Latest Ref Rng & Units 01/29/2024    2:39 PM 10/04/2023    1:21 PM 06/27/2023    8:45 AM  CMP  Glucose 70 - 99 mg/dL 93  894  97   BUN 6 - 20 mg/dL 12  17  14    Creatinine 0.61 - 1.24 mg/dL 9.01  8.98  9.13   Sodium 135 - 145 mmol/L 139  139  141   Potassium 3.5 - 5.1 mmol/L 4.7  4.1  4.1   Chloride 98 - 111 mmol/L 104  102  110   CO2 22 - 32 mmol/L 27  31  24    Calcium 8.9 - 10.3 mg/dL 9.8  9.8  9.4   Total Protein 6.5 - 8.1 g/dL 7.1  6.7  6.5   Total Bilirubin 0.0 - 1.2 mg/dL 0.7  0.5  0.6   Alkaline Phos 38 - 126 U/L 77  77  68   AST 15 - 41 U/L 30  24  34   ALT 0 - 44 U/L 27  24  35    . Lab Results  Component Value Date   LDH 157 10/04/2023   08/25/2021 Uric acid 3.8  Component     Latest Ref Rng & Units 10/30/2017  HIV Screen 4th Generation wRfx     Non Reactive Non Reactive  HCV Ab     0.0 - 0.9 s/co ratio <0.1  Hepatitis B Surface Ag     Negative Negative  Hep B Core Ab, Tot     Negative Negative      10/25/17 Tissue Flow Cytometry:    10/25/17 Needle/core Bx:   [facility] Oncology  Order: [id number]  Status:  Final result   Visible to patient:  No (Not Released) Next appt:  11/07/2017 at 09:00 AM in Radiology ([facility] PET) Dx:  Lymphocytosis  Component 32mo ago  Specimen Type  Comment:   Comment: BLOOD  Cells Counted 200   Cells Analyzed 200   FISH Result Comment:   Comment: NORMAL:  NO CYCLIN D1 OR IGH GENE REARRANGEMENT OBSERVED  Interpretation Comment:   Comment: (NOTE)               nuc ish 11q13(CCND1x2),14q32(IGHx2)[200]       The fluorescence in situ hybridization (FISH) result  was normal. Dual color dual fusion DNA FISH probes targeting  the cyclin D1 gene (CCND1 or BCL1)([employer] and the  heavy chain immunoglobulin gene (IgH) at 14q32 showed two  hybridization signals each with no fusion in all cells  examined. Thus, there was NO apparent rearrangement of the  primary genes involved with mantle cell lymphoma, and to a  lesser extent myeloma.  SABRA         RADIOGRAPHIC STUDIES: I have personally reviewed the radiological images as listed and agreed with the findings in the report. No results found. CT chest abdomen pelvis 03/26/2021: IMPRESSION: 1. Interval development of bulky lymphadenopathy in the chest, abdomen, and pelvis. Imaging features compatible with recurrent lymphoma. 2. No hepatosplenomegaly. 3. Enlargement of the pulmonary outflow tract and main pulmonary arteries suggests pulmonary arterial hypertension. 4. 2-3 mm left upper lobe pulmonary nodule was largely obscured on the prior study. Most likely benign, attention on follow-up recommended.     Electronically Signed   By: Camellia Candle M.D.  CT neck 03/26/2021 IMPRESSION: 1. Limited direct comparison to prior PET-CT (no prior neck CTs) with possible slight increase in size of a few left submandibular nodes. Otherwise, similar increased number of lymph nodes throughout the neck without substantial change in multiple mildly enlarged nodes (detailed above). 2. Oval 2.1 x 1.3 cm mass in the posterior right nasal cavity, nonspecific but potentially a polyp or retention cyst. Recommend correlation with direct inspection.     ASSESSMENT & PLAN:   43 y.o. male with  1.   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 Noted 13q deletion: 13q14.3 is at 47.67% for normal nuclei, and 35.67% with mono allelic deletion of D13S319.  -ATM for 11q22.3 revealed 81.67% with normal nuclei and 18.33% with positive nuclei for del 11q22.3   02/26/18 CT C/A/P revealed  Marked response to therapy as evidenced by decrease in size or resolution of adenopathy throughout chest, abdomen and pelvis. 2. Hepatic steatosis. Slight marginal irregularity suggests cirrhosis. 3. Enlarged pulmonary arteries, indicative of pulmonary arterial hypertension.   S/p 6 cycles of BR completed on 04/19/18 for first-line treatment  06/04/18 CT C/A/P revealed Stable CT chest without thoracic lymphadenopathy. Continued further decrease in abdominal and pelvic lymphadenopathy. No new or progressive lymphadenopathy on today's exam. 2. Hepatic steatosis. Subtle nodularity of liver contour raises the question of cirrhosis. 3. Pulmonary arterial enlargement. Pulmonary arterial hypertension a distinct concern.  2. H/o Rituxan  infusion reaction-  -Had significant flushing with rituxan  needing steroids. Not a candidate in future for rapid infusion protocol.  3.  History of some rigors and flushing with Gazyva  controlled with adding Demerol  and adjusting premedications.  4. Sleep apnea Notes he had a sleep study since his last clinic visit and diagnosis was confirmed. -uses CPAP  5. history of influenza A  6.  Mildly elevated transaminases less than 2 times upper limit of normal likely related to his recent influenza A and Tamiflu .  We will monitor-now resolved  PLAN:  -Discussed lab results from today, 01/29/2024, in detail with patient - Blood counts are stable. -Patient has no lab or clinical evidence of CLL progression at this time. - No notable toxicities from his current dose of venetoclax  at 300 mg p.o. daily. - We shall continue his current  dose of venetoclax . - He will continue his prophylactic acyclovir  given previous history of significant shingles. - Continue vitamin D  FOLLOW-UP: RTC with Dr Onesimo with labs in 4 months  The total time spent in the appointment was 20 minutes* .  All of the patient's questions were answered with apparent satisfaction. The patient knows to call the clinic with any problems, questions or concerns.   Emaline Onesimo MD MS AAHIVMS [facility] Hematology/Oncology Physician [facility]  .*Total Encounter Time as defined by the Centers for Medicare and Medicaid Services includes, in addition to the face-to-face time of a patient visit (documented in the note above) non-face-to-face time: obtaining and reviewing outside history, ordering and reviewing medications, tests or procedures, care coordination (communications with other health care professionals or caregivers) and documentation in the medical record.    I,Mitra Faeizi,acting as a Neurosurgeon for Emaline Onesimo, MD.,have documented all relevant documentation on the behalf of Emaline Onesimo, MD,as directed by  Emaline Onesimo, MD while in the presence of Emaline Onesimo, MD.  .I have reviewed the above documentation for accuracy and completeness, and I agree with the above. .Melisa Donofrio Kishore Havanna Groner MD

## 2024-01-29 ENCOUNTER — Inpatient Hospital Stay: Attending: Hematology

## 2024-01-29 ENCOUNTER — Inpatient Hospital Stay: Admitting: Hematology

## 2024-01-29 ENCOUNTER — Other Ambulatory Visit (HOSPITAL_COMMUNITY): Payer: Self-pay

## 2024-01-29 VITALS — BP 108/74 | HR 84 | Temp 97.9°F | Resp 18 | Wt 257.0 lb

## 2024-01-29 DIAGNOSIS — Z7969 Long term (current) use of other immunomodulators and immunosuppressants: Secondary | ICD-10-CM | POA: Diagnosis not present

## 2024-01-29 DIAGNOSIS — C9110 Chronic lymphocytic leukemia of B-cell type not having achieved remission: Secondary | ICD-10-CM | POA: Diagnosis not present

## 2024-01-29 DIAGNOSIS — Z79624 Long term (current) use of inhibitors of nucleotide synthesis: Secondary | ICD-10-CM | POA: Insufficient documentation

## 2024-01-29 DIAGNOSIS — G4730 Sleep apnea, unspecified: Secondary | ICD-10-CM | POA: Insufficient documentation

## 2024-01-29 DIAGNOSIS — R7401 Elevation of levels of liver transaminase levels: Secondary | ICD-10-CM | POA: Insufficient documentation

## 2024-01-29 LAB — CBC WITH DIFFERENTIAL (CANCER CENTER ONLY)
Abs Immature Granulocytes: 0.01 K/uL (ref 0.00–0.07)
Basophils Absolute: 0 K/uL (ref 0.0–0.1)
Basophils Relative: 0 %
Eosinophils Absolute: 0 K/uL (ref 0.0–0.5)
Eosinophils Relative: 0 %
HCT: 43.8 % (ref 39.0–52.0)
Hemoglobin: 15.1 g/dL (ref 13.0–17.0)
Immature Granulocytes: 0 %
Lymphocytes Relative: 20 %
Lymphs Abs: 1 K/uL (ref 0.7–4.0)
MCH: 31.7 pg (ref 26.0–34.0)
MCHC: 34.5 g/dL (ref 30.0–36.0)
MCV: 92 fL (ref 80.0–100.0)
Monocytes Absolute: 0.7 K/uL (ref 0.1–1.0)
Monocytes Relative: 14 %
Neutro Abs: 3.2 K/uL (ref 1.7–7.7)
Neutrophils Relative %: 66 %
Platelet Count: 212 K/uL (ref 150–400)
RBC: 4.76 MIL/uL (ref 4.22–5.81)
RDW: 12.6 % (ref 11.5–15.5)
WBC Count: 4.9 K/uL (ref 4.0–10.5)
nRBC: 0 % (ref 0.0–0.2)

## 2024-01-29 LAB — CMP (CANCER CENTER ONLY)
ALT: 27 U/L (ref 0–44)
AST: 30 U/L (ref 15–41)
Albumin: 4.8 g/dL (ref 3.5–5.0)
Alkaline Phosphatase: 77 U/L (ref 38–126)
Anion gap: 8 (ref 5–15)
BUN: 12 mg/dL (ref 6–20)
CO2: 27 mmol/L (ref 22–32)
Calcium: 9.8 mg/dL (ref 8.9–10.3)
Chloride: 104 mmol/L (ref 98–111)
Creatinine: 0.98 mg/dL (ref 0.61–1.24)
GFR, Estimated: >60 mL/min (ref >60)
Glucose, Bld: 93 mg/dL (ref 70–99)
Potassium: 4.7 mmol/L (ref 3.5–5.1)
Sodium: 139 mmol/L (ref 135–145)
Total Bilirubin: 0.7 mg/dL (ref 0.0–1.2)
Total Protein: 7.1 g/dL (ref 6.5–8.1)

## 2024-01-29 LAB — LACTATE DEHYDROGENASE: LDH: 136 U/L (ref 98–192)

## 2024-01-30 DIAGNOSIS — E785 Hyperlipidemia, unspecified: Secondary | ICD-10-CM | POA: Diagnosis not present

## 2024-01-30 DIAGNOSIS — G4733 Obstructive sleep apnea (adult) (pediatric): Secondary | ICD-10-CM | POA: Diagnosis not present

## 2024-01-30 DIAGNOSIS — R7303 Prediabetes: Secondary | ICD-10-CM | POA: Diagnosis not present

## 2024-01-30 DIAGNOSIS — I10 Essential (primary) hypertension: Secondary | ICD-10-CM | POA: Diagnosis not present

## 2024-01-31 ENCOUNTER — Other Ambulatory Visit: Payer: Self-pay

## 2024-01-31 NOTE — Progress Notes (Signed)
 Specialty Pharmacy Refill Coordination Note  Jordan Schroeder is a 43 y.o. male contacted today regarding refills of specialty medication(s) Venetoclax  (VENCLEXTA )   Patient requested Delivery   Delivery date: 02/07/24   Verified address: [address] [place] [address]   Medication will be filled on 02/06/24.

## 2024-02-04 ENCOUNTER — Encounter: Payer: Self-pay | Admitting: Hematology

## 2024-02-06 ENCOUNTER — Other Ambulatory Visit: Payer: Self-pay

## 2024-02-21 DIAGNOSIS — R7303 Prediabetes: Secondary | ICD-10-CM | POA: Diagnosis not present

## 2024-02-21 DIAGNOSIS — G4733 Obstructive sleep apnea (adult) (pediatric): Secondary | ICD-10-CM | POA: Diagnosis not present

## 2024-02-21 DIAGNOSIS — E785 Hyperlipidemia, unspecified: Secondary | ICD-10-CM | POA: Diagnosis not present

## 2024-02-21 DIAGNOSIS — I10 Essential (primary) hypertension: Secondary | ICD-10-CM | POA: Diagnosis not present

## 2024-02-27 ENCOUNTER — Other Ambulatory Visit: Payer: Self-pay

## 2024-02-27 ENCOUNTER — Other Ambulatory Visit: Payer: Self-pay | Admitting: Hematology

## 2024-02-27 ENCOUNTER — Other Ambulatory Visit (HOSPITAL_COMMUNITY): Payer: Self-pay

## 2024-02-27 MED ORDER — VENETOCLAX 100 MG PO TABS
300.0000 mg | ORAL_TABLET | Freq: Every day | ORAL | 1 refills | Status: DC
  Filled 2024-02-27 – 2024-03-05 (×5): qty 84, 28d supply, fill #0
  Filled 2024-03-28: qty 84, 28d supply, fill #1

## 2024-02-29 ENCOUNTER — Other Ambulatory Visit (HOSPITAL_COMMUNITY): Payer: Self-pay

## 2024-02-29 ENCOUNTER — Other Ambulatory Visit: Payer: Self-pay

## 2024-03-05 ENCOUNTER — Other Ambulatory Visit (HOSPITAL_COMMUNITY): Payer: Self-pay

## 2024-03-05 ENCOUNTER — Other Ambulatory Visit: Payer: Self-pay

## 2024-03-05 NOTE — Progress Notes (Signed)
 Specialty Pharmacy Refill Coordination Note  Spoke with Jordan Schroeder is a 43 y.o. male contacted today regarding refills of specialty medication(s) Venetoclax  (VENCLEXTA )  Doses on hand: 9 tablets/3 days  Patient requested: Delivery   Delivery date: 03/06/24   Verified address: [address] [place] [address]  Medication will be filled on 03/05/24.

## 2024-03-19 ENCOUNTER — Other Ambulatory Visit: Payer: Self-pay

## 2024-03-27 ENCOUNTER — Other Ambulatory Visit: Payer: Self-pay

## 2024-03-28 ENCOUNTER — Other Ambulatory Visit: Payer: Self-pay

## 2024-03-28 NOTE — Progress Notes (Signed)
 Specialty Pharmacy Refill Coordination Note  Jordan Schroeder is a 43 y.o. male contacted today regarding refills of specialty medication(s) Venetoclax  (VENCLEXTA )   Patient requested Delivery   Delivery date: 04/01/24   Verified address: [address] [place] [address]   Medication will be filled on 03/29/24.

## 2024-03-28 NOTE — Progress Notes (Signed)
 Specialty Pharmacy Ongoing Clinical Assessment Note  Jordan Schroeder is a 43 y.o. male who is being followed by the specialty pharmacy service for [facility] Oncology   Patient's specialty medication(s) reviewed today: Venetoclax  (VENCLEXTA )   Missed doses in the last 4 weeks: 0   Patient/Caregiver did not have any additional questions or concerns.   Therapeutic benefit summary: Patient is achieving benefit   Adverse events/side effects summary: No adverse events/side effects   Patient's therapy is appropriate to: Continue    Goals Addressed             This Visit's Progress    Achieve or maintain remission   On track    Patient is on track . Patient will maintain adherence.  Dr. Onesimo noted no evidence of CLL progression at office visit on 01/29/24.         Follow up: 6 months  [facility]

## 2024-04-19 ENCOUNTER — Other Ambulatory Visit: Payer: Self-pay | Admitting: Hematology

## 2024-04-19 ENCOUNTER — Other Ambulatory Visit: Payer: Self-pay

## 2024-04-19 MED ORDER — VENETOCLAX 100 MG PO TABS
300.0000 mg | ORAL_TABLET | Freq: Every day | ORAL | 1 refills | Status: DC
  Filled 2024-04-19: qty 120, 40d supply, fill #0
  Filled 2024-04-23 – 2024-04-25 (×2): qty 84, 28d supply, fill #0

## 2024-04-23 ENCOUNTER — Other Ambulatory Visit: Payer: Self-pay

## 2024-04-25 ENCOUNTER — Other Ambulatory Visit: Payer: Self-pay

## 2024-04-25 ENCOUNTER — Other Ambulatory Visit (HOSPITAL_COMMUNITY): Payer: Self-pay

## 2024-04-25 NOTE — Progress Notes (Signed)
 Specialty Pharmacy Refill Coordination Note  Spoke with Jordan Schroeder is a 43 y.o. male contacted today regarding refills of specialty medication(s) Venetoclax  (VENCLEXTA )  Doses on hand: 7 (21 tabs)  Patient requested: Delivery   Delivery date: 04/29/24   Verified address: [address] [place] [address]  Medication will be filled on 04/26/24.

## 2024-05-13 ENCOUNTER — Telehealth: Payer: Self-pay | Admitting: Hematology

## 2024-05-15 ENCOUNTER — Other Ambulatory Visit: Payer: Self-pay

## 2024-05-16 ENCOUNTER — Other Ambulatory Visit: Payer: Self-pay

## 2024-05-17 ENCOUNTER — Other Ambulatory Visit: Payer: Self-pay

## 2024-05-17 MED ORDER — VENETOCLAX 100 MG PO TABS
300.0000 mg | ORAL_TABLET | Freq: Every day | ORAL | 1 refills | Status: DC
  Filled 2024-05-17 – 2024-05-23 (×2): qty 84, 28d supply, fill #0
  Filled 2024-06-17: qty 84, 28d supply, fill #1

## 2024-05-20 ENCOUNTER — Inpatient Hospital Stay: Admitting: Hematology

## 2024-05-20 ENCOUNTER — Other Ambulatory Visit: Payer: Self-pay

## 2024-05-20 ENCOUNTER — Inpatient Hospital Stay

## 2024-05-20 DIAGNOSIS — C9110 Chronic lymphocytic leukemia of B-cell type not having achieved remission: Secondary | ICD-10-CM

## 2024-05-21 ENCOUNTER — Inpatient Hospital Stay: Attending: Hematology

## 2024-05-21 ENCOUNTER — Inpatient Hospital Stay: Admitting: Hematology

## 2024-05-21 VITALS — BP 111/74 | HR 81 | Temp 97.3°F | Resp 20 | Wt 259.3 lb

## 2024-05-21 DIAGNOSIS — C9110 Chronic lymphocytic leukemia of B-cell type not having achieved remission: Secondary | ICD-10-CM

## 2024-05-21 DIAGNOSIS — G4730 Sleep apnea, unspecified: Secondary | ICD-10-CM | POA: Diagnosis not present

## 2024-05-21 DIAGNOSIS — Z7969 Long term (current) use of other immunomodulators and immunosuppressants: Secondary | ICD-10-CM | POA: Diagnosis not present

## 2024-05-21 DIAGNOSIS — Z9221 Personal history of antineoplastic chemotherapy: Secondary | ICD-10-CM | POA: Diagnosis not present

## 2024-05-21 LAB — CBC WITH DIFFERENTIAL (CANCER CENTER ONLY)
Abs Immature Granulocytes: 0.01 K/uL (ref 0.00–0.07)
Basophils Absolute: 0 K/uL (ref 0.0–0.1)
Basophils Relative: 1 %
Eosinophils Absolute: 0 K/uL (ref 0.0–0.5)
Eosinophils Relative: 0 %
HCT: 42.5 % (ref 39.0–52.0)
Hemoglobin: 14.8 g/dL (ref 13.0–17.0)
Immature Granulocytes: 0 %
Lymphocytes Relative: 25 %
Lymphs Abs: 1.1 K/uL (ref 0.7–4.0)
MCH: 31.8 pg (ref 26.0–34.0)
MCHC: 34.8 g/dL (ref 30.0–36.0)
MCV: 91.2 fL (ref 80.0–100.0)
Monocytes Absolute: 0.7 K/uL (ref 0.1–1.0)
Monocytes Relative: 17 %
Neutro Abs: 2.5 K/uL (ref 1.7–7.7)
Neutrophils Relative %: 57 %
Platelet Count: 192 K/uL (ref 150–400)
RBC: 4.66 MIL/uL (ref 4.22–5.81)
RDW: 12.3 % (ref 11.5–15.5)
WBC Count: 4.4 K/uL (ref 4.0–10.5)
nRBC: 0 % (ref 0.0–0.2)

## 2024-05-21 LAB — CMP (CANCER CENTER ONLY)
ALT: 29 U/L (ref 0–44)
AST: 28 U/L (ref 15–41)
Albumin: 4.6 g/dL (ref 3.5–5.0)
Alkaline Phosphatase: 81 U/L (ref 38–126)
Anion gap: 10 (ref 5–15)
BUN: 23 mg/dL — ABNORMAL HIGH (ref 6–20)
CO2: 25 mmol/L (ref 22–32)
Calcium: 9.9 mg/dL (ref 8.9–10.3)
Chloride: 106 mmol/L (ref 98–111)
Creatinine: 1.16 mg/dL (ref 0.61–1.24)
GFR, Estimated: >60 mL/min (ref >60)
Glucose, Bld: 94 mg/dL (ref 70–99)
Potassium: 4.4 mmol/L (ref 3.5–5.1)
Sodium: 141 mmol/L (ref 135–145)
Total Bilirubin: 0.4 mg/dL (ref 0.0–1.2)
Total Protein: 6.4 g/dL — ABNORMAL LOW (ref 6.5–8.1)

## 2024-05-21 LAB — LACTATE DEHYDROGENASE: LDH: 155 U/L (ref 105–235)

## 2024-05-21 NOTE — Progress Notes (Signed)
 Jordan Schroeder

## 2024-05-21 NOTE — Progress Notes (Signed)
 HEMATOLOGY ONCOLOGY PROGRESS NOTE  Date of service: 05/21/2024  Patient Care Team: Waylan Almarie SAUNDERS, MD as PCP - General (Family Medicine)  CHIEF COMPLAINT/PURPOSE OF CONSULTATION: Follow-up for continued evaluation and management of CLL  HISTORY OF PRESENTING ILLNESS: See previous note for details on initial presentation (10/13/2017).    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)   11/30/2017 - 04/20/2018 Chemotherapy   The patient had dexamethasone  (DECADRON ) 4 MG tablet, 8 mg, Oral, Daily, 1 of 1 cycle, Start date: 10/30/2017, End date: -- palonosetron  (ALOXI ) injection 0.25 mg, 0.25 mg, Intravenous,  Once, 6 of 6 cycles Administration: 0.25 mg (11/30/2017), 0.25 mg (03/22/2018), 0.25 mg (12/28/2017), 0.25 mg (04/19/2018), 0.25 mg (01/25/2018), 0.25 mg (02/22/2018) riTUXimab  (RITUXAN ) 900 mg in sodium chloride  0.9 % 250 mL (2.6471 mg/mL) infusion, 375 mg/m2 = 900 mg, Intravenous,  Once, 6 of 6 cycles Administration: 900 mg (11/30/2017), 900 mg (03/22/2018), 900 mg (12/28/2017), 900 mg (04/19/2018), 900 mg (01/25/2018), 900 mg (02/22/2018) bendamustine  (BENDEKA ) 225 mg in sodium chloride  0.9 % 50 mL (3.8136 mg/mL) chemo infusion, 90 mg/m2 = 225 mg, Intravenous,  Once, 6 of 6 cycles Administration: 225 mg (11/30/2017), 225 mg (12/01/2017), 225 mg (03/22/2018), 225 mg (03/23/2018), 225 mg (12/28/2017), 225 mg (12/29/2017), 225 mg (04/19/2018), 225 mg (04/20/2018), 225 mg (01/25/2018), 225 mg (01/26/2018), 225 mg (02/22/2018), 225 mg (02/23/2018)  for chemotherapy treatment.    CLL (chronic lymphocytic leukemia) (HCC)  05/12/2021 Initial Diagnosis   CLL (chronic lymphocytic leukemia) (HCC)   06/02/2021 -  Chemotherapy   Patient is on Treatment Plan : LYMPHOMA CLL/SLL Venetoclax  + Obinutuzumab  q28d       INTERVAL HISTORY:  Jordan Schroeder  is a 43 y.o. male who is here today for continued evaluation and management of CLL.   he was last seen by me on 01/29/2024; at the time he did not have any concerns and was doing well.   Today, he has no new concerns. He is tolerating venetoclax  well.  Denies any new infection issues, fevers/chills, back pain, abdominal pain, new bone pains, new lumps/bumps, leg swelling, bowel/urinary changes, or SOB.   REVIEW OF SYSTEMS:   10 Point review of systems of done and is negative except as noted above.  MEDICAL HISTORY Past Medical History:  Diagnosis Date   High cholesterol    Hypertension    Leukemia (HCC)     SURGICAL HISTORY No past surgical history on file.  SOCIAL HISTORY Social History   Tobacco Use   Smoking status: Never   Smokeless tobacco: Never  Vaping Use   Vaping status: Never Used  Substance Use Topics   Alcohol use: Not Currently   Drug use: Never    Social History   Social History Narrative   Not on file    SOCIAL DRIVERS OF HEALTH SDOH Screenings   Social Connections: Unknown (11/16/2021)   Received from [facility]  Tobacco Use: Low Risk  (06/23/2022)   Received from [facility]     FAMILY HISTORY No family history on file.   ALLERGIES: is allergic to gazyva  [obinutuzumab ] and rituximab .  MEDICATIONS  Current Outpatient Medications  Medication Sig Dispense Refill   acyclovir  (ZOVIRAX ) 400 MG tablet Take 1 tablet (400 mg total) by mouth daily. (Patient not taking: Reported on 08/13/2019) 30 tablet 3   azithromycin  (ZITHROMAX  Z-PAK) 250 MG tablet 2  tabs (500mg ) on day 1 and then 1 tab (250mg ) PO daily for 4 days 6 each 0   fenofibrate micronized (LOFIBRA) 67 MG capsule Take 67 mg by mouth daily before breakfast.     lisinopril (PRINIVIL,ZESTRIL) 10 MG tablet Take 10 mg by mouth daily.     ondansetron  (ZOFRAN -ODT) 4 MG disintegrating tablet 4mg  ODT q4 hours prn nausea/vomit 10 tablet 0   sertraline (ZOLOFT) 100 MG tablet Take 100 mg by mouth  daily.     venetoclax  (VENCLEXTA ) 100 MG tablet Take 3 tablets (300 mg total) by mouth daily. Tablets should be swallowed whole with a meal and a full glass of water. 84 tablet 1   VITAMIN D PO Take 2,000 Units by mouth.     No current facility-administered medications for this visit.    PHYSICAL EXAMINATION: ECOG PERFORMANCE STATUS: 1 - Symptomatic but completely ambulatory VITALS: Vitals:   05/21/24 1440  BP: 111/74  Pulse: 81  Resp: 20  Temp: (!) 97.3 F (36.3 C)  SpO2: 96%   Filed Weights   05/21/24 1440 05/21/24 1446  Weight: 259 lb 4.8 oz (117.6 kg) 259 lb 4.8 oz (117.6 kg)   Body mass index is 39.43 kg/m.  GENERAL: alert, in no acute distress and comfortable SKIN: no acute rashes, no significant lesions EYES: conjunctiva are pink and non-injected, sclera anicteric OROPHARYNX: MMM, no exudates, no oropharyngeal erythema or ulceration NECK: supple, no JVD LYMPH:  no palpable lymphadenopathy in the cervical, axillary or inguinal regions LUNGS: clear to auscultation b/l with normal respiratory effort HEART: regular rate & rhythm ABDOMEN:  normoactive bowel sounds , non tender, not distended, no hepatosplenomegaly Extremity: no pedal edema PSYCH: alert & oriented x 3 with fluent speech NEURO: no focal motor/sensory deficits  LABORATORY DATA:   I have reviewed the data as listed     Latest Ref Rng & Units 05/21/2024    2:19 PM 01/29/2024    2:39 PM 10/04/2023    1:21 PM  CBC EXTENDED  WBC 4.0 - 10.5 K/uL 4.4  4.9  4.3   RBC 4.22 - 5.81 MIL/uL 4.66  4.76  4.63   Hemoglobin 13.0 - 17.0 g/dL 85.1  84.8  85.1   HCT 39.0 - 52.0 % 42.5  43.8  43.9   Platelets 150 - 400 K/uL 192  212  216   NEUT# 1.7 - 7.7 K/uL 2.5  3.2  2.5   Lymph# 0.7 - 4.0 K/uL 1.1  1.0  1.0        Latest Ref Rng & Units 05/21/2024    2:19 PM 01/29/2024    2:39 PM 10/04/2023    1:21 PM  CMP  Glucose 70 - 99 mg/dL 94  93  894   BUN 6 - 20 mg/dL 23  12  17    Creatinine 0.61 - 1.24 mg/dL 8.83   9.01  8.98   Sodium 135 - 145 mmol/L 141  139  139   Potassium 3.5 - 5.1 mmol/L 4.4  4.7  4.1   Chloride 98 - 111 mmol/L 106  104  102   CO2 22 - 32 mmol/L 25  27  31    Calcium 8.9 - 10.3 mg/dL 9.9  9.8  9.8   Total Protein 6.5 - 8.1 g/dL 6.4  7.1  6.7   Total Bilirubin 0.0 - 1.2 mg/dL 0.4  0.7  0.5   Alkaline Phos 38 - 126 U/L 81  77  77   AST 15 - 41  U/L 28  30  24    ALT 0 - 44 U/L 29  27  24      08/25/2021 Uric acid 3.8   Component     Latest Ref Rng & Units 10/30/2017  HIV Screen 4th Generation wRfx     Non Reactive Non Reactive  HCV Ab     0.0 - 0.9 s/co ratio <0.1  Hepatitis B Surface Ag     Negative Negative  Hep B Core Ab, Tot     Negative Negative         10/25/17 Tissue Flow Cytometry:      10/25/17 Needle/core Bx:    FISH Oncology  Order: [id number]  Status:  Final result   Visible to patient:  No (Not Released) Next appt:  11/07/2017 at 09:00 AM in Radiology ([facility] PET) Dx:  Lymphocytosis  Component 67mo ago  Specimen Type Comment:   Comment: BLOOD  Cells Counted 200   Cells Analyzed 200   FISH Result Comment:   Comment: NORMAL:  NO CYCLIN D1 OR IGH GENE REARRANGEMENT OBSERVED  Interpretation Comment:   Comment: (NOTE)               nuc ish 11q13(CCND1x2),14q32(IGHx2)[200]       The fluorescence in situ hybridization (FISH) result  was normal. Dual color dual fusion DNA FISH probes targeting  the cyclin D1 gene (CCND1 or BCL1)([employer] and the  heavy chain immunoglobulin gene (IgH) at 14q32 showed two  hybridization signals each with no fusion in all cells  examined. Thus, there was NO apparent rearrangement of the  primary genes involved with mantle cell lymphoma, and to a  lesser extent myeloma.  SABRA          RADIOGRAPHIC STUDIES: I have personally reviewed the radiological images as listed and agreed with the findings in the report.   CT chest abdomen pelvis 03/26/2021: IMPRESSION: 1. Interval development of bulky lymphadenopathy in the  chest, abdomen, and pelvis. Imaging features compatible with recurrent lymphoma. 2. No hepatosplenomegaly. 3. Enlargement of the pulmonary outflow tract and main pulmonary arteries suggests pulmonary arterial hypertension. 4. 2-3 mm left upper lobe pulmonary nodule was largely obscured on the prior study. Most likely benign, attention on follow-up recommended.     Electronically Signed   By: Camellia Candle M.D.   CT neck 03/26/2021 IMPRESSION: 1. Limited direct comparison to prior PET-CT (no prior neck CTs) with possible slight increase in size of a few left submandibular nodes. Otherwise, similar increased number of lymph nodes throughout the neck without substantial change in multiple mildly enlarged nodes (detailed above). 2. Oval 2.1 x 1.3 cm mass in the posterior right nasal cavity, nonspecific but potentially a polyp or retention cyst. Recommend correlation with direct inspection.    ASSESSMENT & PLAN:   43 y.o. male with   1.  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 Noted 13q deletion: 13q14.3 is at 47.67% for normal nuclei, and 35.67% with mono allelic deletion of D13S319.  -ATM for 11q22.3 revealed 81.67% with normal nuclei and 18.33% with positive nuclei for del 11q22.3    02/26/18 CT C/A/P revealed  Marked response to therapy as evidenced by decrease in size or resolution of adenopathy throughout chest, abdomen and pelvis. 2. Hepatic steatosis. Slight marginal irregularity suggests cirrhosis. 3. Enlarged pulmonary arteries, indicative of pulmonary arterial hypertension.    S/p 6 cycles of BR completed on 04/19/18 for first-line treatment  06/04/18 CT C/A/P revealed Stable CT chest without thoracic lymphadenopathy. Continued further decrease in abdominal and pelvic lymphadenopathy. No new or progressive lymphadenopathy on today's exam. 2. Hepatic steatosis. Subtle nodularity of liver contour raises the  question of cirrhosis. 3. Pulmonary arterial enlargement. Pulmonary arterial hypertension a distinct concern.   2. H/o Rituxan  infusion reaction-  -Had significant flushing with rituxan  needing steroids. Not a candidate in future for rapid infusion protocol.   3.  History of some rigors and flushing with Gazyva  controlled with adding Demerol  and adjusting premedications.   4. Sleep apnea Notes he had a sleep study since his last clinic visit and diagnosis was confirmed. -uses CPAP   5. history of influenza A  6.  Mildly elevated transaminases less than 2 times upper limit of normal likely related to his recent influenza A and Tamiflu .  We will monitor-now resolved  PLAN: - Discussed lab results on 05/21/2024 in detail with patient: -Blood chemistry is normal -CBC is normal No evidence of progression of CLL  No notable toxicities from current dose of venetoclax . Continue venetoclax  @ 300mg  po daily -recommends receiving influenza vaccine soon  -RTC with Dr Onesimo with labs in 4 months  FOLLOW-UP in 4 months for labs and follow-up with Dr. Onesimo.  The total time spent in the appointment was 20 minutes* .  All of the patient's questions were answered and the patient knows to call the clinic with any problems, questions, or concerns.  Emaline Onesimo MD MS AAHIVMS [facility] Hematology/Oncology Physician [facility]  *Total Encounter Time as defined by the Centers for Medicare and Medicaid Services includes, in addition to the face-to-face time of a patient visit (documented in the note above) non-face-to-face time: obtaining and reviewing outside history, ordering and reviewing medications, tests or procedures, care coordination (communications with other health care professionals or caregivers) and documentation in the medical record.  I, Alan Blowers, acting as a neurosurgeon for Emaline Onesimo, MD.,have documented all relevant documentation on the behalf of Emaline Onesimo, MD,as  directed by  Emaline Onesimo, MD while in the presence of Emaline Onesimo, MD.  I have reviewed the above documentation for accuracy and completeness, and I agree with the above.  Elzora Cullins, MD

## 2024-05-23 ENCOUNTER — Other Ambulatory Visit: Payer: Self-pay

## 2024-05-23 ENCOUNTER — Other Ambulatory Visit (HOSPITAL_COMMUNITY): Payer: Self-pay

## 2024-05-24 ENCOUNTER — Other Ambulatory Visit (HOSPITAL_COMMUNITY): Payer: Self-pay

## 2024-05-25 ENCOUNTER — Encounter (HOSPITAL_COMMUNITY): Payer: Self-pay

## 2024-05-27 ENCOUNTER — Other Ambulatory Visit: Payer: Self-pay

## 2024-05-27 NOTE — Progress Notes (Signed)
 Specialty Pharmacy Refill Coordination Note  Jordan Schroeder is a 43 y.o. male contacted today regarding refills of specialty medication(s) Venetoclax  (VENCLEXTA )   Patient requested Delivery   Delivery date: 05/28/24   Verified address: [address] [place]     Medication will be filled on: 05/27/24

## 2024-05-28 ENCOUNTER — Other Ambulatory Visit (HOSPITAL_COMMUNITY): Payer: Self-pay

## 2024-05-28 ENCOUNTER — Encounter: Payer: Self-pay | Admitting: Hematology

## 2024-05-29 ENCOUNTER — Other Ambulatory Visit (HOSPITAL_COMMUNITY): Payer: Self-pay

## 2024-06-17 ENCOUNTER — Other Ambulatory Visit: Payer: Self-pay

## 2024-06-17 ENCOUNTER — Other Ambulatory Visit (HOSPITAL_COMMUNITY): Payer: Self-pay

## 2024-06-17 NOTE — Progress Notes (Signed)
 Specialty Pharmacy Refill Coordination Note  Jordan Schroeder is a 43 y.o. male contacted today regarding refills of specialty medication(s) Venetoclax  (VENCLEXTA )   Patient requested (Patient-Rptd) Delivery   Delivery date: 06/20/24   Verified address: (Patient-Rptd) [address]  [place]  [phone]   Medication will be filled on: 06/19/24

## 2024-06-19 ENCOUNTER — Other Ambulatory Visit: Payer: Self-pay

## 2024-07-08 ENCOUNTER — Other Ambulatory Visit: Payer: Self-pay

## 2024-07-08 MED ORDER — VENETOCLAX 100 MG PO TABS
300.0000 mg | ORAL_TABLET | Freq: Every day | ORAL | 1 refills | Status: AC
  Filled 2024-07-09 – 2024-07-11 (×3): qty 84, 28d supply, fill #0

## 2024-07-09 ENCOUNTER — Other Ambulatory Visit: Payer: Self-pay

## 2024-07-10 ENCOUNTER — Other Ambulatory Visit: Payer: Self-pay

## 2024-07-10 ENCOUNTER — Telehealth: Payer: Self-pay

## 2024-07-10 ENCOUNTER — Encounter: Payer: Self-pay | Admitting: Hematology

## 2024-07-10 ENCOUNTER — Other Ambulatory Visit (HOSPITAL_COMMUNITY): Payer: Self-pay

## 2024-07-10 ENCOUNTER — Other Ambulatory Visit: Payer: Self-pay | Admitting: Pharmacy Technician

## 2024-07-10 NOTE — Telephone Encounter (Addendum)
 Oral Oncology Patient Advocate Encounter  Prior Authorization for Venclexta  has been approved.    PA# [id number] Effective dates: 07/10/24 through 07/10/25  Copay is $100.   Lucie Lamer, CPhT [place]  [facility] Oncology Pharmacy Patient Advocate Specialist [address] [facility] [place] Phone: [phone]  Fax: [phone] [email or web address]

## 2024-07-10 NOTE — Telephone Encounter (Signed)
 Oral Oncology Patient Advocate Encounter   Received notification that prior authorization for venetoclax  (VENCLEXTA ) 100 MG tablet  is required.   PA submitted on 07/10/24 Key [id number] Status is pending     Lucie Lamer, CPhT [place]  [facility] Oncology Pharmacy Patient Advocate Specialist [facility] [place] Phone: [phone]  Fax: [phone] [email or web address]

## 2024-07-11 ENCOUNTER — Other Ambulatory Visit (HOSPITAL_COMMUNITY): Payer: Self-pay

## 2024-07-11 ENCOUNTER — Other Ambulatory Visit: Payer: Self-pay

## 2024-07-12 ENCOUNTER — Other Ambulatory Visit: Payer: Self-pay

## 2024-07-15 ENCOUNTER — Other Ambulatory Visit (HOSPITAL_COMMUNITY): Payer: Self-pay

## 2024-07-15 NOTE — Progress Notes (Signed)
 Specialty Pharmacy Refill Coordination Note  Jordan Schroeder is a 44 y.o. male contacted today regarding refills of specialty medication(s) Venetoclax  (VENCLEXTA )   Patient requested Delivery   Delivery date: 07/23/24   Verified address: [address]  [place]  [phone]   Medication will be filled on: 07/22/24

## 2024-07-18 ENCOUNTER — Other Ambulatory Visit: Payer: Self-pay

## 2024-09-20 ENCOUNTER — Inpatient Hospital Stay

## 2024-09-20 ENCOUNTER — Inpatient Hospital Stay: Admitting: Hematology
